# Patient Record
Sex: Male | Born: 1945 | Race: White | Hispanic: No | Marital: Married | Smoking: Former smoker
Health system: Southern US, Community
[De-identification: ages and names within clinical notes are randomized; demographics above are authoritative.]

## PROBLEM LIST (undated history)

## (undated) DIAGNOSIS — I10 Essential (primary) hypertension: Secondary | ICD-10-CM

## (undated) DIAGNOSIS — N529 Male erectile dysfunction, unspecified: Secondary | ICD-10-CM

## (undated) HISTORY — DX: Male erectile dysfunction, unspecified: N52.9

## (undated) HISTORY — PX: EYE SURGERY: SHX253

---

## 2001-07-01 ENCOUNTER — Encounter: Admission: RE | Admit: 2001-07-01 | Discharge: 2001-09-29 | Payer: Self-pay | Admitting: Neurology

## 2001-07-15 ENCOUNTER — Ambulatory Visit (HOSPITAL_COMMUNITY): Admission: RE | Admit: 2001-07-15 | Discharge: 2001-07-15 | Payer: Self-pay | Admitting: Neurology

## 2001-07-15 ENCOUNTER — Encounter: Payer: Self-pay | Admitting: Neurology

## 2001-07-20 ENCOUNTER — Encounter: Payer: Self-pay | Admitting: Radiology

## 2001-07-20 ENCOUNTER — Ambulatory Visit (HOSPITAL_COMMUNITY): Admission: RE | Admit: 2001-07-20 | Discharge: 2001-07-20 | Payer: Self-pay | Admitting: Radiology

## 2003-11-21 HISTORY — PX: HERNIA REPAIR: SHX51

## 2008-12-14 ENCOUNTER — Ambulatory Visit: Payer: Self-pay | Admitting: Family Medicine

## 2008-12-14 DIAGNOSIS — Z87891 Personal history of nicotine dependence: Secondary | ICD-10-CM | POA: Insufficient documentation

## 2008-12-15 ENCOUNTER — Encounter: Payer: Self-pay | Admitting: Family Medicine

## 2008-12-17 LAB — CONVERTED CEMR LAB
ALT: 21 units/L (ref 0–53)
AST: 14 units/L (ref 0–37)
Albumin: 4.6 g/dL (ref 3.5–5.2)
Alkaline Phosphatase: 76 units/L (ref 39–117)
BUN: 19 mg/dL (ref 6–23)
CO2: 23 meq/L (ref 19–32)
Calcium: 9.5 mg/dL (ref 8.4–10.5)
Chloride: 107 meq/L (ref 96–112)
Cholesterol: 184 mg/dL (ref 0–200)
Creatinine, Ser: 0.97 mg/dL (ref 0.40–1.50)
Glucose, Bld: 124 mg/dL — ABNORMAL HIGH (ref 70–99)
HDL: 50 mg/dL (ref >39)
LDL Cholesterol: 110 mg/dL — ABNORMAL HIGH (ref 0–99)
PSA, Free Pct: 23 — ABNORMAL LOW (ref >25)
PSA, Free: 0.3 ng/mL
PSA: 1.3 ng/mL (ref 0.10–4.00)
Potassium: 4.6 meq/L (ref 3.5–5.3)
Sodium: 142 meq/L (ref 135–145)
Total Bilirubin: 0.7 mg/dL (ref 0.3–1.2)
Total CHOL/HDL Ratio: 3.7
Total Protein: 7.1 g/dL (ref 6.0–8.3)
Triglycerides: 122 mg/dL (ref <150)
VLDL: 24 mg/dL (ref 0–40)

## 2008-12-25 ENCOUNTER — Encounter: Admission: RE | Admit: 2008-12-25 | Discharge: 2008-12-25 | Payer: Self-pay | Admitting: Family Medicine

## 2009-01-01 ENCOUNTER — Encounter: Payer: Self-pay | Admitting: Family Medicine

## 2009-01-01 DIAGNOSIS — D126 Benign neoplasm of colon, unspecified: Secondary | ICD-10-CM | POA: Insufficient documentation

## 2009-01-01 DIAGNOSIS — K5730 Diverticulosis of large intestine without perforation or abscess without bleeding: Secondary | ICD-10-CM | POA: Insufficient documentation

## 2009-02-15 ENCOUNTER — Ambulatory Visit: Payer: Self-pay | Admitting: Family Medicine

## 2009-02-15 LAB — CONVERTED CEMR LAB
Blood Glucose, Fasting: 126 mg/dL
Hgb A1c MFr Bld: 6.1 %

## 2009-03-05 ENCOUNTER — Ambulatory Visit: Payer: Self-pay | Admitting: Family Medicine

## 2009-03-05 LAB — CONVERTED CEMR LAB: Blood Glucose, Fasting: 129 mg/dL

## 2009-05-07 ENCOUNTER — Ambulatory Visit: Payer: Self-pay | Admitting: Family Medicine

## 2009-05-07 LAB — CONVERTED CEMR LAB: Blood Glucose, AC Bkfst: 123 mg/dL

## 2009-06-07 ENCOUNTER — Telehealth: Payer: Self-pay | Admitting: Family Medicine

## 2009-09-06 ENCOUNTER — Ambulatory Visit: Payer: Self-pay | Admitting: Family Medicine

## 2009-09-06 LAB — CONVERTED CEMR LAB: Blood Glucose, AC Bkfst: 120 mg/dL

## 2010-01-07 ENCOUNTER — Ambulatory Visit: Payer: Self-pay | Admitting: Family Medicine

## 2010-01-07 DIAGNOSIS — E119 Type 2 diabetes mellitus without complications: Secondary | ICD-10-CM | POA: Insufficient documentation

## 2010-01-07 LAB — CONVERTED CEMR LAB
Albumin/Creatinine Ratio, Urine, POC: <30
Blood Glucose, AC Bkfst: 129 mg/dL
Creatinine,U: 200 mg/dL
Hgb A1c MFr Bld: 6 %
Microalbumin U total vol: 30 mg/L

## 2010-01-10 LAB — CONVERTED CEMR LAB
Albumin: 4.4 g/dL (ref 3.5–5.2)
CO2: 25 meq/L (ref 19–32)
Cholesterol: 175 mg/dL (ref 0–200)
Glucose, Bld: 123 mg/dL — ABNORMAL HIGH (ref 70–99)
Sodium: 142 meq/L (ref 135–145)
Total Bilirubin: 0.5 mg/dL (ref 0.3–1.2)
Total Protein: 6.7 g/dL (ref 6.0–8.3)
Triglycerides: 201 mg/dL — ABNORMAL HIGH (ref <150)
VLDL: 40 mg/dL (ref 0–40)

## 2010-09-20 ENCOUNTER — Ambulatory Visit: Payer: Self-pay | Admitting: Family Medicine

## 2010-09-21 LAB — CONVERTED CEMR LAB
BUN: 15 mg/dL (ref 6–23)
CO2: 29 meq/L (ref 19–32)
Chloride: 102 meq/L (ref 96–112)
Glucose, Bld: 116 mg/dL — ABNORMAL HIGH (ref 70–99)
Potassium: 5.1 meq/L (ref 3.5–5.3)

## 2010-10-05 ENCOUNTER — Telehealth: Payer: Self-pay | Admitting: Family Medicine

## 2010-11-23 ENCOUNTER — Ambulatory Visit: Admit: 2010-11-23 | Payer: Self-pay | Admitting: Family Medicine

## 2010-12-20 NOTE — Assessment & Plan Note (Signed)
Summary: f/u DM   Vital Signs:  Patient profile:   65 year old male Height:      70.25 inches Weight:      171 pounds BMI:     20.16 O2 Sat:      96 % on Room air Pulse rate:   66 / minute BP sitting:   119 / 79  (left arm) Cuff size:   regular  Vitals Entered By: Payton Spark CMA (September 20, 2010 8:30 AM)  O2 Flow:  Room air CC: Overdue for OV and fasting labs.    Primary Care Provider:  Seymour Bars DO  CC:  Overdue for OV and fasting labs. .  History of Present Illness: 65 yo WM presents for f/u T2DM.  He was started on Metformin 2 x a day this year.  He has fair diet control.  His AM fastings are 100-120.  He is not exercising much.  Denies blurry vision or tingling in his hands or feet.  No frequent urination.  Had a normal eye exam 8 mos ago.  Denies CP but has DOE.  He is smoking 1 ppd.  He is not ready to quit.    Has not had any stress testing done or PAD screening.    Current Medications (verified): 1)  Inderal La 60 Mg Xr24h-Cap (Propranolol Hcl) .Marland Kitchen.. 1 Tab By Mouth Daily 2)  Viagra 100 Mg Tabs (Sildenafil Citrate) .Marland Kitchen.. 1 Tab By Mouth X 1 Prn 3)  Adult Aspirin Ec Low Strength 81 Mg Tbec (Aspirin) .... Take 1 Tablet By Mouth Once A Day 4)  Mens Multivitamin Plus  Tabs (Multiple Vitamins-Minerals) .... Take 1 Tablet By Mouth Once A Day 5)  Glucometer of Choice With Test Strips .... Use As Directed 6)  Accu-Chek Multiclix Lancets  Misc (Lancets) .... Use As Directed Once Daily 7)  Accu-Check Aviva Test Strips .... Use Daily As Directed 8)  Metformin Hcl 850 Mg Tabs (Metformin Hcl) .Marland Kitchen.. 1 Tab By Mouth Two Times A Day  Allergies (verified): No Known Drug Allergies  Past History:  Past Medical History: Reviewed history from 01/07/2010 and no changes required. 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 Has a grown daughter in [place]. Smokes 1 ppd x 40 yrs.-- working on quitting.   Walks 45 min 4  x a wk.  Review of Systems      See HPI  Physical Exam  General:  alert, well-developed, well-nourished, and well-hydrated.   Head:  normocephalic and atraumatic.   Eyes:  pupils equal, pupils round, and pupils reactive to light.   Mouth:  pharynx pink and moist.   Neck:  no masses.  no audible carotid bruits Lungs:  Normal respiratory effort, chest expands symmetrically. Lungs are clear to auscultation, no crackles or wheezes. Heart:  Normal rate and regular rhythm. S1 and S2 normal without gallop, murmur, click, rub or other extra sounds. Extremities:  no E/C/C Skin:  color normal.    Diabetes Management Exam:    Foot Exam (with socks and/or shoes not present):       Sensory-Monofilament:          Left foot: normal          Right foot: normal       Inspection:          Left foot: normal          Right foot: normal   Impression &  Recommendations:  Problem # 1:  DM (ICD-250.00) Continue metformin.  Monofilament done today.  PNX and Flu shot given today.  BP at goal with hx of neg urine micro (not on ACEi). Eye exam UTD.  Continue to work on diabetic diet.  Will update BMP for new start Metformin. RTC for a CPE -- due for stress testing and PAD screening. His updated medication list for this problem includes:    Adult Aspirin Ec Low Strength 81 Mg Tbec (Aspirin) .Marland Kitchen... Take 1 tablet by mouth once a day    Metformin Hcl 850 Mg Tabs (Metformin hcl) .Marland Kitchen... 1 tab by mouth two times a day  Orders: T-Basic Metabolic Panel [phone]) T- Hemoglobin A1C [phone])  Labs Reviewed: Creat: 0.94 (01/07/2010)   Microalbumin: 30 (01/07/2010) Reviewed HgBA1c results: 6.0 (01/07/2010)  6.1 (02/15/2009)  Problem # 2:  SMOKER (ICD-305.1) Counseled again on smoking cessation x 5 min.  He is not ready to quit at this point. The following medications were removed from the medication list:    Chantix Starting Month Pak 0.5 Mg X 11 & 1 Mg X 42 Tabs (Varenicline tartrate) .Marland Kitchen... Take as  directed    Chantix Continuing Month Pak 1 Mg Tabs (Varenicline tartrate) .Marland Kitchen... Take as directed  Complete Medication List: 1)  Inderal La 60 Mg Xr24h-cap (Propranolol hcl) .Marland Kitchen.. 1 tab by mouth daily 2)  Adult Aspirin Ec Low Strength 81 Mg Tbec (Aspirin) .... Take 1 tablet by mouth once a day 3)  Mens Multivitamin Plus Tabs (Multiple vitamins-minerals) .... Take 1 tablet by mouth once a day 4)  Accu-chek Multiclix Lancets Misc (Lancets) .... Use as directed once daily 5)  Accu-check Aviva Test Strips  .... Use daily as directed 6)  Metformin Hcl 850 Mg Tabs (Metformin hcl) .Marland Kitchen.. 1 tab by mouth two times a day 7)  Lovastatin 20 Mg Tabs (Lovastatin) .Marland Kitchen.. 1 tab by mouth qhs  Other Orders: Pneumococcal Vaccine (29562) Admin 1st Vaccine (13086) Admin 1st Vaccine (57846) Flu Vaccine 65yrs + (96295)  Patient Instructions: 1)  BMP today. 2)  Will call you w/ results tomorrow. 3)  Add Lovastatin -- 1 tab each night for cholesterol. 4)  Pneumovax and Flu shot today. 5)  Cut back on smoking. 6)  Schedule a PHYSICAL/ EKG in 3 mos. Prescriptions: LOVASTATIN 20 MG TABS (LOVASTATIN) 1 tab by mouth qhs  #30 x 3   Entered and Authorized by:   Seymour Bars DO   Signed by:   Seymour Bars DO on 09/20/2010   Method used:   Electronically to        [facility] [phone]* (retail)       [address] [place], [place]  [address]       Ph: [phone]       Fax: [phone]   RxID:   [id number] ACCU-CHECK AVIVA TEST STRIPS use daily as directed  #30 x 6   Entered and Authorized by:   Seymour Bars DO   Signed by:   Seymour Bars DO on 09/20/2010   Method used:   Printed then faxed to ...       [address] [phone]* (retail)       [address] [place], [place]  [address]       Ph: [phone]       Fax: [phone]   RxID:   [id number] METFORMIN HCL 850 MG TABS (METFORMIN HCL)  1 tab by mouth two times a day  #60 x 6   Entered and Authorized by:   Seymour Bars DO   Signed by:   Seymour Bars DO on 09/20/2010   Method used:   Electronically to        [facility] [phone]* (retail)       [address] [place], [place]  [address]       Ph: [phone]       Fax: [phone]   RxID:   [id number]    Orders Added: 1)  T-Basic Metabolic Panel [phone] 2)  T- Hemoglobin A1C [83036-23375] 3)  Pneumococcal Vaccine [90732] 4)  Admin 1st Vaccine [90471] 5)  Admin 1st Vaccine [90471] 6)  Flu Vaccine 68yrs + [25956] 7)  Est. Patient Level IV [38756]   Immunizations Administered:  Pneumonia Vaccine:    Vaccine Type: Pneumovax    Site: right deltoid    Dose: 0.5 ml    Route: IM    Given by: Payton Spark CMA    Exp. Date: 02/06/2012    Lot #: [id number]    VIS given: 10/25/09 version given September 20, 2010.   Immunizations Administered:  Pneumonia Vaccine:    Vaccine Type: Pneumovax    Site: right deltoid    Dose: 0.5 ml    Route: IM    Given by: Payton Spark CMA    Exp. Date: 02/06/2012    Lot #: [id number]    VIS given: 10/25/09 version given September 20, 2010. Flu Vaccine Consent Questions     Do you have a history of severe allergic reactions to this vaccine? no    Any prior history of allergic reactions to egg and/or gelatin? no    Do you have a sensitivity to the preservative Thimersol? no    Do you have a past history of Guillan-Barre Syndrome? no    Do you currently have an acute febrile illness? no    Have you ever had a severe reaction to latex? no    Vaccine information given and explained to patient? yes    Are you currently pregnant? no    Lot Number:[id number]   Exp Date:05/20/2011   Site Given  Left Deltoid IMa    VIS given: 10/25/09 version given September 20, 2010.     Marland Kitchenlbflu

## 2010-12-20 NOTE — Progress Notes (Signed)
Summary: Wants Chantix  Phone Note Call from Patient Call back at Home Phone [phone]   Caller: Patient Call For: Seymour Bars DO Summary of Call: Pt calls and ask if you will rx him Chantix so he can stop smoking. Please advise Initial call taken by: Kathlene November LPN,  October 05, 2010 12:58 PM  Follow-up for Phone Call        pick up starting month pack for month 1 and continuing month pack for months 2-4.  Call if any problems. Follow-up by: Seymour Bars DO,  October 05, 2010 1:00 PM    New/Updated Medications: CHANTIX STARTING MONTH PAK 0.5 MG X 11 & 1 MG X 42 TABS (VARENICLINE TARTRATE) take as directed CHANTIX CONTINUING MONTH PAK 1 MG TABS (VARENICLINE TARTRATE) take as directed Prescriptions: CHANTIX CONTINUING MONTH PAK 1 MG TABS (VARENICLINE TARTRATE) take as directed  #1 x 2   Entered and Authorized by:   Seymour Bars DO   Signed by:   Seymour Bars DO on 10/05/2010   Method used:   Electronically to        [facility] [phone]* (retail)       [address] [place], [place]  [address]       Ph: [phone]       Fax: [phone]   RxID:   [id number] CHANTIX STARTING MONTH PAK 0.5 MG X 11 & 1 MG X 42 TABS (VARENICLINE TARTRATE) take as directed  #1 x 0   Entered and Authorized by:   Seymour Bars DO   Signed by:   Seymour Bars DO on 10/05/2010   Method used:   Electronically to        [facility] [phone]* (retail)       [address] [place], [place]  [address]       Ph: [phone]       Fax: [phone]   RxID:   [id number]   Appended Document: Wants Chantix 10/05/2010- Pt notifeid of above instructions and that med sent to pharmacy. KJ LPN

## 2010-12-20 NOTE — Assessment & Plan Note (Signed)
Summary: DM/ smoking   Vital Signs:  Patient profile:   65 year old male Height:      70.25 inches Weight:      173 pounds BMI:     24.74 O2 Sat:      95 % on Room air Temp:     98.5 degrees F oral Pulse rate:   72 / minute BP sitting:   115 / 71  (right arm) Cuff size:   regular  Vitals Entered BySelena Batten Johnson/April (January 07, 2010 8:09 AM)  O2 Flow:  Room air CC: f/u on meds   Primary Care Provider:  Seymour Bars DO  CC:  f/u on meds.  History of Present Illness: 65 yo WM presents for f/u pre-diabetes.  He admits to not making any changes in his diet.  Plans to start exercising outside more.  He tried Chantix for 2-3 months.  He did well while on it but has relapsed back to 1/3 ppd.  He wants to try to quit smoking again.  His fasting sugars has been running 110s.  He has been taking Metformin 500 mg two times a day.  He denies CP or DOE.  Had a dilated eye exam in [place] 3 mos ago.  He denies blurry vision, polyuria or paresthesias.    Current Medications (verified): 1)  Inderal La 60 Mg Xr24h-Cap (Propranolol Hcl) .Marland Kitchen.. 1 Tab By Mouth Daily 2)  Viagra 100 Mg Tabs (Sildenafil Citrate) .Marland Kitchen.. 1 Tab By Mouth X 1 Prn 3)  Adult Aspirin Ec Low Strength 81 Mg Tbec (Aspirin) .... Take 1 Tablet By Mouth Once A Day 4)  Mens Multivitamin Plus  Tabs (Multiple Vitamins-Minerals) .... Take 1 Tablet By Mouth Once A Day 5)  Glucometer of Choice With Test Strips .... Use As Directed 6)  Accu-Chek Multiclix Lancets  Misc (Lancets) .... Use As Directed Once Daily 7)  Accu-Check Aviva Test Strips .... Use Daily As Directed 8)  Chantix Starting Month Pak 0.5 Mg X 11 & 1 Mg X 42 Tabs (Varenicline Tartrate) .... Take As Directed 9)  Chantix Continuing Month Pak 1 Mg Tabs (Varenicline Tartrate) .... Take As Directed  Allergies (verified): No Known Drug Allergies  Past History:  Past Medical History: ED 40 pack year smoker T2DM  Past Surgical History: Reviewed history from 12/14/2008  and no changes required. hernia, double 2005  Family History: Reviewed history from 12/14/2008 and no changes required. father died of colon cancer at 72 mother died at 48 from PAD sister healthy  Social History: Reviewed history from 09/06/2009 and no changes required. 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 Smokes 1 ppd x 40 yrs.-- working on quitting.   Walks 45 min 4 x a wk.  Review of Systems      See HPI  Physical Exam  General:  alert, well-developed, well-nourished, and well-hydrated.   Head:  normocephalic and atraumatic.   Eyes:  wears glasses, pupils equal, pupils round, and pupils reactive to light.   Nose:  no nasal discharge.   Mouth:  pharynx pink and moist.   Neck:  no masses.   Lungs:  Normal respiratory effort, chest expands symmetrically. Lungs are clear to auscultation, no crackles or wheezes. Heart:  Normal rate and regular rhythm. S1 and S2 normal without gallop, murmur, click, rub or other extra sounds. Extremities:  no LE edema Skin:  color normal.   Cervical Nodes:  No lymphadenopathy noted Psych:  good eye  contact, not anxious appearing, and not depressed appearing.     Impression & Recommendations:  Problem # 1:  DM (ICD-250.00) Assessment New  New dx of DM.  AM fasting 129 with A1C of 6.0. Increase Metformin to 850 mg two times a day.  RFd test strips.  Urine microalbumin: normal. BP at goal.  LDL goal is <100. REcheck fasting labs. Encouraged nutritionist referral.  Pnuemovax and monofilament at f/u visit.  His updated medication list for this problem includes:    Adult Aspirin Ec Low Strength 81 Mg Tbec (Aspirin) .Marland Kitchen... Take 1 tablet by mouth once a day    Metformin Hcl 850 Mg Tabs (Metformin hcl) .Marland Kitchen... 1 tab by mouth two times a day  Orders: Fingerstick (36416) Hemoglobin A1C (83036) Glucose, (CBG) (81191) Urine Microalbumin (47829) T-Comprehensive Metabolic Panel (56213-08657) T-Lipid Profile  (84696-29528)  Problem # 2:  SMOKER (ICD-305.1) Counseled once again on smoking cessation.  RF Chantix. His updated medication list for this problem includes:    Chantix Starting Month Pak 0.5 Mg X 11 & 1 Mg X 42 Tabs (Varenicline tartrate) .Marland Kitchen... Take as directed    Chantix Continuing Month Pak 1 Mg Tabs (Varenicline tartrate) .Marland Kitchen... Take as directed  Complete Medication List: 1)  Inderal La 60 Mg Xr24h-cap (Propranolol hcl) .Marland Kitchen.. 1 tab by mouth daily 2)  Viagra 100 Mg Tabs (Sildenafil citrate) .Marland Kitchen.. 1 tab by mouth x 1 prn 3)  Adult Aspirin Ec Low Strength 81 Mg Tbec (Aspirin) .... Take 1 tablet by mouth once a day 4)  Mens Multivitamin Plus Tabs (Multiple vitamins-minerals) .... Take 1 tablet by mouth once a day 5)  Glucometer of Choice With Test Strips  .... Use as directed 6)  Accu-chek Multiclix Lancets Misc (Lancets) .... Use as directed once daily 7)  Accu-check Aviva Test Strips  .... Use daily as directed 8)  Chantix Starting Month Pak 0.5 Mg X 11 & 1 Mg X 42 Tabs (Varenicline tartrate) .... Take as directed 9)  Chantix Continuing Month Pak 1 Mg Tabs (Varenicline tartrate) .... Take as directed 10)  Metformin Hcl 850 Mg Tabs (Metformin hcl) .Marland Kitchen.. 1 tab by mouth two times a day  Other Orders: T-PSA ([id number])  Patient Instructions: 1)  Increase metformin to 850 mg two times a day. 2)  Work on diabetic diet and regular exercise. 3)  Check sugars AM fasting 80-110 is goal. 4)  A1c is 6.0. 5)  Will do pneumonia vaccine next visit and set up for nutrition visit. 6)  Return for f/u with glucose log in 4 mos. 7)  BP at goal. 8)  Urine is normal. 9)  Update fasting labs today. 10)  Will call you w/ results on Monday. Prescriptions: CHANTIX STARTING MONTH PAK 0.5 MG X 11 & 1 MG X 42 TABS (VARENICLINE TARTRATE) take as directed  #1 pack x 0   Entered and Authorized by:   Seymour Bars DO   Signed by:   Seymour Bars DO on 01/07/2010   Method used:   Electronically to        [facility] [phone]* (retail)       [address] [place], [place]  [address]       Ph: [phone]       Fax: [phone]   RxID:   [id number] ACCU-CHECK AVIVA TEST STRIPS use daily as directed  #60 x 6   Entered and Authorized by:   Seymour Bars DO  Signed by:   Seymour Bars DO on 01/07/2010   Method used:   Printed then faxed to ...       [address] [phone]* (retail)       [address] [place], [place]  [address]       Ph: [phone]       Fax: [phone]   RxID:   [id number] METFORMIN HCL 850 MG TABS (METFORMIN HCL) 1 tab by mouth two times a day  #60 x 3   Entered and Authorized by:   Seymour Bars DO   Signed by:   Seymour Bars DO on 01/07/2010   Method used:   Electronically to        [facility] [phone]* (retail)       [address] [place], [place]  [address]       Ph: [phone]       Fax: [phone]   RxID:   [id number] INDERAL LA 60 MG XR24H-CAP (PROPRANOLOL HCL) 1 tab by mouth daily  #90 x 2   Entered and Authorized by:   Seymour Bars DO   Signed by:   Seymour Bars DO on 01/07/2010   Method used:   Electronically to        [facility] [phone]* (retail)       [address] [place], [place]  [address]       Ph: [phone]       Fax: [phone]   RxID:   [id number]   Laboratory Results   Urine Tests    Microalbumin (urine): 30 mg/L Creatinine: 200mg /dL  A:C Ratio <36  Blood Tests     HGBA1C: 6.0%   (Normal Range: Non-Diabetic - 3-6%   Control Diabetic - 6-8%) CBG Fasting:: 129mg /dL

## 2011-01-02 ENCOUNTER — Encounter: Payer: Self-pay | Admitting: Family Medicine

## 2011-01-02 ENCOUNTER — Ambulatory Visit (INDEPENDENT_AMBULATORY_CARE_PROVIDER_SITE_OTHER): Payer: BC Managed Care – PPO | Admitting: Family Medicine

## 2011-01-02 DIAGNOSIS — E119 Type 2 diabetes mellitus without complications: Secondary | ICD-10-CM

## 2011-01-02 DIAGNOSIS — Z125 Encounter for screening for malignant neoplasm of prostate: Secondary | ICD-10-CM

## 2011-01-02 DIAGNOSIS — Z0000 Encounter for general adult medical examination without abnormal findings: Secondary | ICD-10-CM

## 2011-01-09 ENCOUNTER — Encounter: Payer: Self-pay | Admitting: Family Medicine

## 2011-01-10 LAB — CONVERTED CEMR LAB
ALT: 21 units/L (ref 0–53)
CO2: 27 meq/L (ref 19–32)
Calcium: 9.7 mg/dL (ref 8.4–10.5)
Chloride: 104 meq/L (ref 96–112)
Cholesterol: 144 mg/dL (ref 0–200)
Microalb Creat Ratio: 3.4 mg/g (ref 0.0–30.0)
Sodium: 141 meq/L (ref 135–145)
Total Protein: 6.6 g/dL (ref 6.0–8.3)
VLDL: 29 mg/dL (ref 0–40)

## 2011-01-11 NOTE — Assessment & Plan Note (Signed)
Summary: CPE   Vital Signs:  Patient profile:   65 year old male Height:      70.25 inches Weight:      176 pounds BMI:     25.16 O2 Sat:      98 % on Room air Pulse rate:   70 / minute BP sitting:   98 / 70  (left arm) Cuff size:   large  Vitals Entered By: Payton Spark CMA (January 02, 2011 9:11 AM)  O2 Flow:  Room air CC: CPE    History of Present Illness: 65 yo WM presents for CPE.    He has been taking Inderal for social anxiety but it is lowering his BP and he doesn't think it's helping. His Tdap was updated 2010. He updated his colonoscopy in 2010. Pneumovax done 09-2010. He is due for annual prostate cancer screening. His fasting labs are due after 01-07-2011. He sees an eye doctor annually.   He is checking his sugars at home with DM, AMs are 120s.   EKG today. Not exercising much.    Current Medications (verified): 1)  Inderal La 60 Mg Xr24h-Cap (Propranolol Hcl) .Marland Kitchen.. 1 Tab By Mouth Daily 2)  Adult Aspirin Ec Low Strength 81 Mg Tbec (Aspirin) .... Take 1 Tablet By Mouth Once A Day 3)  Mens Multivitamin Plus  Tabs (Multiple Vitamins-Minerals) .... Take 1 Tablet By Mouth Once A Day 4)  Accu-Chek Multiclix Lancets  Misc (Lancets) .... Use As Directed Once Daily 5)  Accu-Check Aviva Test Strips .... Use Daily As Directed 6)  Metformin Hcl 850 Mg Tabs (Metformin Hcl) .Marland Kitchen.. 1 Tab By Mouth Two Times A Day 7)  Lovastatin 20 Mg Tabs (Lovastatin) .Marland Kitchen.. 1 Tab By Mouth Qhs 8)  Chantix Starting Month Pak 0.5 Mg X 11 & 1 Mg X 42 Tabs (Varenicline Tartrate) .... Take As Directed 9)  Chantix Continuing Month Pak 1 Mg Tabs (Varenicline Tartrate) .... Take As Directed  Allergies (verified): No Known Drug Allergies  Past History:  Past Medical History: Reviewed history from 01/07/2010 and no changes required. ED 40 pack year smoker T2DM  Past Surgical History: Reviewed history from 12/14/2008 and no changes required. hernia, double 2005  Family  History: Reviewed history from 12/14/2008 and no changes required. 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 [place] grown [place]. Smokes 1 ppd x 40 yrs.-- working on quitting.   Walks 45 min 4 x a wk.  Review of Systems  The patient denies anorexia, fever, weight loss, weight gain, vision loss, decreased hearing, hoarseness, chest pain, syncope, dyspnea on exertion, peripheral edema, prolonged cough, headaches, hemoptysis, abdominal pain, melena, hematochezia, severe indigestion/heartburn, hematuria, incontinence, genital sores, muscle weakness, suspicious skin lesions, transient blindness, difficulty walking, depression, unusual weight change, abnormal bleeding, enlarged lymph nodes, angioedema, breast masses, and testicular masses.    Physical Exam  General:  alert, well-developed, well-nourished, and well-hydrated.   Head:  normocephalic and atraumatic.   Eyes:  pupils equal, pupils round, and pupils reactive to light.   Ears:  no external deformities.   Nose:  no nasal discharge.   Mouth:  good dentition and pharynx pink and moist.   Neck:  no masses.  no carotid bruits Lungs:  Normal respiratory effort, chest expands symmetrically. Lungs are clear to auscultation, no crackles or wheezes. Heart:  Normal rate and regular rhythm. S1 and S2 normal without gallop, murmur, click, rub or other extra sounds. Abdomen:  soft, non-tender, normal bowel sounds, no distention, no masses, no guarding, no hepatomegaly, and no splenomegaly.  no AA bruits Rectal:  No external abnormalities noted. Normal sphincter tone. No rectal masses or tenderness. hemoccult neg. Prostate:  no nodules, no asymmetry, no induration, and 1+ enlarged.   Pulses:  2+ radial and pedal pulses Extremities:  no LE edema Skin:  color normal and  no rashes.   Cervical Nodes:  No lymphadenopathy noted Psych:  good eye contact, not anxious appearing, and not depressed appearing.     Impression & Recommendations:  Problem # 1:  HEALTH MAINTENANCE EXAM (ICD-V70.0) Keeping healthy checklist for men reviewed.  BP low-- will stop Inderall (started for social anxiety). BMI 25= normal.   On last month of Chantix for smoking cessation -- doing well! Update fasting labs after 2-18. A1C 5.9= perfect. DRE today; PSA with labs. Immunizations UTD. Colonoscopy done 2010, 2015 needs repeat. EKG today NSR at 64 bpm, no arrythmia or ischemic changes.  F/u for anxiety/ DM in 4 mos.  Complete Medication List: 1)  Adult Aspirin Ec Low Strength 81 Mg Tbec (Aspirin) .... Take 1 tablet by mouth once a day 2)  Mens Multivitamin Plus Tabs (Multiple vitamins-minerals) .... Take 1 tablet by mouth once a day 3)  Accu-chek Multiclix Lancets Misc (Lancets) .... Use as directed once daily 4)  Accu-check Aviva Test Strips  .... Use daily as directed 5)  Metformin Hcl 850 Mg Tabs (Metformin hcl) .Marland Kitchen.. 1 tab by mouth two times a day 6)  Lovastatin 20 Mg Tabs (Lovastatin) .Marland Kitchen.. 1 tab by mouth qhs 7)  Chantix Starting Month Pak 0.5 Mg X 11 & 1 Mg X 42 Tabs (Varenicline tartrate) .... Take as directed 8)  Chantix Continuing Month Pak 1 Mg Tabs (Varenicline tartrate) .... Take as directed 9)  Alprazolam 0.5 Mg Tabs (Alprazolam) .Marland Kitchen.. 1 tab by mouth two times a day as needed anxiety  Other Orders: Fingerstick (36416) Hemoglobin A1C (83036) T-Urine Microalbumin w/creat. ratio [phone]) T-Comprehensive Metabolic Panel [phone]) T-Lipid Profile (84132-44010) EKG w/ Interpretation (93000) DRE (U7253)  Patient Instructions: 1)  Keep up the good work. 2)  Call insurance co. to see if they cover an abdominal aortic aneurysm ultrasound screening test. 3)  Update fasting labs 2-18 or after. 4)  Will call you w/ results. 5)  EKG perfect. 6)  Keep up  the good work with smoking cessation. 7)  Stop Inderal and use Alprazolam 1/2 to a full tab up to 2 x a day for anxiety. 8)  REturn for f/u in 4 mos. Prescriptions: ALPRAZOLAM 0.5 MG TABS (ALPRAZOLAM) 1 tab by mouth two times a day as needed anxiety  #60 x 0   Entered and Authorized by:   Seymour Bars DO   Signed by:   Seymour Bars DO on 01/02/2011   Method used:   Printed then faxed to ...       [address] [phone]* (retail)       [address] [place], [place]  [address]       Ph: [phone]       Fax: [phone]   RxID:   [id number]    Orders Added: 1)  Fingerstick [36416] 2)  Hemoglobin A1C [83036] 3)  T-Urine Microalbumin w/creat. ratio [[id number]] 4)  T-Comprehensive Metabolic Panel [80053-22900] 5)  T-Lipid Profile [  91478-29562] 6)  EKG w/ Interpretation [93000] 7)  DRE [G0102] 8)  Est. Patient age 54-64 [41]    Laboratory Results   Blood Tests     HGBA1C: 5.9%   (Normal Range: Non-Diabetic - 3-6%   Control Diabetic - 6-8%)

## 2011-01-20 ENCOUNTER — Telehealth: Payer: Self-pay | Admitting: Family Medicine

## 2011-01-26 NOTE — Progress Notes (Signed)
Summary: [facility] appt  Phone Note Call from Patient Call back at Home Phone [phone]   Caller: Patient Reason for Call: Referral Summary of Call: Pt wanted to let you know that he made an appt to see Dr. Loleta Chance w/[facility] on 02/16/11 to eval his tremors.  He states they are getting worse.  Pt states they may be faxing something to our office to notify you of appt as well.  When asked if he thought the fax was to request records, pt was unsure. Initial call taken by: Francee Piccolo CMA Duncan Dull),  January 20, 2011 2:29 PM  Follow-up for Phone Call        sounds great.  Victorino Dike, please call to see if they need a formal referral. Follow-up by: Seymour Bars DO,  January 20, 2011 2:44 PM

## 2011-03-13 ENCOUNTER — Encounter: Payer: Self-pay | Admitting: Family Medicine

## 2011-04-03 ENCOUNTER — Other Ambulatory Visit: Payer: Self-pay | Admitting: Family Medicine

## 2011-04-03 NOTE — Telephone Encounter (Signed)
#  1 - this is my patient, not Dr Shelah Lewandowsky. #2- pt is due for f/u diabetes visit with me.  OK to fill for 30 day supply local until he can get in with me.

## 2011-04-24 ENCOUNTER — Other Ambulatory Visit: Payer: Self-pay | Admitting: *Deleted

## 2011-04-24 MED ORDER — METFORMIN HCL 850 MG PO TABS: 850.0000 mg | ORAL_TABLET | Freq: Two times a day (BID) | ORAL | Status: DC

## 2011-05-03 ENCOUNTER — Ambulatory Visit (INDEPENDENT_AMBULATORY_CARE_PROVIDER_SITE_OTHER): Payer: BC Managed Care – PPO | Admitting: Family Medicine

## 2011-05-03 ENCOUNTER — Encounter: Payer: Self-pay | Admitting: Family Medicine

## 2011-05-03 VITALS — BP 130/81 | HR 69 | Ht 71.0 in | Wt 178.0 lb

## 2011-05-03 DIAGNOSIS — F17200 Nicotine dependence, unspecified, uncomplicated: Secondary | ICD-10-CM

## 2011-05-03 DIAGNOSIS — E119 Type 2 diabetes mellitus without complications: Secondary | ICD-10-CM

## 2011-05-03 LAB — POCT GLYCOSYLATED HEMOGLOBIN (HGB A1C): Hemoglobin A1C: 5.9

## 2011-05-03 MED ORDER — ACETIC ACID 2 % OT SOLN: 4.0000 [drp] | Freq: Three times a day (TID) | OTIC | Status: AC

## 2011-05-03 NOTE — Assessment & Plan Note (Signed)
Doing great on metformin.  Labs are UTD.  Continue Metformin 850 mg bid.  Consider reducing to 500 mg bid or 500 mg XR but his AM fastings are close to 120 now.  He is working on diet/ exercise.  Eye exam, urine micro and monofilament UTD.  RTC in 4 mos for f/u with flu shot.

## 2011-05-03 NOTE — Patient Instructions (Signed)
A1C Great at 5.9.  Keep up the good work.  Use drops in both ears for dryness and irritation.  Return for f/u in 4 months with flu shot

## 2011-05-03 NOTE — Assessment & Plan Note (Signed)
Congratulated him on quitting smokin 09-2010.

## 2011-05-03 NOTE — Progress Notes (Signed)
  Subjective:    Patient ID: Patrick Perkins, male    DOB: 1946/07/03, 65 y.o.   MRN: [id number]  HPI  65 yo WM presents for f/u T2DM.  He is doing well on Metformin 850 mg bid.  His AM fastings are 115.  He has fair dietary compliance.  He has seen the eye doctor.  No paresthesias.  No chest pain or SOB.  He has been off the cigarettes since Thanksgiving.  He is up to date on his labs.  Due for test strips.  He just retired from [employer].    He is seeing neuro for his essential tremor and the mysoline is working well.  BP 130/81  Pulse 69  Ht 5\' 11"  (1.803 m)  Wt 178 lb (80.74 kg)  BMI 24.83 kg/m2  SpO2 97%   Review of Systems  Constitutional: Negative for appetite change and fatigue.  Eyes: Negative for visual disturbance.  Respiratory: Negative for chest tightness and shortness of breath.   Cardiovascular: Negative for chest pain and palpitations.  Genitourinary: Negative for frequency.  Neurological: Negative for light-headedness.  Psychiatric/Behavioral: Negative for dysphoric mood. The patient is not nervous/anxious.        Objective:   Physical Exam  Constitutional: He appears well-developed and well-nourished. No distress.  HENT:  Mouth/Throat: Oropharynx is clear and moist.  Eyes: Pupils are equal, round, and reactive to light.  Neck: Neck supple. No thyromegaly present.  Cardiovascular: Normal rate, regular rhythm and normal heart sounds.   No murmur heard. Pulmonary/Chest: Effort normal and breath sounds normal. No respiratory distress. He has no wheezes.  Musculoskeletal: He exhibits no edema.  Lymphadenopathy:    He has no cervical adenopathy.  Psychiatric: He has a normal mood and affect.          Assessment & Plan:

## 2011-08-31 ENCOUNTER — Telehealth: Payer: Self-pay | Admitting: Family Medicine

## 2011-08-31 ENCOUNTER — Ambulatory Visit (INDEPENDENT_AMBULATORY_CARE_PROVIDER_SITE_OTHER): Payer: BC Managed Care – PPO | Admitting: Family Medicine

## 2011-08-31 ENCOUNTER — Encounter: Payer: Self-pay | Admitting: Family Medicine

## 2011-08-31 VITALS — BP 114/74 | HR 70 | Ht 71.0 in | Wt 177.0 lb

## 2011-08-31 DIAGNOSIS — E119 Type 2 diabetes mellitus without complications: Secondary | ICD-10-CM

## 2011-08-31 DIAGNOSIS — E1169 Type 2 diabetes mellitus with other specified complication: Secondary | ICD-10-CM | POA: Insufficient documentation

## 2011-08-31 DIAGNOSIS — G250 Essential tremor: Secondary | ICD-10-CM

## 2011-08-31 DIAGNOSIS — E785 Hyperlipidemia, unspecified: Secondary | ICD-10-CM | POA: Insufficient documentation

## 2011-08-31 DIAGNOSIS — Z23 Encounter for immunization: Secondary | ICD-10-CM

## 2011-08-31 LAB — POCT GLYCOSYLATED HEMOGLOBIN (HGB A1C): Hemoglobin A1C: 5.8

## 2011-08-31 MED ORDER — LOVASTATIN 20 MG PO TABS: 20.0000 mg | ORAL_TABLET | Freq: Every day | ORAL | Status: DC

## 2011-08-31 NOTE — Assessment & Plan Note (Signed)
Will maintain current mevacor.

## 2011-08-31 NOTE — Progress Notes (Signed)
  Subjective:    Patient ID: Patrick Perkins, male    DOB: 1946-04-01, 65 y.o.   MRN: [id number]  HPI Patrick Perkins is here for several issues. #1 hyperlipidemia he is on Mevacor now. He states that the Mevacor it isn't causing any problems he wasn't sure if he really needs to be on that medication for the last lab work in February we'll wean me to renew his lab work. #2 diabetes once again he's not sure why he's on the metformin. He is on 850 mg twice a day. His hemoglobin A1c is pending and will discuss with him the results of that once we get that back. Reports no trouble with the medication. #3 flu vaccination he doesn't have his flu vaccination. #4 he is on Mysoline for essential tremor. He states neurologist put him on some ice and that seems to be doing well he was of course continued a maximum of problem with that.   Review of Systems  All other systems reviewed and are negative.       BP 114/74  Pulse 70  Ht 5\' 11"  (1.803 m)  Wt 177 lb (80.287 kg)  BMI 24.69 kg/m2  SpO2 97% No Known Allergies History   Social History  . Marital Status: Married    Spouse Name: N/A    Number of Children: N/A  . Years of Education: N/A   Occupational History  . Not on file.   Social History Main Topics  . Smoking status: Never Smoker   . Smokeless tobacco: Not on file  . Alcohol Use: Not on file  . Drug Use: Not on file  . Sexually Active: Not on file   Other Topics Concern  . Not on file   Social History Narrative  . No narrative on file   to correct of note should be noted the patient was a smoker but stopped about a year and half ago Objective:   Physical Exam  Well-nourished well-developed white male in no acute distress. Nose normocephalic neck supple no adenopathy palpated lungs were clear to auscultation percussion cardiovascular S1-S2 regular with      Assessment & Plan:  #1 hyperlipidemia we'll continue him on his current dose of Mevacor. #2 diabetes  hemoglobin A1c 5.8 which is excellent we'll continue him on his Mevacor 850 twice a day but may consider moving reduced it to 500 twice a day  #3 flu vaccine will be given today  #4 essential tremor continue him on E-Mycin at this time. Results for orders placed in visit on 08/31/11  POCT GLYCOSYLATED HEMOGLOBIN (HGB A1C)      Component Value Range   Hemoglobin A1C 5.8

## 2011-08-31 NOTE — Telephone Encounter (Signed)
Pt called and said he was seen earlier this morning by Dr. Thurmond Butts.  He was suppose to get scripts for his metformin and lovastatin. Plan:  Reviewed pt chart.  Pt good with refills on his metformin and the lovastatin was sent to [facility] for #90/1 refill.  Pt informed. Jarvis Newcomer, LPN Domingo Dimes

## 2011-08-31 NOTE — Assessment & Plan Note (Signed)
Will continue current dose of .Metformin but if Hg A1c remains this good will consider as Dr Cathey Endow did to reduce the dosage.

## 2011-08-31 NOTE — Patient Instructions (Signed)
Patient will continue current medication.  Diabetes is under excellent control. Return fasting in 6 months for lab work.

## 2011-08-31 NOTE — Assessment & Plan Note (Signed)
Mysoline seems to be working well.

## 2011-09-04 DIAGNOSIS — Z23 Encounter for immunization: Secondary | ICD-10-CM

## 2011-10-24 ENCOUNTER — Telehealth: Payer: Self-pay | Admitting: Family Medicine

## 2011-10-24 NOTE — Telephone Encounter (Signed)
Patient left a message stating he needs a refill on his Metformin called into [facility]

## 2011-10-25 MED ORDER — METFORMIN HCL 850 MG PO TABS: 850.0000 mg | ORAL_TABLET | Freq: Two times a day (BID) | ORAL | Status: DC

## 2011-10-31 ENCOUNTER — Telehealth: Payer: Self-pay | Admitting: Family Medicine

## 2011-10-31 MED ORDER — METFORMIN HCL 850 MG PO TABS: 850.0000 mg | ORAL_TABLET | Freq: Two times a day (BID) | ORAL | Status: DC

## 2011-10-31 NOTE — Telephone Encounter (Signed)
This med was already sent on the 5th Dec but will resend again

## 2011-10-31 NOTE — Telephone Encounter (Signed)
Patient walk-in advised he has been calling our office to get a refill on meds and he has not heard anything from our office... Needs a refill for Metformin has no refills sent to [facility]

## 2012-01-31 ENCOUNTER — Telehealth: Payer: Self-pay | Admitting: Family Medicine

## 2012-01-31 DIAGNOSIS — E785 Hyperlipidemia, unspecified: Secondary | ICD-10-CM

## 2012-01-31 DIAGNOSIS — E119 Type 2 diabetes mellitus without complications: Secondary | ICD-10-CM

## 2012-01-31 NOTE — Telephone Encounter (Signed)
LMOM informing Pt  

## 2012-01-31 NOTE — Telephone Encounter (Signed)
Please call patient reminded him he needs to for his lab work. I will release the orders. He also is overdue for a followup for his diabetes.

## 2012-02-14 LAB — COMPLETE METABOLIC PANEL WITH GFR
ALT: 16 U/L (ref 0–53)
AST: 19 U/L (ref 0–37)
Albumin: 4.5 g/dL (ref 3.5–5.2)
Alkaline Phosphatase: 67 U/L (ref 39–117)
BUN: 15 mg/dL (ref 6–23)
CO2: 27 mEq/L (ref 19–32)
Calcium: 9.6 mg/dL (ref 8.4–10.5)
Chloride: 103 mEq/L (ref 96–112)
Creat: 0.98 mg/dL (ref 0.50–1.35)
GFR, Est African American: >89 mL/min
GFR, Est Non African American: 81 mL/min
Glucose, Bld: 112 mg/dL — ABNORMAL HIGH (ref 70–99)
Potassium: 4.9 mEq/L (ref 3.5–5.3)
Sodium: 140 mEq/L (ref 135–145)
Total Bilirubin: 0.5 mg/dL (ref 0.3–1.2)
Total Protein: 6.6 g/dL (ref 6.0–8.3)

## 2012-02-14 LAB — LIPID PANEL
Cholesterol: 144 mg/dL (ref 0–200)
HDL: 62 mg/dL (ref >39)
LDL Cholesterol: 60 mg/dL (ref 0–99)
Total CHOL/HDL Ratio: 2.3 Ratio
Triglycerides: 109 mg/dL (ref <150)
VLDL: 22 mg/dL (ref 0–40)

## 2012-02-15 LAB — CBC WITH DIFFERENTIAL/PLATELET
Eosinophils Absolute: 0.2 10*3/uL (ref 0.0–0.7)
Eosinophils Relative: 2 % (ref 0–5)
Hemoglobin: 14.6 g/dL (ref 13.0–17.0)
Lymphs Abs: 3 10*3/uL (ref 0.7–4.0)
MCH: 31.4 pg (ref 26.0–34.0)
MCHC: 32.7 g/dL (ref 30.0–36.0)
MCV: 96.1 fL (ref 78.0–100.0)
Monocytes Absolute: 0.5 10*3/uL (ref 0.1–1.0)
Monocytes Relative: 7 % (ref 3–12)
RBC: 4.65 MIL/uL (ref 4.22–5.81)

## 2012-02-17 ENCOUNTER — Other Ambulatory Visit: Payer: Self-pay | Admitting: Family Medicine

## 2012-02-23 ENCOUNTER — Encounter: Payer: Self-pay | Admitting: *Deleted

## 2012-02-29 ENCOUNTER — Encounter: Payer: Self-pay | Admitting: Family Medicine

## 2012-02-29 ENCOUNTER — Ambulatory Visit (INDEPENDENT_AMBULATORY_CARE_PROVIDER_SITE_OTHER): Payer: BC Managed Care – PPO | Admitting: Family Medicine

## 2012-02-29 VITALS — BP 121/74 | HR 78 | Ht 71.0 in | Wt 179.0 lb

## 2012-02-29 DIAGNOSIS — Z23 Encounter for immunization: Secondary | ICD-10-CM

## 2012-02-29 DIAGNOSIS — Z2911 Encounter for prophylactic immunotherapy for respiratory syncytial virus (RSV): Secondary | ICD-10-CM

## 2012-02-29 DIAGNOSIS — E785 Hyperlipidemia, unspecified: Secondary | ICD-10-CM

## 2012-02-29 DIAGNOSIS — E119 Type 2 diabetes mellitus without complications: Secondary | ICD-10-CM

## 2012-02-29 LAB — POCT GLYCOSYLATED HEMOGLOBIN (HGB A1C): Hemoglobin A1C: 5.9

## 2012-02-29 MED ORDER — PRIMIDONE 50 MG PO TABS: 50.0000 mg | ORAL_TABLET | Freq: Every day | ORAL | Status: DC

## 2012-02-29 MED ORDER — LOVASTATIN 20 MG PO TABS: 20.0000 mg | ORAL_TABLET | Freq: Every day | ORAL | Status: DC

## 2012-02-29 MED ORDER — METFORMIN HCL 850 MG PO TABS: 850.0000 mg | ORAL_TABLET | Freq: Two times a day (BID) | ORAL | Status: DC

## 2012-02-29 NOTE — Patient Instructions (Signed)
Varicella Virus Vaccine Live injection What is this medicine? VARICELLA VIRUS VACCINE (var uh SEL uh VAHY ruhs vak SEEN) is used to prevent infections of chickpox. This medicine may be used for other purposes; ask your health care provider or pharmacist if you have questions. What should I tell my health care provider before I take this medicine? They need to know if you have any of the following conditions: -blood disorders or disease -cancer like leukemia or lymphoma -immune system problems or therapy -infection with fever -recent immune globulin therapy -tuberculosis -an unusual or allergic reaction to vaccines, neomycin, gelatin, other medicines, foods, dyes, or preservatives -pregnant or trying to get pregnant -breast-feeding How should I use this medicine? This vaccine is for injection under the skin. It is given by a health care professional. A copy of Vaccine Information Statements will be given before each vaccination. Read this sheet carefully each time. The sheet may change frequently. Talk to your pediatrician regarding the use of this medicine in children. While this drug may be prescribed for children as young as 12 months of age for selected conditions, precautions do apply. Overdosage: If you think you have taken too much of this medicine contact a poison control center or emergency room at once. NOTE: This medicine is only for you. Do not share this medicine with others. What if I miss a dose? Keep appointments for follow-up (booster) doses as directed. It is important not to miss your dose. Call your doctor or health care professional if you are unable to keep an appointment. What may interact with this medicine? Do not take this medicine with any of the following medications: -adalimumab -anakinra -etanercept -infliximab -medicines that suppress your immune system This medicine may also interact with the following medications: -aspirin and aspirin-like  medicines -blood transfusions -immunoglobulins -medicines to treat cancer -steroid medicines like prednisone or cortisone This list may not describe all possible interactions. Give your health care provider a list of all the medicines, herbs, non-prescription drugs, or dietary supplements you use. Also tell them if you smoke, drink alcohol, or use illegal drugs. Some items may interact with your medicine. What should I watch for while using this medicine? Visit your doctor for regular check ups. This vaccine, like all vaccines, may not fully protect everyone. After receiving this vaccine it may be possible to pass chickenpox infection to others. For up to 6 weeks, avoid people with immune system problems, pregnant women who have not had chickenpox, and newborns of women who have not had chickenpox. Talk to your doctor for more information. Do not become pregnant for 3 months after taking this vaccine. Women should inform their doctor if they wish to become pregnant or think they might be pregnant. There is a potential for serious side effects to an unborn child. Talk to your health care professional or pharmacist for more information. What side effects may I notice from receiving this medicine? Side effects that you should report to your doctor or health care professional as soon as possible: -allergic reactions like skin rash, itching or hives, swelling of the face, lips, or tongue -breathing problems -extreme changes in behavior -feeling faint or lightheaded, falls -fever over 102 degrees F -pain, tingling, numbness in the hands or feet -redness, blistering, peeling or loosening of the skin, including inside the mouth -seizures -unusually weak or tired Side effects that usually do not require medical attention (report to your doctor or health care professional if they continue or are bothersome): -aches or pains -  chickenpox-like rash -diarrhea -low-grade fever under 102 degrees F -loss  of appetite -nausea, vomiting -redness, pain, swelling at site where injected -sleepy -trouble sleeping This list may not describe all possible side effects. Call your doctor for medical advice about side effects. You may report side effects to FDA at 1-800-FDA-1088. Where should I keep my medicine? This drug is given in a hospital or clinic and will not be stored at home. NOTE: This sheet is a summary. It may not cover all possible information. If you have questions about this medicine, talk to your doctor, pharmacist, or health care provider.  2012, Elsevier/Gold Standard. (08/03/2008 5:19:05 PM) 

## 2012-02-29 NOTE — Progress Notes (Signed)
Subjective:    Patient ID: Patrick Perkins, male    DOB: 23-Feb-1946, 66 y.o.   MRN: [id number]  HPI #1 diabetes followup he's on metformin daily #2 hyperlipidemia he is on Mevacor daily as well.  #3 health maintenance diabetic foot exam needs to be done today #4 immunization update. Reviewed his immunization records. He is going to need varicella vaccination.    Review of Systems  Constitutional: Negative.   All other systems reviewed and are negative.      BP 121/74  Pulse 78  Wt 179 lb (81.194 kg) Objective:   Physical Exam  Constitutional: He is oriented to person, place, and time. He appears well-developed and well-nourished.  HENT:  Head: Normocephalic and atraumatic.  Eyes: Pupils are equal, round, and reactive to light.  Neck: Normal range of motion. Neck supple.  Cardiovascular: Normal rate and regular rhythm.   Pulmonary/Chest: Effort normal and breath sounds normal.  Musculoskeletal: He exhibits no edema and no tenderness.       Diabetic foot exam was carried out today. Pulses intact fungal infections present in both feet involving multiple nails but no marked deformity of the nailbed. No signs of any wounds or ulcers and good range of motion present proprioceptive checked and found to be normal and intact.  Neurological: He is alert and oriented to person, place, and time. He has normal reflexes.  Skin: Skin is warm and dry.  Psychiatric: He has a normal mood and affect.          Results for orders placed in visit on 01/31/12  LIPID PANEL      Component Value Range   Cholesterol 144  0 - 200 (mg/dL)   Triglycerides 244  <010 (mg/dL)   HDL 62  >27 (mg/dL)   Total CHOL/HDL Ratio 2.3     VLDL 22  0 - 40 (mg/dL)   LDL Cholesterol 60  0 - 99 (mg/dL)  HEMOGLOBIN O5D      Component Value Range   Hemoglobin A1C 6.0 (*) <5.7 (%)   Mean Plasma Glucose 126 (*) <117 (mg/dL)  CBC WITH DIFFERENTIAL      Component Value Range   WBC 7.7  4.0 - 10.5 (K/uL)   RBC  4.65  4.22 - 5.81 (MIL/uL)   Hemoglobin 14.6  13.0 - 17.0 (g/dL)   HCT 66.4  40.3 - 47.4 (%)   MCV 96.1  78.0 - 100.0 (fL)   MCH 31.4  26.0 - 34.0 (pg)   MCHC 32.7  30.0 - 36.0 (g/dL)   RDW 25.9  56.3 - 87.5 (%)   Platelets 330  150 - 400 (K/uL)   Neutrophils Relative 52  43 - 77 (%)   Neutro Abs 4.0  1.7 - 7.7 (K/uL)   Lymphocytes Relative 39  12 - 46 (%)   Lymphs Abs 3.0  0.7 - 4.0 (K/uL)   Monocytes Relative 7  3 - 12 (%)   Monocytes Absolute 0.5  0.1 - 1.0 (K/uL)   Eosinophils Relative 2  0 - 5 (%)   Eosinophils Absolute 0.2  0.0 - 0.7 (K/uL)   Basophils Relative 0  0 - 1 (%)   Basophils Absolute 0.0  0.0 - 0.1 (K/uL)   Smear Review Criteria for review not met    COMPLETE METABOLIC PANEL WITH GFR      Component Value Range   Sodium 140  135 - 145 (mEq/L)   Potassium 4.9  3.5 - 5.3 (mEq/L)   Chloride  103  96 - 112 (mEq/L)   CO2 27  19 - 32 (mEq/L)   Glucose, Bld 112 (*) 70 - 99 (mg/dL)   BUN 15  6 - 23 (mg/dL)   Creat 1.61  0.96 - 0.45 (mg/dL)   Total Bilirubin 0.5  0.3 - 1.2 (mg/dL)   Alkaline Phosphatase 67  39 - 117 (U/L)   AST 19  0 - 37 (U/L)   ALT 16  0 - 53 (U/L)   Total Protein 6.6  6.0 - 8.3 (g/dL)   Albumin 4.5  3.5 - 5.2 (g/dL)   Calcium 9.6  8.4 - 40.9 (mg/dL)   GFR, Est African American >89     GFR, Est Non African American 81     Assessment & Plan:  #1 diabetes under excellent control. Continue the metformin no new change.  #2 hyperlipidemia. Mevacor as his cholesterol under great control continue with this treatment. #3 health maintenance. Diabetic foot exam carried out fungal infection of the nailbed present but no other abnormalities seen. Patient declined Oral antifungal treatment at this time.  #4 immunization update. Shingles vaccination to be given today. Return in 6 months for followup.  Addendum needs a urine at his return visit and a PSA done.Marland Kitchen

## 2012-03-12 ENCOUNTER — Ambulatory Visit (INDEPENDENT_AMBULATORY_CARE_PROVIDER_SITE_OTHER): Payer: BC Managed Care – PPO | Admitting: Family Medicine

## 2012-03-12 ENCOUNTER — Encounter: Payer: Self-pay | Admitting: Family Medicine

## 2012-03-12 VITALS — BP 131/79 | HR 69 | Ht 71.0 in | Wt 180.0 lb

## 2012-03-12 DIAGNOSIS — K4091 Unilateral inguinal hernia, without obstruction or gangrene, recurrent: Secondary | ICD-10-CM

## 2012-03-12 NOTE — Patient Instructions (Signed)
Inguinal Hernia, Adult  Muscles help keep everything in the body in its proper place. But if a weak spot in the muscles develops, something can poke through. That is called a hernia. When this happens in the lower part of the belly (abdomen), it is called an inguinal hernia. (It takes its name from a part of the body in this region called the inguinal canal.) A weak spot in the wall of muscles lets some fat or part of the small intestine bulge through. An inguinal hernia can develop at any age. Men get them more often than women.  CAUSES   In adults, an inguinal hernia develops over time.  · It can be triggered by:  · Suddenly straining the muscles of the lower abdomen.  · Lifting heavy objects.  · Straining to have a bowel movement. Difficult bowel movements (constipation) can lead to this.  · Constant coughing. This may be caused by smoking or lung disease.  · Being overweight.  · Being pregnant.  · Working at a job that requires long periods of standing or heavy lifting.  · Having had an inguinal hernia before.  One type can be an emergency situation. It is called a strangulated inguinal hernia. It develops if part of the small intestine slips through the weak spot and cannot get back into the abdomen. The blood supply can be cut off. If that happens, part of the intestine may die. This situation requires emergency surgery.  SYMPTOMS   Often, a small inguinal hernia has no symptoms. It is found when a healthcare provider does a physical exam. Larger hernias usually have symptoms.   · In adults, symptoms may include:  · A lump in the groin. This is easier to see when the person is standing. It might disappear when lying down.  · In men, a lump in the scrotum.  · Pain or burning in the groin. This occurs especially when lifting, straining or coughing.  · A dull ache or feeling of pressure in the groin.  · Signs of a strangulated hernia can include:  · A bulge in the groin that becomes very painful and tender to the  touch.  · A bulge that turns red or purple.  · Fever, nausea and vomiting.  · Inability to have a bowel movement or to pass gas.  DIAGNOSIS   To decide if you have an inguinal hernia, a healthcare provider will probably do a physical examination.  · This will include asking questions about any symptoms you have noticed.  · The healthcare provider might feel the groin area and ask you to cough. If an inguinal hernia is felt, the healthcare provider may try to slide it back into the abdomen.  · Usually no other tests are needed.  TREATMENT   Treatments can vary. The size of the hernia makes a difference. Options include:  · Watchful waiting. This is often suggested if the hernia is small and you have had no symptoms.  · No medical procedure will be done unless symptoms develop.  · You will need to watch closely for symptoms. If any occur, contact your healthcare provider right away.  · Surgery. This is used if the hernia is larger or you have symptoms.  · Open surgery. This is usually an outpatient procedure (you will not stay overnight in a hospital). An cut (incision) is made through the skin in the groin. The hernia is put back inside the abdomen. The weak area in the muscles is   then repaired by herniorrhaphy or hernioplasty. Herniorrhaphy: in this type of surgery, the weak muscles are sewn back together. Hernioplasty: a patch or mesh is used to close the weak area in the abdominal wall.  · Laparoscopy. In this procedure, a surgeon makes small incisions. A thin tube with a tiny video camera (called a laparoscope) is put into the abdomen. The surgeon repairs the hernia with mesh by looking with the video camera and using two long instruments.  HOME CARE INSTRUCTIONS   · After surgery to repair an inguinal hernia:  · You will need to take pain medicine prescribed by your healthcare provider. Follow all directions carefully.  · You will need to take care of the wound from the incision.  · Your activity will be  restricted for awhile. This will probably include no heavy lifting for several weeks. You also should not do anything too active for a few weeks. When you can return to work will depend on the type of job that you have.  · During "watchful waiting" periods, you should:  · Maintain a healthy weight.  · Eat a diet high in fiber (fruits, vegetables and whole grains).  · Drink plenty of fluids to avoid constipation. This means drinking enough water and other liquids to keep your urine clear or pale yellow.  · Do not lift heavy objects.  · Do not stand for long periods of time.  · Quit smoking. This should keep you from developing a frequent cough.  SEEK MEDICAL CARE IF:   · A bulge develops in your groin area.  · You feel pain, a burning sensation or pressure in the groin. This might be worse if you are lifting or straining.  · You develop a fever of more than 100.5° F (38.1° C).  SEEK IMMEDIATE MEDICAL CARE IF:   · Pain in the groin increases suddenly.  · A bulge in the groin gets bigger suddenly and does not go down.  · For men, there is sudden pain in the scrotum. Or, the size of the scrotum increases.  · A bulge in the groin area becomes red or purple and is painful to touch.  · You have nausea or vomiting that does not go away.  · You feel your heart beating much faster than normal.  · You cannot have a bowel movement or pass gas.  · You develop a fever of more than 102.0° F (38.9° C).  Document Released: 03/25/2009 Document Revised: 10/26/2011 Document Reviewed: 03/25/2009  [employer]® Patient Information ©2012 [employer].

## 2012-03-12 NOTE — Progress Notes (Signed)
  Subjective:    Patient ID: Patrick Perkins, male    DOB: 04/20/1946, 66 y.o.   MRN: [id number]  HPI  Patient is here because of pain in the right inguinal area. Patient has had a history of bilateral inguinal hernia repair before back in 2004 which they were able to do laparoscopically. About one to 2 weeks ago he started noticing irritation in the right inguinal area. He's not had any straining excess lifting and other than mowing his yard and playing some golf life has been good.  Review of Systems  Gastrointestinal: Positive for abdominal pain.  All other systems reviewed and are negative.     BP 131/79  Pulse 69  Ht 5\' 11"  (1.803 m)  Wt 180 lb (81.647 kg)  BMI 25.10 kg/m2  SpO2 99% Objective:   Physical Exam  Vitals reviewed. Constitutional: He appears well-developed and well-nourished.  HENT:  Head: Normocephalic.  Abdominal: A hernia is present. Hernia confirmed positive in the right inguinal area. Hernia confirmed negative in the left inguinal area.  Genitourinary:       Small R sided inguinal hernia   Lymphadenopathy:       Right: No inguinal adenopathy present.       Left: No inguinal adenopathy present.  Neurological: He is alert.      Assessment & Plan:   Suspected  Suspected R inguinal hernia. Will obtain a surgical consultation.

## 2012-05-04 ENCOUNTER — Other Ambulatory Visit: Payer: Self-pay | Admitting: Family Medicine

## 2012-08-23 ENCOUNTER — Other Ambulatory Visit: Payer: Self-pay | Admitting: *Deleted

## 2012-08-23 MED ORDER — LOVASTATIN 20 MG PO TABS: 20.0000 mg | ORAL_TABLET | Freq: Every day | ORAL | Status: DC

## 2012-08-23 MED ORDER — METFORMIN HCL 850 MG PO TABS: 850.0000 mg | ORAL_TABLET | Freq: Two times a day (BID) | ORAL | Status: DC

## 2012-08-29 ENCOUNTER — Telehealth: Payer: Self-pay | Admitting: *Deleted

## 2012-08-29 DIAGNOSIS — E119 Type 2 diabetes mellitus without complications: Secondary | ICD-10-CM

## 2012-08-29 DIAGNOSIS — Z0000 Encounter for general adult medical examination without abnormal findings: Secondary | ICD-10-CM

## 2012-08-29 DIAGNOSIS — E785 Hyperlipidemia, unspecified: Secondary | ICD-10-CM

## 2012-08-29 NOTE — Telephone Encounter (Signed)
Pt notified and labs faxed. KG LPN 

## 2012-08-29 NOTE — Telephone Encounter (Signed)
Pt has appt with you on Tuesday and would like to get labs before then. What labs do you want and diagnosis and I will enter them. Wants to go today or tomorrow to have done.

## 2012-08-29 NOTE — Telephone Encounter (Signed)
Laboratory orders have been done for the patient.

## 2012-08-29 NOTE — Addendum Note (Signed)
Addended by: Hassan Rowan on: 08/29/2012 10:39 AM   Modules accepted: Orders

## 2012-08-30 LAB — COMPREHENSIVE METABOLIC PANEL
ALT: 17 U/L (ref 0–53)
AST: 21 U/L (ref 0–37)
Albumin: 4.6 g/dL (ref 3.5–5.2)
BUN: 18 mg/dL (ref 6–23)
CO2: 24 mEq/L (ref 19–32)
Calcium: 9.7 mg/dL (ref 8.4–10.5)
Chloride: 106 mEq/L (ref 96–112)
Potassium: 5 mEq/L (ref 3.5–5.3)

## 2012-08-30 LAB — CBC WITH DIFFERENTIAL/PLATELET
Basophils Absolute: 0 10*3/uL (ref 0.0–0.1)
Basophils Relative: 1 % (ref 0–1)
Lymphocytes Relative: 38 % (ref 12–46)
MCHC: 35 g/dL (ref 30.0–36.0)
Neutro Abs: 3 10*3/uL (ref 1.7–7.7)
Neutrophils Relative %: 50 % (ref 43–77)
RDW: 13.9 % (ref 11.5–15.5)
WBC: 5.9 10*3/uL (ref 4.0–10.5)

## 2012-08-30 LAB — LIPID PANEL
HDL: 58 mg/dL (ref >39)
LDL Cholesterol: 51 mg/dL (ref 0–99)
Total CHOL/HDL Ratio: 2.3 Ratio
VLDL: 22 mg/dL (ref 0–40)

## 2012-08-30 LAB — TSH: TSH: 1.806 u[IU]/mL (ref 0.350–4.500)

## 2012-08-30 LAB — PSA, TOTAL AND FREE: PSA: 1.32 ng/mL (ref <=4.00)

## 2012-09-03 ENCOUNTER — Encounter: Payer: Self-pay | Admitting: Family Medicine

## 2012-09-03 ENCOUNTER — Ambulatory Visit (INDEPENDENT_AMBULATORY_CARE_PROVIDER_SITE_OTHER): Payer: Medicare Other | Admitting: Family Medicine

## 2012-09-03 VITALS — BP 123/77 | HR 81 | Ht 71.0 in | Wt 178.0 lb

## 2012-09-03 DIAGNOSIS — Z9181 History of falling: Secondary | ICD-10-CM

## 2012-09-03 DIAGNOSIS — Z23 Encounter for immunization: Secondary | ICD-10-CM

## 2012-09-03 DIAGNOSIS — Z1331 Encounter for screening for depression: Secondary | ICD-10-CM

## 2012-09-03 DIAGNOSIS — Z0000 Encounter for general adult medical examination without abnormal findings: Secondary | ICD-10-CM

## 2012-09-03 DIAGNOSIS — E119 Type 2 diabetes mellitus without complications: Secondary | ICD-10-CM

## 2012-09-03 DIAGNOSIS — E785 Hyperlipidemia, unspecified: Secondary | ICD-10-CM

## 2012-09-03 LAB — POCT URINALYSIS DIPSTICK
Bilirubin, UA: NEGATIVE
Blood, UA: NEGATIVE
Glucose, UA: NEGATIVE
Nitrite, UA: NEGATIVE
Spec Grav, UA: 1.025

## 2012-09-03 MED ORDER — LOVASTATIN 20 MG PO TABS: 20.0000 mg | ORAL_TABLET | Freq: Every day | ORAL | Status: DC

## 2012-09-03 MED ORDER — METFORMIN HCL 850 MG PO TABS: 850.0000 mg | ORAL_TABLET | Freq: Two times a day (BID) | ORAL | Status: DC

## 2012-09-03 MED ORDER — PRIMIDONE 50 MG PO TABS: 50.0000 mg | ORAL_TABLET | Freq: Every day | ORAL | Status: DC

## 2012-09-03 NOTE — Progress Notes (Signed)
Subjective:    Patient ID: Patrick Perkins, male    DOB: [birth year], 66 y.o.   MRN: [id number]  HPI #1 hyperlipidemia. Patient's followup for his cholesterol. #2 metabolic syndrome/diabetes. Patient also to follow up for blood sugar evaluation.  #3 health maintenance. Discussed need for ophthalmologic examination, pneumonia vaccination would still wait 5 years in between vaccinations. Eye examinations needed and UA. #4 fall assessment needed #5 depression screen needed #6 immunization flu as needed #7 patient had hernia repair done. He states that he was an active about 2-3 months in over it to once he did he's feeling much better.  Review of Systems  Constitutional: Positive for activity change.  All other systems reviewed and are negative.     BP 123/77  Pulse 81  Ht 5\' 11"  (1.803 m)  Wt 178 lb (80.74 kg)  BMI 24.83 kg/m2  SpO2 95% Objective:   Physical Exam  Constitutional: He is oriented to person, place, and time. He appears well-developed and well-nourished.  HENT:  Head: Normocephalic.  Neck: Normal range of motion. Neck supple.  Cardiovascular: Normal rate, regular rhythm and normal heart sounds.   Pulmonary/Chest: Effort normal and breath sounds normal.  Musculoskeletal: Normal range of motion.  Neurological: He is alert and oriented to person, place, and time.  Skin: Skin is warm and dry.  Psychiatric: He has a normal mood and affect.      Results for orders placed in visit on 08/29/12  COMPREHENSIVE METABOLIC PANEL      Component Value Range   Sodium 141  135 - 145 mEq/L   Potassium 5.0  3.5 - 5.3 mEq/L   Chloride 106  96 - 112 mEq/L   CO2 24  19 - 32 mEq/L   Glucose, Bld 83  70 - 99 mg/dL   BUN 18  6 - 23 mg/dL   Creat 4.09  8.11 - 9.14 mg/dL   Total Bilirubin 0.6  0.3 - 1.2 mg/dL   Alkaline Phosphatase 74  39 - 117 U/L   AST 21  0 - 37 U/L   ALT 17  0 - 53 U/L   Total Protein 6.7  6.0 - 8.3 g/dL   Albumin 4.6  3.5 - 5.2 g/dL   Calcium 9.7  8.4 -  78.2 mg/dL  CBC WITH DIFFERENTIAL      Component Value Range   WBC 5.9  4.0 - 10.5 K/uL   RBC 4.46  4.22 - 5.81 MIL/uL   Hemoglobin 14.3  13.0 - 17.0 g/dL   HCT 95.6  21.3 - 08.6 %   MCV 91.7  78.0 - 100.0 fL   MCH 32.1  26.0 - 34.0 pg   MCHC 35.0  30.0 - 36.0 g/dL   RDW 57.8  46.9 - 62.9 %   Platelets 347  150 - 400 K/uL   Neutrophils Relative 50  43 - 77 %   Neutro Abs 3.0  1.7 - 7.7 K/uL   Lymphocytes Relative 38  12 - 46 %   Lymphs Abs 2.3  0.7 - 4.0 K/uL   Monocytes Relative 8  3 - 12 %   Monocytes Absolute 0.5  0.1 - 1.0 K/uL   Eosinophils Relative 3  0 - 5 %   Eosinophils Absolute 0.2  0.0 - 0.7 K/uL   Basophils Relative 1  0 - 1 %   Basophils Absolute 0.0  0.0 - 0.1 K/uL   Smear Review Criteria for review not met    TSH  Component Value Range   TSH 1.806  0.350 - 4.500 uIU/mL  PSA, TOTAL AND FREE      Component Value Range   PSA 1.32  <=4.00 ng/mL   PSA, Free 0.34     PSA, Free Pct 26  >25 %  HEMOGLOBIN A1C      Component Value Range   Hemoglobin A1C 5.8 (*) <5.7 %   Mean Plasma Glucose 120 (*) <117 mg/dL  LIPID PANEL      Component Value Range   Cholesterol 131  0 - 200 mg/dL   Triglycerides 409  <811 mg/dL   HDL 58  >91 mg/dL   Total CHOL/HDL Ratio 2.3     VLDL 22  0 - 40 mg/dL   LDL Cholesterol 51  0 - 99 mg/dL   Results for orders placed in visit on 09/03/12  POCT URINALYSIS DIPSTICK      Component Value Range   Color, UA clear     Clarity, UA yellow     Glucose, UA neg     Bilirubin, UA neg     Ketones, UA 15     Spec Grav, UA 1.025     Blood, UA neg     pH, UA 5.5     Protein, UA neg     Urobilinogen, UA 0.2     Nitrite, UA neg     Leukocytes, UA Negative      Fall assesment 2.1 for age and 1 for medication. Depression screen 0.  Assessment & Plan:  #1 hyperlipidemia under excellent control continue the Mevacor.  #2 metabolic syndrome/elevated blood sugars. Under great control with A1c down to 5.8. #3 health maintenance. PSA  normal. We'll obtain urine for protein, and strongly recommend eye appointment.  #4 hernia repair. He is recovered and doing well at this time. #5 fall assessment done. #6 depression screen done. #7 immunization needs. Flu shot given.  Return in 6 months followup. Progress to. Her to [place] in [place] a

## 2012-09-03 NOTE — Patient Instructions (Signed)
Metabolic Syndrome, Adult Metabolic syndrome descibes a group of risk factors for heart disease and diabetes. This syndrome has other names including Insulin Resistance Syndrome. The more risk factors you have, the higher your risk of having a heart attack, stroke, or developing diabetes. These risk factors include:  High blood sugar.  High blood triglyceride (a fat found in the blood) level.  High blood pressure.  Abdominal obesity (your extra weight is around your waist instead of your hips).  Low levels of high-density lipoprotein, HDL (good blood cholesterol). If you have any three of these risk factors, you have metabolic syndrome. If you have even one of these factors, you should make lifestyle changes to improve your health in order to prevent serious health diseases.  In people with metabolic syndrome, the cells do not respond properly to insulin. This can lead to high levels of glucose in the blood, which can interfere with normal body processes. Eventually, this can cause high blood pressure and higher fat levels in the blood, and inflammation of your blood vessels. The result can be heart disease and stroke.  CAUSES   Eating a diet rich in calories and saturated fat.  Too little physical activity.  Being overweight. Other underlying causes are:  Family history (genetics).  Ethnicity (South Asians are at a higher risk).  Older age (your chances of developing metabolic syndrome are higher as you grow older).  Insulin resistance. SYMPTOMS  By itself, metabolic syndrome has no symptoms. However, you might have symptoms of diabetes (high blood sugar) or high blood pressure, such as:  Increased thirst, urination, and tiredness.  Dizzy spells.  Dull headaches that are unusual for you.  Blurred vision.  Nosebleeds. DIAGNOSIS  Your caregiver may make a diagnosis of metabolic syndrome if you have at least three of these factors:  If you are overweight mostly around the  waist. This means a waistline greater than 40" in men and more than 35" in women. The waistline limits are 31 to 35 inches for women and 37 to 39 inches for men. In those who have certain genetic risk factors, such as having a family history of diabetes or being of Asian descent.  If you have a blood pressure of 130/85 mm Hg or more, or if you are being treated for high blood pressure.  If your blood triglyceride level is 150 mg/dL or more, or you are being treated for high levels of triglyceride.  If the level of HDL in your blood is below 40 mg/dL in men, less than 50 mg/dL in women, or you are receiving treatment for low levels of HDL.  If the level of sugar in your blood is high with fasting blood sugar level of 110 mg/dL or more, or you are under treatment for diabetes. TREATMENT  Your caregiver may have you make lifestyle changes, which may include:  Exercise.  Losing weight.  Maintaining a healthy diet.  Quitting smoking. The lifestyle changes listed above are key in reducing your risk for heart disease and stroke. Medicines may also be prescribed to help your body respond to insulin better and to reduce your blood pressure and blood fat levels. Aspirin may be recommended to reduce risks of heart disease or stroke.  HOME CARE INSTRUCTIONS   Exercise.  Measure your waist at regular intervals just above the hipbones after you have breathed out.  Maintain a healthy diet.  Eat fruits, such as apples, oranges, and pears.  Eat vegetables.  Eat legumes, such as kidney   beans, peas, and lentils.  Eat food rich in soluble fiber, such as whole grain cereal, oatmeal, and oat bran.  Use olive or safflower oils and avoid saturated fats.  Eat nuts.  Limit the amount of salt you eat or add to food.  Limit the amount of alcohol you drink.  Include fish in your diet, if possible.  Stop smoking if you are a smoker.  Maintain regular follow-up appointments.  Follow your  caregiver's advice. SEEK MEDICAL CARE IF:   You feel very tired or fatigued.  You develop excessive thirst.  You pass large quantities of urine.  You are putting on weight around your waist rather than losing weight.  You develop headaches over and over again.  You have off-and-on dizzy spells. SEEK IMMEDIATE MEDICAL CARE IF:   You develop nosebleeds.  You develop sudden blurred vision.  You develop sudden dizzy spells.  You develop chest pains, trouble breathing, or feel an abnormal or irregular heart beat.  You have a fainting episode.  You develop any sudden trouble speaking and/or swallowing.  You develop sudden weakness in one arm and/or one leg. MAKE SURE YOU:   Understand these instructions.  Will watch your condition.  Will get help right away if you are not doing well or get worse. Document Released: 02/13/2008 Document Revised: 01/29/2012 Document Reviewed: 02/13/2008 [employer] Patient Information 2013 [employer].  

## 2012-09-17 ENCOUNTER — Telehealth: Payer: Self-pay | Admitting: *Deleted

## 2012-09-17 DIAGNOSIS — E119 Type 2 diabetes mellitus without complications: Secondary | ICD-10-CM

## 2012-09-17 MED ORDER — GLUCOSE BLOOD VI STRP: ORAL_STRIP | Status: DC

## 2012-09-17 NOTE — Telephone Encounter (Signed)
Test strips sent to pharm

## 2013-01-20 ENCOUNTER — Encounter: Payer: Self-pay | Admitting: Family Medicine

## 2013-01-20 ENCOUNTER — Ambulatory Visit (INDEPENDENT_AMBULATORY_CARE_PROVIDER_SITE_OTHER): Payer: Medicare Other | Admitting: Family Medicine

## 2013-01-20 VITALS — BP 132/74 | HR 85 | Temp 98.0°F | Wt 180.0 lb

## 2013-01-20 DIAGNOSIS — B9689 Other specified bacterial agents as the cause of diseases classified elsewhere: Secondary | ICD-10-CM

## 2013-01-20 DIAGNOSIS — J329 Chronic sinusitis, unspecified: Secondary | ICD-10-CM

## 2013-01-20 DIAGNOSIS — A499 Bacterial infection, unspecified: Secondary | ICD-10-CM

## 2013-01-20 MED ORDER — MOMETASONE FUROATE 50 MCG/ACT NA SUSP: NASAL | Status: DC

## 2013-01-20 MED ORDER — AMOXICILLIN-POT CLAVULANATE 500-125 MG PO TABS: ORAL_TABLET | ORAL | Status: AC

## 2013-01-20 NOTE — Progress Notes (Signed)
CC: Patrick Perkins is a 67 y.o. male is here for Sore Throat   Subjective: HPI:  Patient complains of nasal congestion and sore throat for one week. Nasal congestion is described as moderate in severity and worsening on a daily basis. Sore throat is mild in severity and improving since onset.  Symptoms have not been relieved using DayQuil and NyQuil, no other interventions. Symptoms seem to be worse first thing in the morning and late at night. Has had a mild nonproductive cough. Denies fevers, chills, shortness of breath, orthopnea, dizziness, headaches, nor rashes.  Patient reports right groin pain last week that has significantly improved since onset. Occurred after taking boots off. He would like to know I can check him for hernia. He denies pain with Valsalva nor any change in his stooling habits. Denies testicular pain  Review Of Systems Outlined In HPI  Past Medical History  Diagnosis Date  . ED (erectile dysfunction)   . Diabetes mellitus      Family History  Problem Relation Age of Onset  . Cancer Father 63    colon     History  Substance Use Topics  . Smoking status: Former Smoker    Quit date: 09/30/2009  . Smokeless tobacco: Not on file  . Alcohol Use: Not on file     Objective: Filed Vitals:   01/20/13 0958  BP: 132/74  Pulse: 85  Temp: 98 F (36.7 C)    General: Alert and Oriented, No Acute Distress HEENT: Pupils equal, round, reactive to light. Conjunctivae clear.  External ears unremarkable, canals clear with intact TMs with appropriate landmarks.  Middle ear appears open without effusion. Boggy and erythematous inferior turbinates with moderate mucoid discharge.  Moist mucous membranes, pharynx without inflammation nor lesions.  Neck supple without palpable lymphadenopathy nor abnormal masses. Maxillary sinus tenderness to percussion Lungs: Clear to auscultation bilaterally, no wheezing/ronchi/rales.  Comfortable work of breathing. Good air  movement. Cardiac: Regular rate and rhythm. Normal S1/S2.  No murmurs, rubs, nor gallops.   There is no bulging in the right groin nor any hernia appreciated in the inguinal canal. Skin: Warm and dry.  Assessment & Plan: Chanc was seen today for sore throat.  Diagnoses and associated orders for this visit:  Bacterial sinusitis - amoxicillin-clavulanate (AUGMENTIN) 500-125 MG per tablet; Take one by mouth every 8 hours for ten total days. - mometasone (NASONEX) 50 MCG/ACT nasal spray; Two sprays each nostril daily.    Bacterial sinusitis Start Augmentin and Nasonex. Nasal saline washes as needed Right groin pain: Discussed with patient that there is no evidence for hernia at this time.Signs and symptoms requring emergent/urgent reevaluation were discussed with the patient.  Return if symptoms worsen or fail to improve.

## 2013-02-24 ENCOUNTER — Telehealth: Payer: Self-pay | Admitting: *Deleted

## 2013-02-24 DIAGNOSIS — Z5181 Encounter for therapeutic drug level monitoring: Secondary | ICD-10-CM | POA: Insufficient documentation

## 2013-02-24 DIAGNOSIS — E119 Type 2 diabetes mellitus without complications: Secondary | ICD-10-CM

## 2013-02-24 DIAGNOSIS — E785 Hyperlipidemia, unspecified: Secondary | ICD-10-CM

## 2013-02-24 DIAGNOSIS — Z79899 Other long term (current) drug therapy: Secondary | ICD-10-CM

## 2013-02-24 NOTE — Telephone Encounter (Signed)
Pt notified and labs faxed 

## 2013-02-24 NOTE — Telephone Encounter (Signed)
Pt has a six month f/u scheduled with Dr. Ivan Anchors next week. Pt would like to have order for blood work sent to the lab

## 2013-02-24 NOTE — Telephone Encounter (Signed)
Patrick Perkins, Labs have been printed and placed in your inbox, ready for pickup prior to him going to lab.  Please ask him to do these in a fasting state.

## 2013-02-26 LAB — LIPID PANEL
Cholesterol: 148 mg/dL (ref 0–200)
HDL: 63 mg/dL (ref >39)
LDL Cholesterol: 58 mg/dL (ref 0–99)
Triglycerides: 136 mg/dL (ref <150)
VLDL: 27 mg/dL (ref 0–40)

## 2013-02-26 LAB — COMPLETE METABOLIC PANEL WITH GFR
Albumin: 4.4 g/dL (ref 3.5–5.2)
Alkaline Phosphatase: 70 U/L (ref 39–117)
BUN: 12 mg/dL (ref 6–23)
CO2: 27 mEq/L (ref 19–32)
Calcium: 9.3 mg/dL (ref 8.4–10.5)
GFR, Est African American: >89 mL/min
GFR, Est Non African American: 84 mL/min
Glucose, Bld: 115 mg/dL — ABNORMAL HIGH (ref 70–99)
Potassium: 4.5 mEq/L (ref 3.5–5.3)
Total Protein: 6.7 g/dL (ref 6.0–8.3)

## 2013-03-03 ENCOUNTER — Encounter: Payer: Self-pay | Admitting: Family Medicine

## 2013-03-03 ENCOUNTER — Ambulatory Visit: Payer: TRICARE For Life (TFL) | Admitting: Physician Assistant

## 2013-03-03 ENCOUNTER — Ambulatory Visit (INDEPENDENT_AMBULATORY_CARE_PROVIDER_SITE_OTHER): Payer: Medicare PPO | Admitting: Family Medicine

## 2013-03-03 VITALS — BP 126/77 | HR 72 | Ht 71.0 in | Wt 183.0 lb

## 2013-03-03 DIAGNOSIS — E785 Hyperlipidemia, unspecified: Secondary | ICD-10-CM

## 2013-03-03 DIAGNOSIS — E119 Type 2 diabetes mellitus without complications: Secondary | ICD-10-CM

## 2013-03-03 MED ORDER — METFORMIN HCL 850 MG PO TABS: 850.0000 mg | ORAL_TABLET | Freq: Two times a day (BID) | ORAL | Status: DC

## 2013-03-03 MED ORDER — LOVASTATIN 20 MG PO TABS: 20.0000 mg | ORAL_TABLET | Freq: Every day | ORAL | Status: DC

## 2013-03-03 NOTE — Progress Notes (Signed)
CC: Patrick Perkins is a 67 y.o. male is here for Hyperlipidemia and f/u labs   Subjective: HPI:  Followup hyperlipidemia: Has been taking Mevacor 20 mg on a daily basis. Denies right upper quadrant pain, skin or scleral discoloration, myalgias. There is mild to moderate activity on a daily basis, he tries to watch what he eats. Denies chest pain, shortness of breath, limb claudication.  Followup type 2 diabetes: Has been taking metformin twice a day without GI disturbance. Denies tingling or numbness of extremities, burning of the extremities, poorly healing wounds, vision loss, polyphagia polydipsia nor polyuria   Review Of Systems Outlined In HPI  Past Medical History  Diagnosis Date  . ED (erectile dysfunction)   . Diabetes mellitus      Family History  Problem Relation Age of Onset  . Cancer Father 49    colon     History  Substance Use Topics  . Smoking status: Former Smoker    Quit date: 09/30/2009  . Smokeless tobacco: Not on file  . Alcohol Use: Not on file     Objective: Filed Vitals:   03/03/13 0824  BP: 126/77  Pulse: 72    Vital signs reviewed. General: Alert and Oriented, No Acute Distress HEENT: Pupils equal, round, reactive to light. Conjunctivae clear.  External ears unremarkable.  Moist mucous membranes. Lungs: Clear and comfortable work of breathing, speaking in full sentences without accessory muscle use. Cardiac: Regular rate and rhythm.  Neuro: CN II-XII grossly intact, gait normal. Extremities: No peripheral edema.  Strong peripheral pulses.  Diabetic Foot Exam: Dorsalis pedis pulses 1+ bilaterally.  Monofilament sensation intact on plantar and dorsal surface bilaterally.  No signs of infection, skin breakdown, nor ulceration. Mental Status: No depression, anxiety, nor agitation. Logical though process. Skin: Warm and dry.  Assessment & Plan: Patrick Perkins was seen today for hyperlipidemia and f/u labs.  Diagnoses and associated orders for this  visit:  Hyperlipemia - lovastatin (MEVACOR) 20 MG tablet; Take 1 tablet (20 mg total) by mouth at bedtime.  DM - metFORMIN (GLUCOPHAGE) 850 MG tablet; Take 1 tablet (850 mg total) by mouth 2 (two) times daily with a meal.    Hyperlipidemia: Controlled, LDL well below 100 and HDL over 60, continue statin. Liver enzymes within normal limits. Type 2 diabetes: Controlled, A1c of 5.9, great control, rediscussed dietary and exercise interventions to continue progress.  Continue metformin and aspirin.  Return in about 3 months (around 06/02/2013).

## 2013-03-04 ENCOUNTER — Ambulatory Visit: Payer: TRICARE For Life (TFL) | Admitting: Family Medicine

## 2013-05-22 ENCOUNTER — Telehealth: Payer: Self-pay | Admitting: *Deleted

## 2013-05-22 NOTE — Telephone Encounter (Signed)
Pt has appointment with you on the 14th and would like to get his labs done Monday so they will be ready to go over at hiws appointment. What would you like to draw and diagnosis and I can put them in. Barry Dienes, LPN

## 2013-05-26 ENCOUNTER — Telehealth: Payer: Self-pay | Admitting: *Deleted

## 2013-05-26 DIAGNOSIS — E785 Hyperlipidemia, unspecified: Secondary | ICD-10-CM

## 2013-05-26 LAB — LIPID PANEL
Cholesterol: 149 mg/dL (ref 0–200)
HDL: 62 mg/dL (ref >39)
Total CHOL/HDL Ratio: 2.4 Ratio
Triglycerides: 90 mg/dL (ref <150)
VLDL: 18 mg/dL (ref 0–40)

## 2013-05-26 NOTE — Telephone Encounter (Signed)
[name], I saw you put in an order for a lipid panel for Patrick Perkins today, thank you, I didn't see this request until now.

## 2013-05-26 NOTE — Telephone Encounter (Signed)
Labs. Pt walked in for labs. Put order in for cholesterol

## 2013-05-29 ENCOUNTER — Other Ambulatory Visit: Payer: Self-pay

## 2013-06-02 ENCOUNTER — Encounter: Payer: Self-pay | Admitting: Family Medicine

## 2013-06-02 ENCOUNTER — Ambulatory Visit (INDEPENDENT_AMBULATORY_CARE_PROVIDER_SITE_OTHER): Payer: Medicare PPO | Admitting: Family Medicine

## 2013-06-02 VITALS — BP 127/80 | HR 67 | Wt 177.0 lb

## 2013-06-02 DIAGNOSIS — E119 Type 2 diabetes mellitus without complications: Secondary | ICD-10-CM

## 2013-06-02 DIAGNOSIS — E785 Hyperlipidemia, unspecified: Secondary | ICD-10-CM

## 2013-06-02 DIAGNOSIS — N529 Male erectile dysfunction, unspecified: Secondary | ICD-10-CM

## 2013-06-02 MED ORDER — TADALAFIL 20 MG PO TABS: 20.0000 mg | ORAL_TABLET | Freq: Every day | ORAL | Status: DC | PRN

## 2013-06-02 NOTE — Progress Notes (Signed)
CC: Patrick Perkins is a 67 y.o. male is here for Diabetes   Subjective: HPI:  Followup hyperlipidemia: Reports he has been taking Mevacor on a daily basis without missed doses without right upper quadrant pain skin or scleral discoloration nor myalgias. He successfully tries to eat a healthy diet and stays physically active most days of the week. He denies chest pain, shortness of breath, motor or sensory disturbances, limb claudication.  Followup type 2 diabetes: He has been taking metformin on a daily basis twice a day without missed doses. No blood sugars to report from home. Denies polyuria polyphasia or polydipsia. Denies motor or sensory disturbances in the extremities nor poorly healing wounds or skin changes  Patient complains of erectile dysfunction described as difficulty initiating erection that is of moderate severity that has been present for 2-3 months now no better or worse since gradual onset. Nothing particularly makes better or worse. He still has nocturnal erections. He denies dysuria, penile discharge, testicular pain or swelling. He denies exertional chest pain, he is able to climb a flight stairs without chest pain, he can move it heavy objects in his yard without chest pain, diaphoresis.   Review Of Systems Outlined In HPI  Past Medical History  Diagnosis Date  . ED (erectile dysfunction)   . Diabetes mellitus      Family History  Problem Relation Age of Onset  . Cancer Father 101    colon     History  Substance Use Topics  . Smoking status: Former Smoker    Quit date: 09/30/2009  . Smokeless tobacco: Not on file  . Alcohol Use: Not on file     Objective: Filed Vitals:   06/02/13 0822  BP: 127/80  Pulse: 67    General: Alert and Oriented, No Acute Distress HEENT: Pupils equal, round, reactive to light. Conjunctivae clear.  Moist mucous membranes pharynx unremarkable Lungs: Clear to auscultation bilaterally, no wheezing/ronchi/rales.  Comfortable  work of breathing. Good air movement. Cardiac: Regular rate and rhythm. Normal S1/S2.  No murmurs, rubs, nor gallops.  No carotid bruits Abdomen: Soft nontender palpation Extremities: No peripheral edema.  Strong peripheral pulses.  Mental Status: No depression, anxiety, nor agitation. Skin: Warm and dry.  Assessment & Plan: Patrick Perkins was seen today for diabetes.  Diagnoses and associated orders for this visit:  DM - POCT HgB A1C - metFORMIN (GLUCOPHAGE) 850 MG tablet; Take 1 tablet (850 mg total) by mouth every evening.  Hyperlipemia  Erectile dysfunction - tadalafil (CIALIS) 20 MG tablet; Take 1 tablet (20 mg total) by mouth daily as needed for erectile dysfunction.    Type 2 diabetes: A1c of 5.8 well controlled I've encouraged him to cut down to only a single metformin on a daily basis to help minimize daily medication regimen.  Hyperlipidemia: Reviewed LDL and cholesterol profile, goal LDL less than 100 which he has met continue daily Mevacor Erectile dysfunction: I believe his heart is healthy enough to have sex we will start with samples of Cialis I've asked him to call me if he notices any improvement for a formal prescription, return within 1 month no improvement.  25 minutes spent face-to-face during visit today of which at least 50% was counseling or coordinating care regarding erectile dysfunction, hyperlipidemia, type 2 diabetes.   Return in about 3 months (around 09/02/2013) for DM.

## 2013-06-03 ENCOUNTER — Encounter: Payer: Self-pay | Admitting: Family Medicine

## 2013-09-15 ENCOUNTER — Ambulatory Visit (INDEPENDENT_AMBULATORY_CARE_PROVIDER_SITE_OTHER): Payer: Medicare PPO | Admitting: Family Medicine

## 2013-09-15 ENCOUNTER — Encounter: Payer: Self-pay | Admitting: Family Medicine

## 2013-09-15 VITALS — BP 120/75 | HR 63 | Wt 177.0 lb

## 2013-09-15 DIAGNOSIS — E119 Type 2 diabetes mellitus without complications: Secondary | ICD-10-CM

## 2013-09-15 DIAGNOSIS — N529 Male erectile dysfunction, unspecified: Secondary | ICD-10-CM

## 2013-09-15 LAB — POCT UA - MICROALBUMIN

## 2013-09-15 LAB — POCT GLYCOSYLATED HEMOGLOBIN (HGB A1C): Hemoglobin A1C: 5.9

## 2013-09-15 MED ORDER — SILDENAFIL CITRATE 50 MG PO TABS: 50.0000 mg | ORAL_TABLET | ORAL | Status: DC | PRN

## 2013-09-15 NOTE — Progress Notes (Signed)
CC: Patrick Perkins is a 67 y.o. male is here for Diabetes   Subjective: HPI:  Followup type 2 diabetes: Continues to take metformin 850 mg every evening. Fasting blood sugars consistently below 130 no postprandial sugars. Denies hypoglycemic episodes. Denies polyuria polyphasia or polydipsia  Follow up erectile dysfunction: Cialis provided mild improvement of inability to maintain erection symptoms are still interference quality of life. Present any of time of day or day of the week. Still gets nocturnal erections. Denies dysuria, nor testicular pain or swelling. Does not take nitrates. Denies chest pain or shortness of breath with exertion.   Review Of Systems Outlined In HPI  Past Medical History  Diagnosis Date  . ED (erectile dysfunction)   . Diabetes mellitus      Family History  Problem Relation Age of Onset  . Cancer Father 44    colon     History  Substance Use Topics  . Smoking status: Former Smoker    Quit date: 09/30/2009  . Smokeless tobacco: Not on file  . Alcohol Use: Not on file     Objective: Filed Vitals:   09/15/13 0840  BP: 120/75  Pulse: 63    Vital signs reviewed. General: Alert and Oriented, No Acute Distress HEENT: Pupils equal, round, reactive to light. Conjunctivae clear.  External ears unremarkable.  Moist mucous membranes. Lungs: Clear and comfortable work of breathing, speaking in full sentences without accessory muscle use. No wheezing rhonchi nor rales Cardiac: Regular rate and rhythm.  No murmurs rubs or gallops Neuro: CN II-XII grossly intact, gait normal. Extremities: No peripheral edema.  Strong peripheral pulses.  Mental Status: No depression, anxiety, nor agitation. Logical though process. Skin: Warm and dry.  Assessment & Plan: Patrick Perkins was seen today for diabetes.  Diagnoses and associated orders for this visit:  DM - Cancel: Urine Microalbumin w/creat. ratio - POCT HgB A1C - POCT UA - Microalbumin  Erectile  dysfunction - sildenafil (VIAGRA) 50 MG tablet; Take 1 tablet (50 mg total) by mouth as needed for erectile dysfunction.    Type 2 diabetes: A1c 5.9 urine microalbumin:creatinine normal currently well controlled overall. Continue metformin after the holidays we will potentially stop this medication if A1c is still well controlled. Continue daily aspirin and statin Erectile dysfunction: Chronic uncontrolled condition stop Cialis start Viagra  Flu shot received today  Return in about 3 months (around 12/16/2013).

## 2013-12-22 ENCOUNTER — Encounter: Payer: Self-pay | Admitting: Family Medicine

## 2013-12-22 ENCOUNTER — Ambulatory Visit (INDEPENDENT_AMBULATORY_CARE_PROVIDER_SITE_OTHER): Payer: Medicare PPO | Admitting: Family Medicine

## 2013-12-22 VITALS — BP 118/77 | HR 84 | Wt 173.0 lb

## 2013-12-22 DIAGNOSIS — E119 Type 2 diabetes mellitus without complications: Secondary | ICD-10-CM

## 2013-12-22 LAB — POCT GLYCOSYLATED HEMOGLOBIN (HGB A1C): Hemoglobin A1C: 5.9

## 2013-12-22 MED ORDER — GLUCOSE BLOOD VI STRP: ORAL_STRIP | Status: DC

## 2013-12-22 NOTE — Addendum Note (Signed)
Addended by: Marcial Pacas on: 12/22/2013 10:58 AM   Modules accepted: Orders, Medications

## 2013-12-22 NOTE — Progress Notes (Signed)
CC: Patrick Perkins is a 68 y.o. male is here for Diabetes   Subjective: HPI:  Followup type 2 diabetes: Fasting blood sugar ranging between 110-120. He has not been checking postprandial sugars. He denies hypoglycemic episodes since I saw him last. Denies polyuria polyphasia or polydipsia. Denies vision loss or poorly healing wounds. He takes a statin and baby aspirin on a daily basis. He is taking metformin only once a day. There's been no chest pain shortness of breath nor limb claudication   Review Of Systems Outlined In HPI  Past Medical History  Diagnosis Date  . ED (erectile dysfunction)   . Diabetes mellitus      Family History  Problem Relation Age of Onset  . Cancer Father 75    colon     History  Substance Use Topics  . Smoking status: Former Smoker    Quit date: 09/30/2009  . Smokeless tobacco: Not on file  . Alcohol Use: Not on file     Objective: Filed Vitals:   12/22/13 0841  BP: 118/77  Pulse: 84    General: Alert and Oriented, No Acute Distress HEENT: Pupils equal, round, reactive to light. Conjunctivae clear.  Moist mucous membranes pharynx unremarkable Lungs: Clear to auscultation bilaterally, no wheezing/ronchi/rales.  Comfortable work of breathing. Good air movement. Cardiac: Regular rate and rhythm. Normal S1/S2.  No murmurs, rubs, nor gallops.   Diabetic Foot Exam: Dorsalis pedis pulses 1+ bilaterally.  Monofilament sensation intact on plantar and dorsal surface bilaterally.  No signs of infection, skin breakdown, nor ulceration. Extremities: No peripheral edema.  Strong peripheral pulses.  Mental Status: No depression, anxiety, nor agitation. Skin: Warm and dry.  Assessment & Plan: Patrick Perkins was seen today for diabetes.  Diagnoses and associated orders for this visit:  DM - POCT HgB A1C  DM (diabetes mellitus) - glucose blood (ACCU-CHEK AVIVA) test strip; Use as instructed    Type 2 diabetes: A1c 5.9 today well controlled we discussed  taking a holiday off of metformin however if fasting blood sugars become above 120 encouraged to restart metformin  until I see him next  Return in about 3 months (around 03/21/2014).

## 2013-12-25 ENCOUNTER — Telehealth: Payer: Self-pay | Admitting: *Deleted

## 2013-12-25 DIAGNOSIS — E119 Type 2 diabetes mellitus without complications: Secondary | ICD-10-CM

## 2013-12-25 MED ORDER — GLUCOSE BLOOD VI STRP: ORAL_STRIP | Status: DC

## 2013-12-25 NOTE — Telephone Encounter (Signed)
Pt called and said his test strips were not coded correctly.Called [facility] and they said dx code needs to be on the rx because of the insurance. Sent in new rx to [facility] with dx code and pt notified

## 2013-12-26 ENCOUNTER — Telehealth: Payer: Self-pay | Admitting: *Deleted

## 2013-12-26 DIAGNOSIS — E119 Type 2 diabetes mellitus without complications: Secondary | ICD-10-CM

## 2013-12-26 MED ORDER — GLUCOSE BLOOD VI STRP: ORAL_STRIP | Status: DC

## 2013-12-26 NOTE — Telephone Encounter (Signed)
Pt called and says [facility] still has not received the rx for the test strips. Called the pharm and they HAVE received the rx but now they state they need to know the testing frequency.rx resent.Marland KitchenMarland KitchenAGAIN

## 2014-01-21 ENCOUNTER — Encounter: Payer: Self-pay | Admitting: Family Medicine

## 2014-02-05 ENCOUNTER — Encounter: Payer: Self-pay | Admitting: Family Medicine

## 2014-03-23 ENCOUNTER — Ambulatory Visit (INDEPENDENT_AMBULATORY_CARE_PROVIDER_SITE_OTHER): Payer: Medicare PPO | Admitting: Family Medicine

## 2014-03-23 ENCOUNTER — Encounter: Payer: Self-pay | Admitting: Family Medicine

## 2014-03-23 VITALS — BP 131/82 | HR 67 | Ht 70.0 in | Wt 168.0 lb

## 2014-03-23 DIAGNOSIS — M6740 Ganglion, unspecified site: Secondary | ICD-10-CM | POA: Insufficient documentation

## 2014-03-23 DIAGNOSIS — E785 Hyperlipidemia, unspecified: Secondary | ICD-10-CM

## 2014-03-23 DIAGNOSIS — E119 Type 2 diabetes mellitus without complications: Secondary | ICD-10-CM

## 2014-03-23 LAB — POCT GLYCOSYLATED HEMOGLOBIN (HGB A1C): HEMOGLOBIN A1C: 6.1

## 2014-03-23 MED ORDER — LOVASTATIN 20 MG PO TABS: 20.0000 mg | ORAL_TABLET | Freq: Every day | ORAL | Status: DC

## 2014-03-23 NOTE — Addendum Note (Signed)
Addended by: Doree Albee on: 03/23/2014 08:34 AM   Modules accepted: Orders

## 2014-03-23 NOTE — Progress Notes (Signed)
CC: Patrick Perkins is a 68 y.o. male is here for Follow-up   Subjective: HPI:  Followup type 2 diabetes: States the majority of his fasting blood sugars have been ranging between 1:15-120. He had 2 readings earlier this week at 130 for unknown reasons. He has cut down on carbohydrates and is exercising with light activity most days of the week. Has lost about 5 pounds intentionally since I saw him last. Denies shortness of breath, chest pain, polyuria polyphasia polydipsia nor poorly healing wounds  Followup hyperlipidemia: Requests refill on lovastatin without any recent or remote myalgias nor right upper quadrant pain.  Has a mass on his left volar wrist has been present for the past few weeks nothing particularly makes it larger or smaller. It is painless and nontender. Present all throughout the day, mild in severity. Has never had this before. Denies wrist pain.   Review Of Systems Outlined In HPI  Past Medical History  Diagnosis Date  . ED (erectile dysfunction)   . Diabetes mellitus     Past Surgical History  Procedure Laterality Date  . Hernia repair  2005    double   Family History  Problem Relation Age of Onset  . Cancer Father 92    colon    History   Social History  . Marital Status: Married    Spouse Name: N/A    Number of Children: N/A  . Years of Education: N/A   Occupational History  . Not on file.   Social History Main Topics  . Smoking status: Former Smoker    Quit date: 09/30/2009  . Smokeless tobacco: Not on file  . Alcohol Use: Not on file  . Drug Use: Not on file  . Sexual Activity: Not on file   Other Topics Concern  . Not on file   Social History Narrative  . No narrative on file     Objective: BP 131/82  Pulse 67  Ht 5\' 10"  (1.778 m)  Wt 168 lb (76.204 kg)  BMI 24.11 kg/m2  General: Alert and Oriented, No Acute Distress HEENT: Pupils equal, round, reactive to light. Conjunctivae clear.  Moist mucous membranes pharynx  unremarkable Lungs: Clear to auscultation bilaterally, no wheezing/ronchi/rales.  Comfortable work of breathing. Good air movement. Cardiac: Regular rate and rhythm. Normal S1/S2.  No murmurs, rubs, nor gallops.   Extremities: No peripheral edema.  Strong peripheral pulses.  Nontender half centimeter in diameter mobile mass on the volar surface of the left wrist overlying radial carpal joint.  Mental Status: No depression, anxiety, nor agitation. Skin: Warm and dry.  Assessment & Plan: Patrick Perkins was seen today for follow-up.  Diagnoses and associated orders for this visit:  DM  Hyperlipemia - lovastatin (MEVACOR) 20 MG tablet; Take 1 tablet (20 mg total) by mouth at bedtime.  Ganglion cyst    Hyperlipidemia: Controlled chronic condition, due for cholesterol check and liver enzymes after July. Refills of lovastatin. Ganglion cyst: Discussed benign nature and no need for intervention and is causing pain. If pain develops we'll refer to Dr. Darene Lamer. for consideration of aspiration. Type 2 diabetes: A1c 6.1, controlled, continue to remain OFF metformin.  Return in about 3 months (around 06/23/2014).

## 2014-06-19 ENCOUNTER — Telehealth: Payer: Self-pay | Admitting: *Deleted

## 2014-06-19 DIAGNOSIS — E785 Hyperlipidemia, unspecified: Secondary | ICD-10-CM

## 2014-06-19 NOTE — Telephone Encounter (Signed)
labs

## 2014-06-22 LAB — COMPLETE METABOLIC PANEL WITH GFR
ALK PHOS: 68 U/L (ref 39–117)
ALT: 22 U/L (ref 0–53)
AST: 18 U/L (ref 0–37)
Albumin: 4.4 g/dL (ref 3.5–5.2)
BILIRUBIN TOTAL: 0.5 mg/dL (ref 0.2–1.2)
BUN: 16 mg/dL (ref 6–23)
CO2: 30 mEq/L (ref 19–32)
Calcium: 9.5 mg/dL (ref 8.4–10.5)
Chloride: 103 mEq/L (ref 96–112)
Creat: 1.02 mg/dL (ref 0.50–1.35)
GFR, EST NON AFRICAN AMERICAN: 76 mL/min
GFR, Est African American: 88 mL/min
GLUCOSE: 127 mg/dL — AB (ref 70–99)
Potassium: 5.5 mEq/L — ABNORMAL HIGH (ref 3.5–5.3)
SODIUM: 140 meq/L (ref 135–145)
Total Protein: 6.6 g/dL (ref 6.0–8.3)

## 2014-06-22 LAB — LIPID PANEL
CHOL/HDL RATIO: 2.3 ratio
Cholesterol: 148 mg/dL (ref 0–200)
HDL: 65 mg/dL (ref >39)
LDL Cholesterol: 52 mg/dL (ref 0–99)
Triglycerides: 153 mg/dL — ABNORMAL HIGH (ref <150)
VLDL: 31 mg/dL (ref 0–40)

## 2014-06-23 ENCOUNTER — Telehealth: Payer: Self-pay | Admitting: Family Medicine

## 2014-06-23 DIAGNOSIS — E875 Hyperkalemia: Secondary | ICD-10-CM

## 2014-06-23 NOTE — Telephone Encounter (Signed)
Patrick Perkins, Will you please let patient know that his lovastatin continues to keep his cholesterol at goal without evidence of liver inflammation.  His potassium was just slightly up which is out of character for him, I'd recommend have this rechecked to see if it was an outlier or something that persists and needs further workup.  (Lab slips in your inbox).

## 2014-06-23 NOTE — Telephone Encounter (Signed)
Message left on vm with results and advise

## 2014-06-24 ENCOUNTER — Encounter: Payer: Self-pay | Admitting: Family Medicine

## 2014-06-24 ENCOUNTER — Ambulatory Visit (INDEPENDENT_AMBULATORY_CARE_PROVIDER_SITE_OTHER): Payer: Medicare PPO | Admitting: Family Medicine

## 2014-06-24 VITALS — BP 106/69 | HR 62 | Wt 160.0 lb

## 2014-06-24 DIAGNOSIS — E785 Hyperlipidemia, unspecified: Secondary | ICD-10-CM

## 2014-06-24 DIAGNOSIS — E119 Type 2 diabetes mellitus without complications: Secondary | ICD-10-CM

## 2014-06-24 DIAGNOSIS — E875 Hyperkalemia: Secondary | ICD-10-CM

## 2014-06-24 LAB — POTASSIUM: POTASSIUM: 5.3 meq/L (ref 3.5–5.3)

## 2014-06-24 NOTE — Progress Notes (Signed)
CC: Patrick Perkins is a 68 y.o. male is here for Diabetes   Subjective: HPI:  Followup type 2 diabetes: Since February he is not taking metformin but has cut out carbohydrates in his diet and is trying to be more physically active. Denies polyuria polyphasia or polydipsia. Fasting blood sugars are ranging between 120 and 130. No random or postprandial blood sugars to report.  Followup hyperlipidemia: Earlier this week if cholesterol checked and continues to take lovastatin on a daily basis without myalgias her right upper quadrant pain. Denies exertional chest pain, shortness of breath, nor limb claudication. LDL was at goal.  Follow hyperkalemia: On routine blood work earlier this week he had a potassium level that was just slightly above the upper limit of normal. He tells me he's never had this before and on chart review it appears that this is correct. He denies irregular heartbeat, muscle weakness, nor muscle cramping. He does endorse that he does enjoy bananas, avocados, and orange juice     Review Of Systems Outlined In HPI  Past Medical History  Diagnosis Date  . ED (erectile dysfunction)   . Diabetes mellitus     Past Surgical History  Procedure Laterality Date  . Hernia repair  2005    double   Family History  Problem Relation Age of Onset  . Cancer Father 48    colon    History   Social History  . Marital Status: Married    Spouse Name: N/A    Number of Children: N/A  . Years of Education: N/A   Occupational History  . Not on file.   Social History Main Topics  . Smoking status: Former Smoker    Quit date: 09/30/2009  . Smokeless tobacco: Not on file  . Alcohol Use: Not on file  . Drug Use: Not on file  . Sexual Activity: Not on file   Other Topics Concern  . Not on file   Social History Narrative  . No narrative on file     Objective: BP 106/69  Pulse 62  Wt 160 lb (72.576 kg)  General: Alert and Oriented, No Acute Distress HEENT: Pupils  equal, round, reactive to light. Conjunctivae clear.  Moist mucous membranes pharynx unremarkable Lungs: Clear to auscultation bilaterally, no wheezing/ronchi/rales.  Comfortable work of breathing. Good air movement. Cardiac: Regular rate and rhythm. Normal S1/S2.  No murmurs, rubs, nor gallops.   Extremities: No peripheral edema.  Strong peripheral pulses. Painless volar ganglion cyst at the left wrist approximately 1 cm in diameter Mental Status: No depression, anxiety, nor agitation. Skin: Warm and dry.  Assessment & Plan: Patrick Perkins was seen today for diabetes.  Diagnoses and associated orders for this visit:  DM  Hyperlipemia  Hyperkalemia    Type 2 diabetes: A1c 6.0 and controlled, we will continue to hold metformin. Congratulated his success with diet and exercise interventions Hyperlipidemia: Controlled, continue lovastatin Hyperkalemia: Rechecking potassium today   Return in about 3 months (around 09/24/2014).

## 2014-07-09 ENCOUNTER — Encounter: Payer: Self-pay | Admitting: Family Medicine

## 2014-07-09 DIAGNOSIS — M5412 Radiculopathy, cervical region: Secondary | ICD-10-CM | POA: Insufficient documentation

## 2014-07-21 ENCOUNTER — Encounter: Payer: Self-pay | Admitting: Family Medicine

## 2014-10-21 ENCOUNTER — Encounter: Payer: Self-pay | Admitting: Family Medicine

## 2014-10-21 ENCOUNTER — Ambulatory Visit (INDEPENDENT_AMBULATORY_CARE_PROVIDER_SITE_OTHER): Payer: Medicare PPO | Admitting: Family Medicine

## 2014-10-21 VITALS — BP 128/77 | HR 65 | Ht 70.5 in | Wt 174.0 lb

## 2014-10-21 DIAGNOSIS — M5412 Radiculopathy, cervical region: Secondary | ICD-10-CM

## 2014-10-21 DIAGNOSIS — E119 Type 2 diabetes mellitus without complications: Secondary | ICD-10-CM

## 2014-10-21 DIAGNOSIS — E785 Hyperlipidemia, unspecified: Secondary | ICD-10-CM

## 2014-10-21 LAB — POCT UA - MICROALBUMIN

## 2014-10-21 LAB — POCT GLYCOSYLATED HEMOGLOBIN (HGB A1C): Hemoglobin A1C: 6.3

## 2014-10-21 MED ORDER — LOVASTATIN 20 MG PO TABS: 20.0000 mg | ORAL_TABLET | Freq: Every day | ORAL | Status: DC

## 2014-10-21 NOTE — Addendum Note (Signed)
Addended by: Doree Albee on: 10/21/2014 01:48 PM   Modules accepted: Orders

## 2014-10-21 NOTE — Progress Notes (Signed)
CC: Patrick Perkins is a 68 y.o. male is here for Follow-up   Subjective: HPI:  Follow-up type 2 diabetes: Currently not taking any antihyperglycemic medication. He tries to watch what he eats but has had trouble sticking to a strict diet during the holiday season. Fasting blood sugars are ranging between 110-120. Denies polyuria polyphagia polydipsia nor poorly healing wounds.  Follow-up cervical radiculitis: States that he is pretty much 100% pain-free with respect to neck pain and radicular symptoms down both arms since physical therapy prescribed by his neurosurgeon. He denies any motor or sensory disturbances anywhere in the body.  Follow-up hyperlipidemia: Continues to take lovastatin on a daily basis without right upper quadrant pain or myalgias. He is requesting refills. Cholesterol was checked earlier this year and was normal along with normal liver enzymes. No chest pain or limb claudication   Review Of Systems Outlined In HPI  Past Medical History  Diagnosis Date  . ED (erectile dysfunction)   . Diabetes mellitus     Past Surgical History  Procedure Laterality Date  . Hernia repair  2005    double   Family History  Problem Relation Age of Onset  . Cancer Father 20    colon    History   Social History  . Marital Status: Married    Spouse Name: N/A    Number of Children: N/A  . Years of Education: N/A   Occupational History  . Not on file.   Social History Main Topics  . Smoking status: Former Smoker    Quit date: 09/30/2009  . Smokeless tobacco: Not on file  . Alcohol Use: Not on file  . Drug Use: Not on file  . Sexual Activity: Not on file   Other Topics Concern  . Not on file   Social History Narrative  . No narrative on file     Objective: BP 128/77 mmHg  Pulse 65  Ht 5' 10.5" (1.791 m)  Wt 174 lb (78.926 kg)  BMI 24.61 kg/m2   General: Alert and Oriented, No Acute Distress HEENT: Pupils equal, round, reactive to light. Conjunctivae  clear.  Moist mucous membranes pharynx unremarkable Lungs: Clear to auscultation bilaterally, no wheezing/ronchi/rales.  Comfortable work of breathing. Good air movement. Cardiac: Regular rate and rhythm. Normal S1/S2.  No murmurs, rubs, nor gallops.   Extremities: No peripheral edema.  Strong peripheral pulses.  Mental Status: No depression, anxiety, nor agitation. Skin: Warm and dry.  Assessment & Plan: Patrick Perkins was seen today for follow-up.  Diagnoses and associated orders for this visit:  Type 2 diabetes mellitus without complication - POCT UA - Microalbumin  Cervical radiculitis  Hyperlipemia - Discontinue: lovastatin (MEVACOR) 20 MG tablet; Take 1 tablet (20 mg total) by mouth at bedtime. - lovastatin (MEVACOR) 20 MG tablet; Take 1 tablet (20 mg total) by mouth at bedtime.    Type 2 diabetes: Controlled A1c of 6.2, continue dietary and exercise interventions to control blood sugar. Urine microalbumin to creatinine ratio normal today.   Hyperlipidemia: Controlled withmost recent lipid panel back in August continue Mevacor with recheck in 8-9 months. Cervical radiculitis: Resolved, discussed that this is likely to come back sometime in the future and that if he is having difficulty getting with his neurosurgeon he can see me for consideration of prednisone burst.  Return for 3-6 months for blood sugar follow up.

## 2015-03-05 ENCOUNTER — Ambulatory Visit (INDEPENDENT_AMBULATORY_CARE_PROVIDER_SITE_OTHER): Payer: Medicare PPO | Admitting: Family Medicine

## 2015-03-05 ENCOUNTER — Encounter: Payer: Self-pay | Admitting: Family Medicine

## 2015-03-05 VITALS — BP 129/75 | HR 70 | Wt 178.0 lb

## 2015-03-05 DIAGNOSIS — R1031 Right lower quadrant pain: Secondary | ICD-10-CM

## 2015-03-05 DIAGNOSIS — R1030 Lower abdominal pain, unspecified: Secondary | ICD-10-CM | POA: Diagnosis not present

## 2015-03-05 NOTE — Progress Notes (Signed)
CC: Patrick Perkins is a 69 y.o. male is here for Hernia   Subjective: HPI:  Right groin pain that has been present for the past 3 weeks. Present on a daily basis all hours of the day. Is described as a discomfort mild in severity. Nothing particularly makes it better or worse. He denies any trauma or overexertion. No interventions to relieve pain as of yet. He tells me it feels like prior issues with a hernia however feels a little bit lower than prior issues with hernias.  Pain radiates into the right scrotum.  He denies any constipation diarrhea decreased appetite nor blood in stool. Denies nausea. Denies urinary hesitancy, dysuria, nor any other genitourinary complaints.   Review Of Systems Outlined In HPI  Past Medical History  Diagnosis Date  . ED (erectile dysfunction)   . Diabetes mellitus     Past Surgical History  Procedure Laterality Date  . Hernia repair  2005    double   Family History  Problem Relation Age of Onset  . Cancer Father 6    colon    History   Social History  . Marital Status: Married    Spouse Name: N/A  . Number of Children: N/A  . Years of Education: N/A   Occupational History  . Not on file.   Social History Main Topics  . Smoking status: Former Smoker    Quit date: 09/30/2009  . Smokeless tobacco: Not on file  . Alcohol Use: Not on file  . Drug Use: Not on file  . Sexual Activity: Not on file   Other Topics Concern  . Not on file   Social History Narrative     Objective: BP 129/75 mmHg  Pulse 70  Wt 178 lb (80.74 kg)  General: Alert and Oriented, No Acute Distress HEENT: Pupils equal, round, reactive to light. Conjunctivae clear.  Moist mucous membranes Lungs: Clear comfortable work of breathing Cardiac: Regular rate and rhythm.  Abdomen: Soft nontender no rebound or guarding Genitourinary: Bilateral descended testes both of which are nontender. There is no tenderness elicited with palpation throughout the scrotum  bilaterally. No appreciable mass or hernia in the right scrotum or inguinal canal. Unable to reproduce pain with palpation in this region Extremities: No peripheral edema.  Strong peripheral pulses.  Mental Status: No depression, anxiety, nor agitation. Skin: Warm and dry.  Assessment & Plan: Dameon was seen today for hernia.  Diagnoses and all orders for this visit:  Right groin pain Orders: -     CT Abdomen Pelvis W Contrast; Future   Unable to find any evidence of a hernia in the right groin. Further evaluation with CT scan is warranted. Signs and symptoms requring emergent/urgent reevaluation were discussed with the patient. Ultimate plan will be based on CT scan results.  Return if symptoms worsen or fail to improve.

## 2015-03-05 NOTE — Addendum Note (Signed)
Addended by: Isaias Cowman C on: 03/05/2015 10:06 AM   Modules accepted: Orders

## 2015-03-06 LAB — BASIC METABOLIC PANEL WITH GFR
BUN: 17 mg/dL (ref 6–23)
CALCIUM: 9.5 mg/dL (ref 8.4–10.5)
CHLORIDE: 103 meq/L (ref 96–112)
CO2: 28 meq/L (ref 19–32)
CREATININE: 0.9 mg/dL (ref 0.50–1.35)
GFR, Est Non African American: 88 mL/min
Glucose, Bld: 103 mg/dL — ABNORMAL HIGH (ref 70–99)
POTASSIUM: 4.7 meq/L (ref 3.5–5.3)
Sodium: 141 mEq/L (ref 135–145)

## 2015-03-08 ENCOUNTER — Ambulatory Visit (HOSPITAL_BASED_OUTPATIENT_CLINIC_OR_DEPARTMENT_OTHER)
Admission: RE | Admit: 2015-03-08 | Discharge: 2015-03-08 | Disposition: A | Discharge status: Home / Self Care | Payer: Medicare PPO | Source: Ambulatory Visit | Attending: Family Medicine | Admitting: Family Medicine

## 2015-03-08 ENCOUNTER — Telehealth: Payer: Self-pay | Admitting: Family Medicine

## 2015-03-08 DIAGNOSIS — K5730 Diverticulosis of large intestine without perforation or abscess without bleeding: Secondary | ICD-10-CM | POA: Insufficient documentation

## 2015-03-08 DIAGNOSIS — R1031 Right lower quadrant pain: Secondary | ICD-10-CM

## 2015-03-08 DIAGNOSIS — J8410 Pulmonary fibrosis, unspecified: Secondary | ICD-10-CM | POA: Diagnosis not present

## 2015-03-08 DIAGNOSIS — K769 Liver disease, unspecified: Secondary | ICD-10-CM | POA: Insufficient documentation

## 2015-03-08 MED ORDER — IOHEXOL 300 MG/ML  SOLN
100.0000 mL | Freq: Once | INTRAMUSCULAR | Status: AC | PRN
  Administered 2015-03-08: 100 mL via INTRAVENOUS

## 2015-03-08 NOTE — Telephone Encounter (Signed)
Patient has been informed. Patrick Perkins,CMA  

## 2015-03-08 NOTE — Telephone Encounter (Signed)
Referral has been placed. 

## 2015-03-08 NOTE — Telephone Encounter (Signed)
Spoke to patient gave him information as noted below. Patient is interested in physical therapy. Rhonda Cunningham,CMA

## 2015-03-08 NOTE — Telephone Encounter (Signed)
Patrick Perkins Will you please let patient know that his ct scan showed some fat in both inguinal canals but no signs of herniated bowel.  I don't think this is going to require surgery since the bowel is not involved.  If the pain is still bothering him I'd recommend physical therapy as the next step in treatment, please let me know if he's interested in this referral.

## 2015-03-10 ENCOUNTER — Encounter: Payer: Self-pay | Admitting: Physical Therapy

## 2015-03-10 ENCOUNTER — Ambulatory Visit (INDEPENDENT_AMBULATORY_CARE_PROVIDER_SITE_OTHER): Payer: Medicare PPO | Admitting: Physical Therapy

## 2015-03-10 DIAGNOSIS — R29898 Other symptoms and signs involving the musculoskeletal system: Secondary | ICD-10-CM

## 2015-03-10 DIAGNOSIS — R1031 Right lower quadrant pain: Secondary | ICD-10-CM

## 2015-03-10 DIAGNOSIS — M542 Cervicalgia: Secondary | ICD-10-CM | POA: Diagnosis not present

## 2015-03-10 DIAGNOSIS — M5412 Radiculopathy, cervical region: Secondary | ICD-10-CM | POA: Diagnosis not present

## 2015-03-10 NOTE — Patient Instructions (Signed)
Flexors, Kneeling   [place] [id number]   Kneel on right leg. Slowly push pelvis down while slightly arching back until stretch is felt on front of hip. Hold _30__ seconds.  Repeat _2__ times per session. Do _1-2__ sessions per day.  Abdominal Bracing With Pelvic Floor (Hook-Lying)   With neutral spine, tighten pelvic floor and abdominals. Hold 10 seconds. Repeat __10_ times. Do _1__ times a day  Hip External Rotation With Pillow: Transverse Plane Stability   One knee bent, one leg straight, on pillow. Slowly roll bent knee out. Be sure pelvis does not rotate. Do _10__ times. Restabilize pelvis. Repeat with other leg. Do _1-2__ sets, _1__ times per day.  Copyright  VHI. All rights reserved.

## 2015-03-10 NOTE — Therapy (Signed)
[facility] [place], [place], [address] Phone: [phone]   Fax:  [phone]  Physical Therapy Evaluation  Patient Details  Name: Patrick Perkins MRN: [id number] Date of Birth: 09/02/1946 Referring Provider:  Marcial Pacas, DO  Encounter Date: 03/10/2015      PT End of Session - 03/10/15 1506    Visit Number 1   Number of Visits 6   Date for PT Re-Evaluation 03/31/15   PT Start Time 3536   PT Stop Time 1515   PT Time Calculation (min) 43 min   Activity Tolerance Patient tolerated treatment well      Past Medical History  Diagnosis Date  . ED (erectile dysfunction)   . Diabetes mellitus     Past Surgical History  Procedure Laterality Date  . Hernia repair  2005    double    There were no vitals filed for this visit.  Visit Diagnosis:  Pain in the groin, right - Plan: PT plan of care cert/re-cert  Weakness of right hip - Plan: PT plan of care cert/re-cert      Subjective Assessment - 03/10/15 1429    Subjective Pt reports Rt groin pain starting about a month ago.  He thought is may be an hernia however testing is (-) for this.    Diagnostic tests CT scan was (-) for hernia.    Currently in Pain? Yes   Pain Score 2    Pain Location Pelvis   Pain Orientation Right   Pain Descriptors / Indicators Dull   Pain Type Acute pain   Pain Onset More than a month ago   Pain Frequency Constant   Aggravating Factors  will get up to a 4/10 at times during the day    Effect of Pain on Daily Activities lying on back and resting.             [facility] PT Assessment - 03/10/15 0001    Assessment   Medical Diagnosis Rt groin pain   Onset Date 02/10/15   Next MD Visit june   Precautions   Precautions None   Balance Screen   Has the patient fallen in the past 6 months No   Has the patient had a decrease in activity level because of a fear of falling?  No   Is the patient reluctant to leave their  home because of a fear of falling?  No   Home Environment   Living Enviornment Private residence   Living Arrangements Spouse/significant other   Prior Function   Level of Independence --  i with all activities   Leisure golf, yard work   Observation/Other Assessments   Focus on Therapeutic Outcomes (FOTO)  29% limited   Posture/Postural Control   Posture/Postural Control No significant limitations   ROM / Strength   AROM / PROM / Strength AROM;Strength   AROM   Overall AROM  Within functional limits for tasks performed  lumbar spine  and bilat LE's    Strength   Strength Assessment Site Hip  Lt WNL   Right/Left Hip Right   Right Hip Flexion 5/5   Right Hip Extension 5/5   Right Hip External Rotation  --  5-/5   Right Hip Internal Rotation  --  5-/5   Right Hip ABduction --  5-/5   Palpation   Palpation very tender and tight in Rt hip flexors.    Special Tests    Special Tests --  (-)  lumbar and SI special tests.                    [facility] Adult PT Treatment/Exercise - 03/10/15 0001    Exercises   Exercises Lumbar   Lumbar Exercises: Stretches   Hip Flexor Stretch 2 reps;30 seconds  in high kneel   Lumbar Exercises: Supine   Ab Set 10 reps  10 sec holds, then with single knee in/out x10 each side   Modalities   Modalities Moist Heat   Moist Heat Therapy   Number Minutes Moist Heat 15 Minutes   Moist Heat Location --  Rt groin and hip flexors                PT Education - 03/10/15 1458    Education provided Yes   Education Details HEP   Person(s) Educated Patient   Methods Explanation;Handout   Comprehension Returned demonstration             PT Long Term Goals - 03/10/15 1532    PT LONG TERM GOAL #1   Title I with advanced HEP   Time 3   Period Weeks   Status New   PT LONG TERM GOAL #2   Title reports =/> 75% pain reduction in his Rt groin   Time 3   Period Weeks   Status New   PT LONG TERM GOAL #3   Title tolerate STW  to rt hip flexors without increased pain   Time 3   Period Weeks   Status New   PT LONG TERM GOAL #4   Title improve FOTO =/< 22% limited (CJ level )   Time 3   Period Weeks   Status New               Plan - 03/10/15 1529    Clinical Impression Statement Pt presents with point tenderness and tightness in Rt hip flexors, responded well to STW and ther ex to work this area. May have developed some adhesions in this area from his hernia repairs.    Pt will benefit from skilled therapeutic intervention in order to improve on the following deficits Pain;Decreased strength;Decreased scar mobility   Rehab Potential Good   PT Frequency 2x / week   PT Duration 3 weeks   PT Treatment/Interventions Moist Heat;Patient/family education;Therapeutic exercise;Ultrasound;Manual techniques;Electrical Stimulation;Cryotherapy   PT Next Visit Plan TPR to  Rt hip flexors and core strengthening    Consulted and Agree with Plan of Care Patient         Problem List Patient Active Problem List   Diagnosis Date Noted  . Cervical radiculitis 07/09/2014  . Ganglion cyst 03/23/2014  . Encounter for monitoring statin therapy 02/24/2013  . Benign essential tremor 08/31/2011  . Hyperlipemia 08/31/2011  . Type 2 diabetes mellitus 01/07/2010  . COLONIC POLYPS 01/01/2009  . DIVERTICULOSIS OF COLON 01/01/2009  . SMOKER 12/14/2008    Jeral Pinch PT 03/10/2015, 3:39 PM  [facility] [place], [place], [address] Phone: [phone]   Fax:  [phone]

## 2015-03-12 ENCOUNTER — Ambulatory Visit (INDEPENDENT_AMBULATORY_CARE_PROVIDER_SITE_OTHER): Payer: Medicare PPO | Admitting: Physical Therapy

## 2015-03-12 DIAGNOSIS — R1031 Right lower quadrant pain: Secondary | ICD-10-CM | POA: Diagnosis not present

## 2015-03-12 DIAGNOSIS — R29898 Other symptoms and signs involving the musculoskeletal system: Secondary | ICD-10-CM | POA: Diagnosis not present

## 2015-03-12 NOTE — Therapy (Signed)
[place], [place], [address] Phone: [phone]   Fax:  [phone]  Physical Therapy Treatment  Patient Details  Name: Patrick Perkins MRN: [id number] Date of Birth: 1946/01/25 Referring Provider:  Marcial Pacas, DO  Encounter Date: 03/12/2015      PT End of Session - 03/12/15 1103    Visit Number 2   Number of Visits 6   Date for PT Re-Evaluation 03/31/15   PT Start Time 1104   PT Stop Time 1149   PT Time Calculation (min) 45 min   Activity Tolerance Patient tolerated treatment well      Past Medical History  Diagnosis Date  . ED (erectile dysfunction)   . Diabetes mellitus     Past Surgical History  Procedure Laterality Date  . Hernia repair  2005    double    There were no vitals filed for this visit.  Visit Diagnosis:  Pain in the groin, right  Weakness of right hip      Subjective Assessment - 03/12/15 1108    Subjective Pt reported some relief after last treatment, but pain returned this morning. Pt reports he has performed HEP.    Currently in Pain? Yes   Pain Score 3    Aggravating Factors  Unknown.    Pain Relieving Factors Rest in supine            [facility] PT Assessment - 03/12/15 0001    Assessment   Medical Diagnosis Rt groin pain   Onset Date 02/10/15   Next MD Visit june                     [facility] Adult PT Treatment/Exercise - 03/12/15 0001    Lumbar Exercises: Stretches   Hip Flexor Stretch 2 reps;30 seconds  in high kneel   Lumbar Exercises: Aerobic   Stationary Bike L2 x 5 min   Lumbar Exercises: Supine   Straight Leg Raise 10 reps  RLE x 10, with ER x 20. Repeated on LLE.   Other Supine Lumbar Exercises Resisted Hip flexion with 5 sec hold x 10 reps.    Other Supine Lumbar Exercises Trans Abdominal with knee in/out x 10 each side, Trans abdominal with marching x 10 (pt began cramping in Rt hip flexor) VC for abdominal bracing.   Modalities   Modalities Moist Heat   Moist Heat Therapy   Number Minutes Moist Heat 10 Minutes   Moist Heat Location --  Rt hip flexor   Manual Therapy   Manual Therapy Other (comment)   Other Manual Therapy TPR to Rt iliopsoas with contract relax                 PT Education - 03/12/15 1141    Education provided Yes   Education Details HEP - added SLR with ER   Person(s) Educated Patient   Methods Explanation;Handout   Comprehension Verbalized understanding;Returned demonstration             PT Long Term Goals - 03/10/15 1532    PT LONG TERM GOAL #1   Title I with advanced HEP   Time 3   Period Weeks   Status New   PT LONG TERM GOAL #2   Title reports =/> 75% pain reduction in his Rt groin   Time 3   Period Weeks   Status New   PT LONG TERM GOAL #3   Title tolerate STW to rt  hip flexors without increased pain   Time 3   Period Weeks   Status New   PT LONG TERM GOAL #4   Title improve FOTO =/< 22% limited (CJ level )   Time 3   Period Weeks   Status New        Problem List Patient Active Problem List   Diagnosis Date Noted  . Cervical radiculitis 07/09/2014  . Ganglion cyst 03/23/2014  . Encounter for monitoring statin therapy 02/24/2013  . Benign essential tremor 08/31/2011  . Hyperlipemia 08/31/2011  . Type 2 diabetes mellitus 01/07/2010  . COLONIC POLYPS 01/01/2009  . DIVERTICULOSIS OF COLON 01/01/2009  . SMOKER 12/14/2008   Kerin Perna, PTA 03/12/2015 11:53 AM  [facility] [place], [place], [address] Phone: [phone]   Fax:  [phone]

## 2015-03-12 NOTE — Patient Instructions (Signed)
Straight Leg Raise: With External Leg Rotation   Lie on back with right leg straight, opposite leg bent. Rotate straight leg out and lift __6__ inches. Repeat _10___ times per set. Rest between sets. Do __3__ sets per session. Do _3__ sessions per week.  [email or web address].[email or web address]   Copyright  VHI. All rights reserved.   [facility] [place], [place] [address]  [phone] (office) [phone] (fax)

## 2015-03-15 ENCOUNTER — Ambulatory Visit (INDEPENDENT_AMBULATORY_CARE_PROVIDER_SITE_OTHER): Payer: Medicare PPO | Admitting: Physical Therapy

## 2015-03-15 DIAGNOSIS — R1031 Right lower quadrant pain: Secondary | ICD-10-CM

## 2015-03-15 DIAGNOSIS — R29898 Other symptoms and signs involving the musculoskeletal system: Secondary | ICD-10-CM

## 2015-03-15 NOTE — Therapy (Signed)
[facility] [place], [place], [address] Phone: [phone]   Fax:  [phone]  Physical Therapy Treatment  Patient Details  Name: Patrick Perkins MRN: [id number] Date of Birth: 1945-12-30 Referring Provider:  Marcial Pacas, DO  Encounter Date: 03/15/2015      PT End of Session - 03/15/15 1141    Visit Number 3   Number of Visits 6   Date for PT Re-Evaluation 03/31/15   PT Start Time 1104   PT Stop Time 1150   PT Time Calculation (min) 46 min   Activity Tolerance Patient tolerated treatment well      Past Medical History  Diagnosis Date  . ED (erectile dysfunction)   . Diabetes mellitus     Past Surgical History  Procedure Laterality Date  . Hernia repair  2005    double    There were no vitals filed for this visit.  Visit Diagnosis:  Weakness of right hip  Pain in the groin, right      Subjective Assessment - 03/15/15 1107    Subjective Pt reported he has had no pain since last visit.  Just stiffness in the mornings; works itself out.    Currently in Pain? No/denies            [facility] PT Assessment - 03/15/15 0001    Assessment   Medical Diagnosis Rt groin pain   Onset Date 02/10/15   Next MD Visit june                     [facility] Adult PT Treatment/Exercise - 03/15/15 0001    Exercises   Exercises Lumbar;Knee/Hip   Lumbar Exercises: Stretches   Hip Flexor Stretch 2 reps;30 seconds  standing lunge    Lumbar Exercises: Aerobic   Stationary Bike L2 x 5 min   Lumbar Exercises: Supine   Other Supine Lumbar Exercises Resisted Hip flexion with 5 sec hold x 10 reps.    Other Supine Lumbar Exercises Trans Abdominal with knee in/out x 10 each side, Trans abdominal with marching x 10 (improved form, required less VC), trans abd with heel slides x 10 each leg.    Knee/Hip Exercises: Supine   Straight Leg Raise with External Rotation Right;Left;10 reps;2 sets   Moist Heat  Therapy   Number Minutes Moist Heat 10 Minutes   Moist Heat Location --  Rt hip flexor, to decrease tenderness from manual    Manual Therapy   Manual Therapy Other (comment)   Other Manual Therapy TPR to Rt iliopsoas with contract relax                      PT Long Term Goals - 03/15/15 1115    PT LONG TERM GOAL #1   Title I with advanced HEP   Time 3   Period Weeks   Status On-going   PT LONG TERM GOAL #2   Title reports =/> 75% pain reduction in his Rt groin   Time 3   Period Weeks   Status Partially Met  50% reduction reported 03/15/15   PT LONG TERM GOAL #3   Title tolerate STW to rt hip flexors without increased pain   Time 3   Period Weeks   Status On-going   PT LONG TERM GOAL #4   Title improve FOTO =/< 22% limited (CJ level )   Time 3   Period Weeks   Status On-going  Plan - 03/15/15 1143    Clinical Impression Statement Pt able to tolerate increased reps of exercise without cramping in Rt hip flexor.  Pt's overall pain reduction in Rt hip is 50%, partially meeting LTG #2.  Pt making good progress towards all goals.    Pt will benefit from skilled therapeutic intervention in order to improve on the following deficits Pain;Decreased strength;Decreased scar mobility   Rehab Potential Good   PT Frequency 2x / week   PT Duration 3 weeks   PT Treatment/Interventions Moist Heat;Patient/family education;Therapeutic exercise;Ultrasound;Manual techniques;Electrical Stimulation;Cryotherapy   PT Next Visit Plan Advance HEP (core/Rt hip flexor); assess continued therapy vs D/C to HEP.   Consulted and Agree with Plan of Care Patient        Problem List Patient Active Problem List   Diagnosis Date Noted  . Cervical radiculitis 07/09/2014  . Ganglion cyst 03/23/2014  . Encounter for monitoring statin therapy 02/24/2013  . Benign essential tremor 08/31/2011  . Hyperlipemia 08/31/2011  . Type 2 diabetes mellitus 01/07/2010  . COLONIC  POLYPS 01/01/2009  . DIVERTICULOSIS OF COLON 01/01/2009  . SMOKER 12/14/2008   Kerin Perna, PTA 03/15/2015 11:56 AM  [facility] [place], [place], [address] Phone: [phone]   Fax:  [phone]

## 2015-03-22 ENCOUNTER — Ambulatory Visit (INDEPENDENT_AMBULATORY_CARE_PROVIDER_SITE_OTHER): Payer: TRICARE For Life (TFL) | Admitting: Physical Therapy

## 2015-03-22 DIAGNOSIS — R29898 Other symptoms and signs involving the musculoskeletal system: Secondary | ICD-10-CM

## 2015-03-22 NOTE — Patient Instructions (Signed)
Bridging   Squeeze ball or towel between legs, lift buttocks off floor. Hold 5 seconds, lower hips down.  Repeat 10 times.  Perform 2 sets, 1 time per day.   Strengthening: Straight Leg Raise (Phase 3)   Resting on hands, tighten muscles on front of left thigh, then lift leg _3___ inches from surface, keeping knee locked. Repeat ___10_ times per set. Do __2__ sets per session. Do _1___ sessions per day. Quads / HF, Prone   Lie face down, knees together. Grasp one ankle with same-side hand. Use towel if needed to reach. Gently pull foot toward buttock. Hold _30__ seconds. Repeat _2__ times per session. Do _1__ sessions per day.  Copyright  VHI. All rights reserved.  Butterfly, Supine   Lie on back, feet together. Lower knees toward floor. Hold _30__ seconds. Repeat _2__ times per session. Do _1__ sessions per day.  Copyright  VHI. All rights reserved.   [facility] at [facility] [place], [place] [address]  [phone] (office) [phone] (fax)

## 2015-03-22 NOTE — Therapy (Signed)
Patrick Perkins, [place], [address] Phone: [phone]   Fax:  [phone]  Physical Therapy Treatment  Patient Details  Name: Patrick Perkins MRN: [id number] Date of Birth: [birth year] Referring Provider:  Marcial Pacas, DO  Encounter Date: 03/22/2015      PT End of Session - 03/22/15 1028    Visit Number 4   Number of Visits 6   Date for PT Re-Evaluation 03/31/15   PT Start Time 1020   PT Stop Time 3474   PT Time Calculation (min) 38 min   Activity Tolerance Patient tolerated treatment well;No increased pain      Past Medical History  Diagnosis Date  . ED (erectile dysfunction)   . Diabetes mellitus     Past Surgical History  Procedure Laterality Date  . Hernia repair  2005    double    There were no vitals filed for this visit.  Visit Diagnosis:  Weakness of right hip      Subjective Assessment - 03/22/15 1029    Subjective Pt reported had some groin pain after golfing last week. Resolved with stretches and time.    Currently in Pain? No/denies            [facility] PT Assessment - 03/22/15 0001    Assessment   Medical Diagnosis Rt groin pain   Onset Date 02/10/15   Observation/Other Assessments   Focus on Therapeutic Outcomes (FOTO)  2% limited                      [facility] Adult PT Treatment/Exercise - 03/22/15 0001    Exercises   Exercises Lumbar;Knee/Hip   Lumbar Exercises: Stretches   Hip Flexor Stretch 2 reps;30 seconds  standing lunge    Lumbar Exercises: Aerobic   Stationary Bike L3: 5 min    Lumbar Exercises: Supine   Bridge 10 reps  with ball squeeze   Straight Leg Raise 20 reps  in long sitting, each leg    Other Supine Lumbar Exercises butterfly stretch for adductors x 30 sec x 2 reps    Other Supine Lumbar Exercises Trans Abdominal with knee in/out x 10 each side, Trans abdominal with marching x 10 (improved form, trans abd with heel slides x 10  each leg.    Knee/Hip Exercises: Public affairs consultant 30 seconds;2 reps  each leg    Knee/Hip Exercises: Aerobic   Stationary Bike --   Knee/Hip Exercises: Sidelying   Hip ADduction Right;Left;1 set;15 reps                PT Education - 03/22/15 1120    Education provided Yes   Education Details HEP    Person(s) Educated Patient   Methods Explanation;Handout   Comprehension Verbalized understanding;Returned demonstration             PT Long Term Goals - 03/22/15 1103    PT LONG TERM GOAL #1   Title I with advanced HEP   Time 3   Period Weeks   Status Achieved   PT LONG TERM GOAL #2   Title reports =/> 75% pain reduction in his Rt groin   Time 3   Period Weeks   Status Achieved   PT LONG TERM GOAL #3   Title tolerate STW to rt hip flexors without increased pain   Time 3   Period Weeks   Status Achieved  No pain with palpation this visit. No  STW needed.    PT LONG TERM GOAL #4   Title improve FOTO =/< 22% limited (CJ level )   Time 3   Period Weeks   Status Achieved               Plan - 03/22/15 1104    Clinical Impression Statement Pt tolerated all new exercises without increase in pain. Pt without tenderness with palpation of Rt psoas; no manual therapy needed today. Pt has met all goals and reports is ready for D/C to HEP.    Pt will benefit from skilled therapeutic intervention in order to improve on the following deficits Pain;Decreased strength;Decreased scar mobility   Rehab Potential Good   PT Treatment/Interventions Moist Heat;Patient/family education;Therapeutic exercise;Ultrasound;Manual techniques;Electrical Stimulation;Cryotherapy   PT Next Visit Plan Spoke to supervising PT regarding progress and his desire to d/c to HEP.    Consulted and Agree with Plan of Care Patient        Problem List Patient Active Problem List   Diagnosis Date Noted  . Cervical radiculitis 07/09/2014  . Ganglion cyst 03/23/2014  . Encounter for  monitoring statin therapy 02/24/2013  . Benign essential tremor 08/31/2011  . Hyperlipemia 08/31/2011  . Type 2 diabetes mellitus 01/07/2010  . COLONIC POLYPS 01/01/2009  . DIVERTICULOSIS OF COLON 01/01/2009  . SMOKER 12/14/2008   Kerin Perna, PTA 03/22/2015 11:21 AM  [place], [place], [address] Phone: [phone]   Fax:  [phone]     PHYSICAL THERAPY DISCHARGE SUMMARY  Visits from Start of Care: 4  Current functional level related to goals / functional outcomes: See above   Remaining deficits: See above   Education / Equipment: HEP  Plan: Patient agrees to discharge.  Patient goals were met. Patient is being discharged due to meeting the stated rehab goals.  ?????       Madelyn Flavors, PT 03/22/2015 2:12 PM  [facility] at [place],  [address]  [phone] (office) [phone] (fax)

## 2015-04-09 DIAGNOSIS — M5412 Radiculopathy, cervical region: Secondary | ICD-10-CM | POA: Diagnosis not present

## 2015-04-09 DIAGNOSIS — M542 Cervicalgia: Secondary | ICD-10-CM | POA: Diagnosis not present

## 2015-04-23 ENCOUNTER — Ambulatory Visit (INDEPENDENT_AMBULATORY_CARE_PROVIDER_SITE_OTHER): Payer: Medicare PPO | Admitting: Family Medicine

## 2015-04-23 ENCOUNTER — Other Ambulatory Visit: Payer: Self-pay | Admitting: Family Medicine

## 2015-04-23 ENCOUNTER — Encounter: Payer: Self-pay | Admitting: Family Medicine

## 2015-04-23 VITALS — BP 124/75 | HR 64 | Ht 70.0 in | Wt 176.0 lb

## 2015-04-23 DIAGNOSIS — Z2839 Other underimmunization status: Secondary | ICD-10-CM

## 2015-04-23 DIAGNOSIS — Z23 Encounter for immunization: Secondary | ICD-10-CM | POA: Diagnosis not present

## 2015-04-23 DIAGNOSIS — Z283 Underimmunization status: Secondary | ICD-10-CM | POA: Diagnosis not present

## 2015-04-23 DIAGNOSIS — E119 Type 2 diabetes mellitus without complications: Secondary | ICD-10-CM | POA: Diagnosis not present

## 2015-04-23 LAB — POCT GLYCOSYLATED HEMOGLOBIN (HGB A1C): HEMOGLOBIN A1C: 6.2

## 2015-04-23 MED ORDER — GLUCOSE BLOOD VI STRP: ORAL_STRIP | Status: DC

## 2015-04-23 NOTE — Addendum Note (Signed)
Addended by: Terance Hart on: 04/23/2015 09:19 AM   Modules accepted: Orders

## 2015-04-23 NOTE — Telephone Encounter (Signed)
Pharmacy did not receive last Rx, reprinted.

## 2015-04-23 NOTE — Progress Notes (Signed)
CC: Patrick Perkins is a 69 y.o. male is here for Follow-up   Subjective: HPI:  Follow-up type 2 diabetes: Checking blood sugars while fasting occasionally during the week ranging between 100-115. No polyuria polyphagia polydipsia nor poorly healing wounds. Tries to stay active with walking, tries to minimize carbohydrate intake. Denies chest pain shortness of breath orthopnea nor peripheral edema  Right groin pain is now gone after physical therapy.   Review Of Systems Outlined In HPI  Past Medical History  Diagnosis Date  . ED (erectile dysfunction)   . Diabetes mellitus     Past Surgical History  Procedure Laterality Date  . Hernia repair  2005    double   Family History  Problem Relation Age of Onset  . Cancer Father 42    colon    History   Social History  . Marital Status: Married    Spouse Name: N/A  . Number of Children: N/A  . Years of Education: N/A   Occupational History  . Not on file.   Social History Main Topics  . Smoking status: Former Smoker    Quit date: 09/30/2009  . Smokeless tobacco: Not on file  . Alcohol Use: Not on file  . Drug Use: Not on file  . Sexual Activity: Not on file   Other Topics Concern  . Not on file   Social History Narrative     Objective: BP 124/75 mmHg  Pulse 64  Ht 5\' 10"  (1.778 m)  Wt 176 lb (79.833 kg)  BMI 25.25 kg/m2  General: Alert and Oriented, No Acute Distress HEENT: Pupils equal, round, reactive to light. Conjunctivae clear.  Moist mucous membranes moist Lungs: Clear to auscultation bilaterally, no wheezing/ronchi/rales.  Comfortable work of breathing. Good air movement. Cardiac: Regular rate and rhythm. Normal S1/S2.  No murmurs, rubs, nor gallops.  No carotid bruit Extremities: No peripheral edema.  Strong peripheral pulses.  Mental Status: No depression, anxiety, nor agitation. Skin: Warm and dry.  Assessment & Plan: Jak was seen today for follow-up.  Diagnoses and all orders for this  visit:  Diabetes mellitus without complication Orders: -     POCT HgB A1C  Type 2 diabetes mellitus without complication Orders: -     glucose blood (ACCU-CHEK AVIVA) test strip; Test blood sugar twice daily    type 2 diabetes: A1c 6.2, controlled, continue current diet and exercise interventions.   Will receive Prevnar today.  Return for 3-6 months for Medicare Wellness Exam (Fasting).

## 2015-05-10 DIAGNOSIS — M5412 Radiculopathy, cervical region: Secondary | ICD-10-CM | POA: Diagnosis not present

## 2015-05-10 DIAGNOSIS — M542 Cervicalgia: Secondary | ICD-10-CM | POA: Diagnosis not present

## 2015-06-09 DIAGNOSIS — M5412 Radiculopathy, cervical region: Secondary | ICD-10-CM | POA: Diagnosis not present

## 2015-06-17 DIAGNOSIS — M542 Cervicalgia: Secondary | ICD-10-CM | POA: Diagnosis not present

## 2015-06-17 DIAGNOSIS — M5412 Radiculopathy, cervical region: Secondary | ICD-10-CM | POA: Diagnosis not present

## 2015-06-17 DIAGNOSIS — G250 Essential tremor: Secondary | ICD-10-CM | POA: Diagnosis not present

## 2015-07-10 DIAGNOSIS — M5412 Radiculopathy, cervical region: Secondary | ICD-10-CM | POA: Diagnosis not present

## 2015-07-10 DIAGNOSIS — M542 Cervicalgia: Secondary | ICD-10-CM | POA: Diagnosis not present

## 2015-08-10 DIAGNOSIS — M542 Cervicalgia: Secondary | ICD-10-CM | POA: Diagnosis not present

## 2015-08-10 DIAGNOSIS — M5412 Radiculopathy, cervical region: Secondary | ICD-10-CM | POA: Diagnosis not present

## 2015-08-23 DIAGNOSIS — H5213 Myopia, bilateral: Secondary | ICD-10-CM | POA: Diagnosis not present

## 2015-08-23 DIAGNOSIS — H40013 Open angle with borderline findings, low risk, bilateral: Secondary | ICD-10-CM | POA: Diagnosis not present

## 2015-08-23 DIAGNOSIS — H2513 Age-related nuclear cataract, bilateral: Secondary | ICD-10-CM | POA: Diagnosis not present

## 2015-08-23 DIAGNOSIS — E113291 Type 2 diabetes mellitus with mild nonproliferative diabetic retinopathy without macular edema, right eye: Secondary | ICD-10-CM | POA: Diagnosis not present

## 2015-08-23 DIAGNOSIS — H43813 Vitreous degeneration, bilateral: Secondary | ICD-10-CM | POA: Diagnosis not present

## 2015-09-09 DIAGNOSIS — M542 Cervicalgia: Secondary | ICD-10-CM | POA: Diagnosis not present

## 2015-09-09 DIAGNOSIS — M5412 Radiculopathy, cervical region: Secondary | ICD-10-CM | POA: Diagnosis not present

## 2015-09-20 ENCOUNTER — Telehealth: Payer: Self-pay

## 2015-09-20 DIAGNOSIS — Z0000 Encounter for general adult medical examination without abnormal findings: Secondary | ICD-10-CM

## 2015-09-20 DIAGNOSIS — E119 Type 2 diabetes mellitus without complications: Secondary | ICD-10-CM | POA: Diagnosis not present

## 2015-09-20 DIAGNOSIS — E785 Hyperlipidemia, unspecified: Secondary | ICD-10-CM | POA: Diagnosis not present

## 2015-09-20 NOTE — Telephone Encounter (Signed)
Patrick Perkins, Labs in you in box.  Should be submitted while fasting.

## 2015-09-20 NOTE — Telephone Encounter (Signed)
Pt advised.

## 2015-09-20 NOTE — Telephone Encounter (Signed)
Pt would like for labs to be done on Wed before his appointment on Friday.

## 2015-09-22 LAB — COMPLETE METABOLIC PANEL WITH GFR
ALT: 15 U/L (ref 9–46)
AST: 16 U/L (ref 10–35)
Albumin: 4.3 g/dL (ref 3.6–5.1)
Alkaline Phosphatase: 75 U/L (ref 40–115)
BUN: 12 mg/dL (ref 7–25)
CHLORIDE: 102 mmol/L (ref 98–110)
CO2: 28 mmol/L (ref 20–31)
CREATININE: 0.96 mg/dL (ref 0.70–1.25)
Calcium: 9.2 mg/dL (ref 8.6–10.3)
GFR, Est African American: >89 mL/min (ref >=60)
GFR, Est Non African American: 81 mL/min (ref >=60)
GLUCOSE: 118 mg/dL — AB (ref 65–99)
POTASSIUM: 4.8 mmol/L (ref 3.5–5.3)
SODIUM: 138 mmol/L (ref 135–146)
Total Bilirubin: 0.6 mg/dL (ref 0.2–1.2)
Total Protein: 6.7 g/dL (ref 6.1–8.1)

## 2015-09-22 LAB — LIPID PANEL
CHOL/HDL RATIO: 2.5 ratio (ref <=5.0)
Cholesterol: 162 mg/dL (ref 125–200)
HDL: 65 mg/dL (ref >=40)
LDL Cholesterol: 67 mg/dL (ref <130)
Triglycerides: 151 mg/dL — ABNORMAL HIGH (ref <150)
VLDL: 30 mg/dL (ref <30)

## 2015-09-22 LAB — CBC
HEMATOCRIT: 41.6 % (ref 39.0–52.0)
HEMOGLOBIN: 14.6 g/dL (ref 13.0–17.0)
MCH: 33.1 pg (ref 26.0–34.0)
MCHC: 35.1 g/dL (ref 30.0–36.0)
MCV: 94.3 fL (ref 78.0–100.0)
MPV: 10.8 fL (ref 8.6–12.4)
Platelets: 323 10*3/uL (ref 150–400)
RBC: 4.41 MIL/uL (ref 4.22–5.81)
RDW: 14 % (ref 11.5–15.5)
WBC: 6.6 10*3/uL (ref 4.0–10.5)

## 2015-09-22 LAB — HEMOGLOBIN A1C
Hgb A1c MFr Bld: 6.2 % — ABNORMAL HIGH (ref <5.7)
Mean Plasma Glucose: 131 mg/dL — ABNORMAL HIGH (ref <117)

## 2015-09-24 ENCOUNTER — Ambulatory Visit (INDEPENDENT_AMBULATORY_CARE_PROVIDER_SITE_OTHER): Payer: Medicare PPO | Admitting: Family Medicine

## 2015-09-24 ENCOUNTER — Encounter: Payer: Self-pay | Admitting: Family Medicine

## 2015-09-24 VITALS — BP 134/80 | HR 66 | Wt 178.0 lb

## 2015-09-24 DIAGNOSIS — Z0000 Encounter for general adult medical examination without abnormal findings: Secondary | ICD-10-CM | POA: Diagnosis not present

## 2015-09-24 DIAGNOSIS — E785 Hyperlipidemia, unspecified: Secondary | ICD-10-CM | POA: Diagnosis not present

## 2015-09-24 DIAGNOSIS — D126 Benign neoplasm of colon, unspecified: Secondary | ICD-10-CM

## 2015-09-24 MED ORDER — LOVASTATIN 20 MG PO TABS: 20.0000 mg | ORAL_TABLET | Freq: Every day | ORAL | Status: DC

## 2015-09-24 NOTE — Patient Instructions (Signed)
Dr. Breydon Senters's General Advice Following Your Complete Physical Exam  The Benefits of Regular Exercise: Unless you suffer from an uncontrolled cardiovascular condition, studies strongly suggest that regular exercise and physical activity will add to both the quality and length of your life.  The World Health Organization recommends 150 minutes of moderate intensity aerobic activity every week.  This is best split over 3-4 days a week, and can be as simple as a brisk walk for just over 35 minutes "most days of the week".  This type of exercise has been shown to lower LDL-Cholesterol, lower average blood sugars, lower blood pressure, lower cardiovascular disease risk, improve memory, and increase one's overall sense of wellbeing.  The addition of anaerobic (or "strength training") exercises offers additional benefits including but not limited to increased metabolism, prevention of osteoporosis, and improved overall cholesterol levels.  How Can I Strive For A Low-Fat Diet?: Current guidelines recommend that 25-35 percent of your daily energy (food) intake should come from fats.  One might ask how can this be achieved without having to dissect each meal on a daily basis?  Switch to skim or 1% milk instead of whole milk.  Focus on lean meats such as ground turkey, fresh fish, baked chicken, and lean cuts of beef as your source of dietary protein.  Limit saturated fat consumption to less than 10% of your daily caloric intake.  Limit trans fatty acid consumption primarily by limiting synthetic trans fats such as partially hydrogenated oils (Ex: fried fast foods).  Substitute olive or vegetable oil for solid fats where possible.  Moderation of Salt Intake: Provided you don't carry a diagnosis of congestive heart failure nor renal failure, I recommend a daily allowance of no more than 2300 mg of salt (sodium).  Keeping under this daily goal is associated with a decreased risk of cardiovascular events, creeping  above it can lead to elevated blood pressures and increases your risk of cardiovascular events.  Milligrams (mg) of salt is listed on all nutrition labels, and your daily intake can add up faster than you think.  Most canned and frozen dinners can pack in over half your daily salt allowance in one meal.    Lifestyle Health Risks: Certain lifestyle choices carry specific health risks.  As you may already know, tobacco use has been associated with increasing one's risk of cardiovascular disease, pulmonary disease, numerous cancers, among many other issues.  What you may not know is that there are medications and nicotine replacement strategies that can more than double your chances of successfully quitting.  I would be thrilled to help manage your quitting strategy if you currently use tobacco products.  When it comes to alcohol use, I've yet to find an "ideal" daily allowance.  Provided an individual does not have a medical condition that is exacerbated by alcohol consumption, general guidelines determine "safe drinking" as no more than two standard drinks for a man or no more than one standard drink for a male per day.  However, much debate still exists on whether any amount of alcohol consumption is technically "safe".  My general advice, keep alcohol consumption to a minimum for general health promotion.  If you or others believe that alcohol, tobacco, or recreational drug use is interfering with your life, I would be happy to provide confidential counseling regarding treatment options.  General "Over The Counter" Nutrition Advice: Postmenopausal women should aim for a daily calcium intake of 1200 mg, however a significant portion of this might already be   provided by diets including milk, yogurt, cheese, and other dairy products.  Vitamin D has been shown to help preserve bone density, prevent fatigue, and has even been shown to help reduce falls in the elderly.  Ensuring a daily intake of 800 Units of  Vitamin D is a good place to start to enjoy the above benefits, we can easily check your Vitamin D level to see if you'd potentially benefit from supplementation beyond 800 Units a day.  Folic Acid intake should be of particular concern to women of childbearing age.  Daily consumption of 400-800 mcg of Folic Acid is recommended to minimize the chance of spinal cord defects in a fetus should pregnancy occur.    For many adults, accidents still remain one of the most common culprits when it comes to cause of death.  Some of the simplest but most effective preventitive habits you can adopt include regular seatbelt use, proper helmet use, securing firearms, and regularly testing your smoke and carbon monoxide detectors.  Patrick Perkins B. Patrick Vasek DO [facility] [address], [address] [place], [place] [address] Phone: [phone]  

## 2015-09-24 NOTE — Progress Notes (Signed)
Subjective:    Patrick Perkins is a 69 y.o. male who presents for Medicare Annual/Subsequent preventive examination.   Preventive Screening-Counseling & Management  Tobacco History  Smoking status  . Former Smoker  . Quit date: 09/30/2009  Smokeless tobacco  . Not on file   Colonoscopy: 01/2014 with repeat in 10 years  Influenza Vaccine: UTD Pneumovax: UTD Td/Tdap: UTD Zoster: UTD    Problems Prior to Visit 1. DM2  Current Problems (verified) Patient Active Problem List   Diagnosis Date Noted  . Cervical radiculitis 07/09/2014  . Ganglion cyst 03/23/2014  . Encounter for monitoring statin therapy 02/24/2013  . Benign essential tremor 08/31/2011  . Hyperlipemia 08/31/2011  . Type 2 diabetes mellitus ([place]) 01/07/2010  . COLONIC POLYPS 01/01/2009  . DIVERTICULOSIS OF COLON 01/01/2009  . SMOKER 12/14/2008    Medications Prior to Visit Current Outpatient Prescriptions on File Prior to Visit  Medication Sig Dispense Refill  . aspirin 81 MG tablet Take 81 mg by mouth daily.      Marland Kitchen glucose blood (ACCU-CHEK AVIVA) test strip Test blood sugar twice daily. Dx: E11.9 60 each 12  . Multiple Vitamins-Minerals (MULTIVITAMIN PO) Take by mouth.    . primidone (MYSOLINE) 50 MG tablet Take 1 tablet (50 mg total) by mouth daily. 90 tablet 3   No current facility-administered medications on file prior to visit.    Current Medications (verified) Current Outpatient Prescriptions  Medication Sig Dispense Refill  . aspirin 81 MG tablet Take 81 mg by mouth daily.      Marland Kitchen glucose blood (ACCU-CHEK AVIVA) test strip Test blood sugar twice daily. Dx: E11.9 60 each 12  . lovastatin (MEVACOR) 20 MG tablet Take 1 tablet (20 mg total) by mouth at bedtime. 90 tablet 3  . Multiple Vitamins-Minerals (MULTIVITAMIN PO) Take by mouth.    . primidone (MYSOLINE) 50 MG tablet Take 1 tablet (50 mg total) by mouth daily. 90 tablet 3   No current facility-administered medications for this visit.      Allergies (verified) Review of patient's allergies indicates no known allergies.   PAST HISTORY  Family History Family History  Problem Relation Age of Onset  . Cancer Father 56    colon    Social History Social History  Substance Use Topics  . Smoking status: Former Smoker    Quit date: 09/30/2009  . Smokeless tobacco: Not on file  . Alcohol Use: Not on file    Are there smokers in your home (other than you)?  No  Risk Factors Current exercise habits: Home exercise routine includes golf.  Dietary issues discussed: DASH   Cardiac risk factors: advanced age (older than 44 for men, 8 for women), diabetes mellitus, dyslipidemia and male gender.  Depression Screen (Note: if answer to either of the following is "Yes", a more complete depression screening is indicated)   Q1: Over the past two weeks, have you felt down, depressed or hopeless? No  Q2: Over the past two weeks, have you felt little interest or pleasure in doing things? No  Have you lost interest or pleasure in daily life? No  Do you often feel hopeless? No  Do you cry easily over simple problems? No  Activities of Daily Living In your present state of health, do you have any difficulty performing the following activities?:  Driving? No Managing money?  No Feeding yourself? No Getting from bed to chair? No Climbing a flight of stairs? No Preparing food and eating?: No Bathing or showering?  No Getting dressed: No Getting to the toilet? No Using the toilet:No Moving around from place to place: No In the past year have you fallen or had a near fall?:No   Are you sexually active?  No  Do you have more than one partner?  No  Hearing Difficulties: No Do you often ask people to speak up or repeat themselves? No Do you experience ringing or noises in your ears? No Do you have difficulty understanding soft or whispered voices? No   Do you feel that you have a problem with memory? No  Do you often  misplace items? No  Do you feel safe at home?  No  Cognitive Testing  Alert? Yes  Normal Appearance?Yes  Oriented to person? Yes  Place? Yes   Time? Yes  Recall of three objects?  Yes  Can perform simple calculations? Yes  Displays appropriate judgment?Yes  Can read the correct time from a watch face?Yes   Advanced Directives have been discussed with the patient? Yes   List the Names of Other Physician/Practitioners you currently use: 1.  Dr. Berdine Addison and Phillis Knack any recent Medical Services you may have received from other than [facility] providers in the past year (date may be approximate).  Immunization History  Administered Date(s) Administered  . H1N1 12/14/2008  . Influenza Split 09/04/2011, 09/03/2012  . Influenza Whole 09/06/2009, 09/20/2010, 08/20/2013  . Influenza-Unspecified 08/20/2014, 07/22/2015  . Pneumococcal Conjugate-13 04/23/2015  . Pneumococcal Polysaccharide-23 09/20/2010  . Td 12/14/2008  . Zoster 02/29/2012    Screening Tests Health Maintenance  Topic Date Due  . Hepatitis C Screening  03-26-1946  . OPHTHALMOLOGY EXAM  05/20/2014  . FOOT EXAM  12/22/2014  . INFLUENZA VACCINE  06/21/2015  . URINE MICROALBUMIN  10/22/2015  . HEMOGLOBIN A1C  03/19/2016  . PNA vac Low Risk Adult (2 of 2 - PPSV23) 04/22/2016  . TETANUS/TDAP  12/14/2018  . COLONOSCOPY  01/22/2024  . ZOSTAVAX  Completed    All answers were reviewed with the patient and necessary referrals were made:  Marcial Pacas, DO   09/24/2015   History reviewed: allergies, current medications, past family history, past medical history, past social history, past surgical history and problem list  Review of Systems Review of Systems - General ROS: negative for - chills, fever, night sweats, weight gain or weight loss Ophthalmic ROS: negative for - decreased vision Psychological ROS: negative for - anxiety or depression ENT ROS: negative for - hearing change, nasal congestion, tinnitus or  allergies Hematological and Lymphatic ROS: negative for - bleeding problems, bruising or swollen lymph nodes Breast ROS: negative Respiratory ROS: no cough, shortness of breath, or wheezing Cardiovascular ROS: no chest pain or dyspnea on exertion Gastrointestinal ROS: no abdominal pain, change in bowel habits, or black or bloody stools Genito-Urinary ROS: negative for - genital discharge, genital ulcers, incontinence or abnormal bleeding from genitals Musculoskeletal ROS: negative for - joint pain or muscle pain Neurological ROS: negative for - headaches or memory loss Dermatological ROS: negative for lumps, mole changes, rash and skin lesion changes   Objective:     Vision by Snellen chart: right eye:20/30, left eye:20/20 Blood pressure 134/80, pulse 66, weight 178 lb (80.74 kg). Body mass index is 25.54 kg/(m^2).  General: No Acute Distress HEENT: Atraumatic, normocephalic, conjunctivae normal without scleral icterus.  No nasal discharge, hearing grossly intact, TMs with good landmarks bilaterally with no middle ear abnormalities, posterior pharynx clear without oral lesions. Neck: Supple, trachea midline, no cervical nor  supraclavicular adenopathy. Pulmonary: Clear to auscultation bilaterally without wheezing, rhonchi, nor rales. Cardiac: Regular rate and rhythm.  No murmurs, rubs, nor gallops. No peripheral edema.  2+ peripheral pulses bilaterally. Abdomen: Bowel sounds normal.  No masses.  Non-tender without rebound.  Negative Murphy's sign. MSK: Grossly intact, no signs of weakness.  Full strength throughout upper and lower extremities.  Full ROM in upper and lower extremities.  No midline spinal tenderness. Neuro: Gait unremarkable, CN II-XII grossly intact.  C5-C6 Reflex 2/4 Bilaterally, L4 Reflex 2/4 Bilaterally.  Cerebellar function intact. Skin: No rashes. Psych: Alert and oriented to person/place/time.  Thought process normal. No anxiety/depression.     Assessment:      Normal wellness exam     Plan:     During the course of the visit the patient was educated and counseled about appropriate screening and preventive services including:    Labs and vaccines UTD  Diet review for nutrition referral? Yes ____  Not Indicated ____   Patient Instructions (the written plan) was given to the patient.  Medicare Attestation I have personally reviewed: The patient's medical and social history Their use of alcohol, tobacco or illicit drugs Their current medications and supplements The patient's functional ability including ADLs,fall risks, home safety risks, cognitive, and hearing and visual impairment Diet and physical activities Evidence for depression or mood disorders  The patient's weight, height, BMI, and visual acuity have been recorded in the chart.  I have made referrals, counseling, and provided education to the patient based on review of the above and I have provided the patient with a written personalized care plan for preventive services.     Marcial Pacas, DO   09/24/2015

## 2015-10-10 DIAGNOSIS — M542 Cervicalgia: Secondary | ICD-10-CM | POA: Diagnosis not present

## 2015-10-10 DIAGNOSIS — M5412 Radiculopathy, cervical region: Secondary | ICD-10-CM | POA: Diagnosis not present

## 2015-11-01 DIAGNOSIS — L821 Other seborrheic keratosis: Secondary | ICD-10-CM | POA: Diagnosis not present

## 2015-11-01 DIAGNOSIS — L570 Actinic keratosis: Secondary | ICD-10-CM | POA: Diagnosis not present

## 2015-11-01 DIAGNOSIS — D2239 Melanocytic nevi of other parts of face: Secondary | ICD-10-CM | POA: Diagnosis not present

## 2015-11-09 DIAGNOSIS — M542 Cervicalgia: Secondary | ICD-10-CM | POA: Diagnosis not present

## 2015-11-09 DIAGNOSIS — M5412 Radiculopathy, cervical region: Secondary | ICD-10-CM | POA: Diagnosis not present

## 2016-03-27 ENCOUNTER — Ambulatory Visit (INDEPENDENT_AMBULATORY_CARE_PROVIDER_SITE_OTHER): Payer: Medicare Other | Admitting: Family Medicine

## 2016-03-27 ENCOUNTER — Encounter: Payer: Self-pay | Admitting: Family Medicine

## 2016-03-27 VITALS — BP 131/77 | HR 67 | Wt 179.0 lb

## 2016-03-27 DIAGNOSIS — E119 Type 2 diabetes mellitus without complications: Secondary | ICD-10-CM | POA: Diagnosis not present

## 2016-03-27 DIAGNOSIS — M7701 Medial epicondylitis, right elbow: Secondary | ICD-10-CM | POA: Diagnosis not present

## 2016-03-27 DIAGNOSIS — E785 Hyperlipidemia, unspecified: Secondary | ICD-10-CM | POA: Diagnosis not present

## 2016-03-27 MED ORDER — AMBULATORY NON FORMULARY MEDICATION: Status: AC

## 2016-03-27 MED ORDER — AMBULATORY NON FORMULARY MEDICATION: Status: DC

## 2016-03-27 NOTE — Progress Notes (Signed)
CC: Patrick Perkins is a 70 y.o. male is here for Hyperglycemia and Hyperlipidemia   Subjective: HPI:  Follow-up type 2 diabetes: Currently not taking any anti-hyperglycemics. He's exercising daily with golf and softball. No outside blood sugars to report. Denies polyuria polyphagia or polydipsia. No hypoglycemic episodes.  Follow-up hyperlipidemia: Taking lovastatin on a daily basis. No right upper quadrant pain or myalgias. Cholesterol was checked back in October and was favorable. No chest pain nor limb claudication.  His only complaint today is right elbow pain present for the last 2 weeks.  Started while he was playing softball. It's worse with any flexion of the wrist. Slightly improved with wearing a compression sleeve. No swelling redness warmth or bruising at the site of discomfort. Localized to the medial aspect of the elbow. He denies joint pain elsewhere   Review Of Systems Outlined In HPI  Past Medical History  Diagnosis Date  . ED (erectile dysfunction)   . Diabetes mellitus     Past Surgical History  Procedure Laterality Date  . Hernia repair  2005    double   Family History  Problem Relation Age of Onset  . Cancer Father 26    colon    Social History   Social History  . Marital Status: Married    Spouse Name: N/A  . Number of Children: N/A  . Years of Education: N/A   Occupational History  . Not on file.   Social History Main Topics  . Smoking status: Former Smoker    Quit date: 09/30/2009  . Smokeless tobacco: Not on file  . Alcohol Use: Not on file  . Drug Use: Not on file  . Sexual Activity: Not on file   Other Topics Concern  . Not on file   Social History Narrative     Objective: BP 131/77 mmHg  Pulse 67  Wt 179 lb (81.194 kg)  General: Alert and Oriented, No Acute Distress HEENT: Pupils equal, round, reactive to light. Conjunctivae clear.Moist mucous membranes Lungs: Clear to auscultation bilaterally, no wheezing/ronchi/rales.   Comfortable work of breathing. Good air movement. Cardiac: Regular rate and rhythm. Normal S1/S2.  No murmurs, rubs, nor gallops.   Extremities: No peripheral edema.  Strong peripheral pulses. Elbow pain reproduced with palpation of the medial epicondyle of the right elbow. Mental Status: No depression, anxiety, nor agitation. Skin: Warm and dry.  Assessment & Plan: Patrick Perkins was seen today for hyperglycemia and hyperlipidemia.  Diagnoses and all orders for this visit:  Type 2 diabetes mellitus without complication, without long-term current use of insulin (HCC) -     POCT HgB A1C -     AMBULATORY NON FORMULARY MEDICATION; One Touch Verio glucose monitor.  Check blood sugar daily. Dx type 2 diabetes. E11.9 -     AMBULATORY NON FORMULARY MEDICATION; One Touch Verio glucose monitor test strips and lancets.  Check blood sugar daily. Dx type 2 diabetes. E11.9  Hyperlipemia  Golfer's elbow, right   Type 2 diabetes: A1c of 6.1, controlled with only diet and exercise. Hyperlipidemia: Controlled with lovastatin, due for repeat lipid panel in 6 months. No need for blood work today. Golfer's elbow: He was counseled on a home rehabilitation plan to help reduce pain and help with healing.  Return in about 6 months (around 09/27/2016) for annual.

## 2016-10-03 ENCOUNTER — Encounter: Payer: Self-pay | Admitting: Osteopathic Medicine

## 2016-10-03 ENCOUNTER — Encounter: Payer: TRICARE For Life (TFL) | Admitting: Family Medicine

## 2016-10-03 ENCOUNTER — Ambulatory Visit (INDEPENDENT_AMBULATORY_CARE_PROVIDER_SITE_OTHER): Payer: Medicare Other | Admitting: Osteopathic Medicine

## 2016-10-03 VITALS — BP 135/85 | HR 68 | Ht 71.0 in | Wt 181.0 lb

## 2016-10-03 DIAGNOSIS — Z87891 Personal history of nicotine dependence: Secondary | ICD-10-CM

## 2016-10-03 DIAGNOSIS — E119 Type 2 diabetes mellitus without complications: Secondary | ICD-10-CM | POA: Diagnosis not present

## 2016-10-03 DIAGNOSIS — Z0000 Encounter for general adult medical examination without abnormal findings: Secondary | ICD-10-CM | POA: Diagnosis not present

## 2016-10-03 LAB — HEPATIC FUNCTION PANEL
ALBUMIN: 4.4 g/dL (ref 3.6–5.1)
ALK PHOS: 70 U/L (ref 40–115)
ALT: 17 U/L (ref 9–46)
AST: 19 U/L (ref 10–35)
Bilirubin, Direct: 0.1 mg/dL (ref <=0.2)
Indirect Bilirubin: 0.4 mg/dL (ref 0.2–1.2)
TOTAL PROTEIN: 6.8 g/dL (ref 6.1–8.1)
Total Bilirubin: 0.5 mg/dL (ref 0.2–1.2)

## 2016-10-03 LAB — BASIC METABOLIC PANEL WITH GFR
BUN: 15 mg/dL (ref 7–25)
CO2: 28 mmol/L (ref 20–31)
Calcium: 9.4 mg/dL (ref 8.6–10.3)
Chloride: 103 mmol/L (ref 98–110)
Creat: 1.04 mg/dL (ref 0.70–1.25)
GFR, EST AFRICAN AMERICAN: 84 mL/min (ref >=60)
GFR, Est Non African American: 73 mL/min (ref >=60)
GLUCOSE: 128 mg/dL — AB (ref 65–99)
POTASSIUM: 4.7 mmol/L (ref 3.5–5.3)
Sodium: 141 mmol/L (ref 135–146)

## 2016-10-03 LAB — LIPID PANEL
Cholesterol: 167 mg/dL (ref <200)
HDL: 75 mg/dL (ref >40)
LDL Cholesterol: 70 mg/dL (ref <100)
Total CHOL/HDL Ratio: 2.2 Ratio (ref <5.0)
Triglycerides: 108 mg/dL (ref <150)
VLDL: 22 mg/dL (ref <30)

## 2016-10-03 LAB — POCT GLYCOSYLATED HEMOGLOBIN (HGB A1C): Hemoglobin A1C: 6.3

## 2016-10-03 LAB — POCT UA - MICROALBUMIN
Albumin/Creatinine Ratio, Urine, POC: <30
CREATININE, POC: 300 mg/dL
MICROALBUMIN (UR) POC: 30 mg/L

## 2016-10-03 NOTE — Progress Notes (Signed)
Subjective:    Patrick Perkins is a 70 y.o. male who presents for Medicare Annual/Subsequent preventive examination.   Preventive Screening-Counseling & Management  Tobacco History  Smoking Status  . Former Smoker  . Quit date: 09/30/2009  Smokeless Tobacco  . Never Used    DIABETES SCREENING/PREVENTIVE CARE: Updated 10/03/16  A1C past 3-6 mos: Yes  controlled? Yes 6.3% 10/03/16   BP goal <140/90: Yes  LDL goal <70: Yes  Eye exam annually: Yes , importance discussed with patient Foot exam: Yes 10/03/16  Microalbuminuria:No  Metformin: No  ACE/ARB: No  Antiplatelet if ASCVD Risk >10%: No  Statin: Yes  Pneumovax: Yes    Problems Prior to Visit 1. See below  Current Problems (verified) Patient Active Problem List   Diagnosis Date Noted  . Cervical radiculitis 07/09/2014  . Ganglion cyst 03/23/2014  . Encounter for monitoring statin therapy 02/24/2013  . Benign essential tremor 08/31/2011  . Hyperlipemia 08/31/2011  . Type 2 diabetes mellitus ([place]) 01/07/2010  . COLONIC POLYPS 01/01/2009  . DIVERTICULOSIS OF COLON 01/01/2009  . Former smoker 12/14/2008    Medications Prior to Visit Current Outpatient Prescriptions on File Prior to Visit  Medication Sig Dispense Refill  . AMBULATORY NON FORMULARY MEDICATION One Touch Verio glucose monitor.  Check blood sugar daily. Dx type 2 diabetes. E11.9 1 Units 0  . AMBULATORY NON FORMULARY MEDICATION One Touch Verio glucose monitor test strips and lancets.  Check blood sugar daily. Dx type 2 diabetes. E11.9 50 Units 11  . aspirin 81 MG tablet Take 81 mg by mouth daily.      Marland Kitchen glucose blood (ACCU-CHEK AVIVA) test strip Test blood sugar twice daily. Dx: E11.9 60 each 12  . lovastatin (MEVACOR) 20 MG tablet Take 1 tablet (20 mg total) by mouth at bedtime. 90 tablet 3  . Multiple Vitamins-Minerals (MULTIVITAMIN PO) Take by mouth.    . primidone (MYSOLINE) 50 MG tablet Take 1 tablet (50 mg total) by mouth daily. 90 tablet 3    No current facility-administered medications on file prior to visit.     Current Medications (verified) Current Outpatient Prescriptions  Medication Sig Dispense Refill  . AMBULATORY NON FORMULARY MEDICATION One Touch Verio glucose monitor.  Check blood sugar daily. Dx type 2 diabetes. E11.9 1 Units 0  . AMBULATORY NON FORMULARY MEDICATION One Touch Verio glucose monitor test strips and lancets.  Check blood sugar daily. Dx type 2 diabetes. E11.9 50 Units 11  . aspirin 81 MG tablet Take 81 mg by mouth daily.      Marland Kitchen glucose blood (ACCU-CHEK AVIVA) test strip Test blood sugar twice daily. Dx: E11.9 60 each 12  . lovastatin (MEVACOR) 20 MG tablet Take 1 tablet (20 mg total) by mouth at bedtime. 90 tablet 3  . Multiple Vitamins-Minerals (MULTIVITAMIN PO) Take by mouth.    . primidone (MYSOLINE) 50 MG tablet Take 1 tablet (50 mg total) by mouth daily. 90 tablet 3   No current facility-administered medications for this visit.      Allergies (verified) Patient has no known allergies.   PAST HISTORY  Family History Family History  Problem Relation Age of Onset  . Cancer Father 81    colon    Social History Social History  Substance Use Topics  . Smoking status: Former Smoker    Quit date: 09/30/2009  . Smokeless tobacco: Never Used  . Alcohol use Not on file    Are there smokers in your home (other than you)?  No  Risk Factors Current exercise habits: golf, walking, yard work  Dietary issues discussed: no concerns  Cardiac risk factors: advanced age (older than 11 for men, 44 for women), diabetes mellitus, dyslipidemia, hypertension, male gender and obesity (BMI >= 30 kg/m2).  Depression Screen (Note: if answer to either of the following is "Yes", a more complete depression screening is indicated)   Q1: Over the past two weeks, have you felt down, depressed or hopeless? No  Q2: Over the past two weeks, have you felt little interest or pleasure in doing things? No  Have  you lost interest or pleasure in daily life? No  Do you often feel hopeless? No  Do you cry easily over simple problems? No  Activities of Daily Living In your present state of health, do you have any difficulty performing the following activities?:  Driving? No Managing money?  No Feeding yourself? No Getting from bed to chair? No Climbing a flight of stairs? No Preparing food and eating?: No Bathing or showering? No Getting dressed: No Getting to the toilet? No Using the toilet:No Moving around from place to place: No In the past year have you fallen or had a near fall?:No   Are you sexually active?  No  Do you have more than one partner?  No  Hearing Difficulties: No Do you often ask people to speak up or repeat themselves? No Do you experience ringing or noises in your ears? No Do you have difficulty understanding soft or whispered voices? Yes   Do you feel that you have a problem with memory? No  Do you often misplace items? No  Do you feel safe at home?  Yes  Cognitive Testing  Alert? Yes  Normal Appearance?Yes  Oriented to person? Yes  Place? Yes   Time? Yes  Recall of three objects?  Yes  Can perform simple calculations? Yes  Displays appropriate judgment?Yes  Can read the correct time from a watch face?Yes   Advanced Directives have been discussed with the patient? Yes   List the Names of Other Physician/Practitioners you currently use: 1.  Dr Kenton Kingfisher - [facility] 2. Dr Berdine Addison - Neurology 3. Dr Tamala Julian - Dermatology   Indicate any recent Medical Services you may have received from other than [facility] providers in the past year (date may be approximate).  Immunization History  Administered Date(s) Administered  . H1N1 12/14/2008  . Influenza Split 09/04/2011, 09/03/2012  . Influenza Whole 09/06/2009, 09/20/2010, 08/20/2013  . Influenza-Unspecified 08/20/2014, 07/22/2015, 09/03/2016  . Pneumococcal Conjugate-13 04/23/2015  . Pneumococcal Polysaccharide-23  09/20/2010  . Td 12/14/2008  . Zoster 02/29/2012    Screening Tests Health Maintenance  Topic Date Due  . OPHTHALMOLOGY EXAM  05/20/2014  . FOOT EXAM  12/22/2014  . URINE MICROALBUMIN  10/22/2015  . PNA vac Low Risk Adult (2 of 2 - PPSV23) 04/22/2016  . Hepatitis C Screening  11/20/2018 (Originally January 12, 1946)  . HEMOGLOBIN A1C  04/02/2017  . TETANUS/TDAP  12/14/2018  . COLONOSCOPY  01/22/2024  . INFLUENZA VACCINE  Completed  . ZOSTAVAX  Completed    All answers were reviewed with the patient and necessary referrals were made:  Emeterio Reeve, DO   10/03/2016   History reviewed: allergies, current medications, past family history, past medical history, past social history, past surgical history and problem list  Review of Systems Pertinent items are noted in HPI.    Objective:     Vision by Snellen chart: right eye:20/25, left eye:20/20 Blood pressure  135/85, pulse 68, height 5\' 11"  (1.803 m), weight 181 lb (82.1 kg). Body mass index is 25.24 kg/m.   Results for orders placed or performed in visit on 10/03/16 (from the past 24 hour(s))  POCT HgB A1C     Status: None   Collection Time: 10/03/16  8:46 AM  Result Value Ref Range   Hemoglobin A1C 6.3   POCT UA - Microalbumin     Status: Normal   Collection Time: 10/03/16  9:50 AM  Result Value Ref Range   Microalbumin Ur, POC 30 mg/L   Creatinine, POC 300 mg/dL   Albumin/Creatinine Ratio, Urine, POC <30    Exam:   Constitutional: VS see above. General Appearance: alert, well-developed, well-nourished, NAD  Eyes: Normal lids and conjunctive, non-icteric sclera  Ears, Nose, Mouth, Throat: MMM, Normal external inspection ears/nares/mouth/lips/gums. TM normal bilaterally. Pharynx/tonsils no erythema, no exudate. Nasal mucosa normal.   Neck: No masses, trachea midline. No thyroid enlargement. No tenderness/mass appreciated. No lymphadenopathy  Respiratory: Normal respiratory effort. no wheeze, no rhonchi, no  rales  Cardiovascular: S1/S2 normal, no murmur, no rub/gallop auscultated. RRR. No lower extremity edema. Pedal pulse II/IV bilaterally DP and PT. No carotid bruit or JVD. No abdominal aortic bruit.  Gastrointestinal: Nontender, no masses. No hepatomegaly, no splenomegaly. No hernia appreciated. Bowel sounds normal. Rectal exam deferred.   Musculoskeletal: Gait normal. No clubbing/cyanosis of digits.   Neurological: Normal balance/coordination. No tremor. No cranial nerve deficit on limited exam. Motor and sensation intact and symmetric. Cerebellar reflexes intact.   Skin: warm, dry, intact. No rash/ulcer. No concerning nevi or subq nodules on limited exam.    Psychiatric: Normal judgment/insight. Normal mood and affect. Oriented x3.      Assessment:     Encounter for Medicare annual wellness exam - Plan: Basic Metabolic Panel with GFR, Hepatic function panel, Lipid panel  Diabetes mellitus without complication ([place]) - Plan: POCT HgB A1C, POCT UA - Microalbumin, Basic Metabolic Panel with GFR, Hepatic function panel, Lipid panel  Former smoker - Plan: US Aorta Initial Medicare Screen  MALE PREVENTIVE CARE  updated 10/03/16  ANNUAL SCREENING/COUNSELING  Any changes to health in the past year? no  Diet/Exercise - HEALTHY HABITS DISCUSSED TO DECREASE CV RISK History  Smoking Status  . Former Smoker  . Quit date: 09/30/2009  Smokeless Tobacco  . Never Used    History  Alcohol use Not on file    Depression screen [facility] 2/9 10/03/2016  Decreased Interest 0  Down, Depressed, Hopeless 0  PHQ - 2 Score 0     Domestic violence concerns - no  HTN SCREENING - SEE [place]  Sexually active in the past year? - No   STI testing needed/desired today? - no  Any concerns with testosterone/libido? - no  INFECTIOUS DISEASE SCREENING  HIV - does not need  GC/CT - does not need  HepC - needs  TB - does not need  CANCER SCREENING  Lung -  USPSTF: 55-80yo w/ 30 py hx unless quit w/in 26yr - needs, will think about it   Colon - does not need  Prostate - does not need  OTHER DISEASE SCREENING  Lipid - needs  DM2 - needs  AAA - 65-75yo ever smoked: needs  Osteoporosis - men 70yo+ - does not need  Fracture after age 50? no  Chronic steroid use? no  Smoking/Alcohol? no  Low body weight <127lb? no  Hip fracture in parent? no  Malabsorbtion, CLD, IBD, RA? no  ADULT VACCINATION  Influenza - annual vaccine recommended  Td - booster every 10 years   Zoster - option at 14, yes at 60+   PCV13 - already has  PPSV23 - already has Immunization History  Administered Date(s) Administered  . H1N1 12/14/2008  . Influenza Split 09/04/2011, 09/03/2012  . Influenza Whole 09/06/2009, 09/20/2010, 08/20/2013  . Influenza-Unspecified 08/20/2014, 07/22/2015, 09/03/2016  . Pneumococcal Conjugate-13 04/23/2015  . Pneumococcal Polysaccharide-23 09/20/2010  . Td 12/14/2008  . Zoster 02/29/2012    OTHER  Fall - exercise and Vit D age 29+ - needs  Consider ASA - age 94-59 - does not need      Plan:     During the course of the visit the patient was educated and counseled about appropriate screening and preventive services including:    Advanced directives: has an advanced directive - a copy HAS NOT been provided.   Pt will think about CT for lung cancer screening   Diet review for nutrition referral? Yes __x__  Not Indicated __x__   Patient Instructions (the written plan) was given to the patient.  Medicare Attestation I have personally reviewed: The patient's medical and social history Their use of alcohol, tobacco or illicit drugs Their current medications and supplements The patient's functional ability including ADLs,fall risks, home safety risks, cognitive, and hearing and visual impairment Diet and physical activities Evidence for depression or mood disorders  The patient's weight, height, BMI,  and visual acuity have been recorded in the chart.  I have made referrals, counseling, and provided education to the patient based on review of the above and I have provided the patient with a written personalized care plan for preventive services.     Emeterio Reeve, DO   10/03/2016     Return in 6 months (on 04/02/2017) for A1C RECHECK or sooner if needed.

## 2016-10-05 ENCOUNTER — Telehealth: Payer: Self-pay | Admitting: Osteopathic Medicine

## 2016-10-05 DIAGNOSIS — Z87891 Personal history of nicotine dependence: Secondary | ICD-10-CM

## 2016-10-05 NOTE — Telephone Encounter (Signed)
Order changed.

## 2016-10-05 NOTE — Telephone Encounter (Signed)
-----   Message from Katha Hamming sent at 10/04/2016  2:14 PM EST ----- Regarding: US aorta Jeanmarie Hubert:  This order needs to be changed to US aorta.   The patient has had an aorta done in 2010, so it can't be an initial medicare screening which is what is ordered now.   Thanks for your help, Hoyle Sauer

## 2016-10-11 ENCOUNTER — Other Ambulatory Visit: Payer: Self-pay | Admitting: *Deleted

## 2016-10-11 MED ORDER — LOVASTATIN 20 MG PO TABS: 20.0000 mg | ORAL_TABLET | Freq: Every day | ORAL | 3 refills | Status: DC

## 2016-12-19 ENCOUNTER — Ambulatory Visit (INDEPENDENT_AMBULATORY_CARE_PROVIDER_SITE_OTHER): Payer: Medicare Other | Admitting: Osteopathic Medicine

## 2016-12-19 VITALS — BP 148/80 | HR 72 | Ht 70.5 in | Wt 185.0 lb

## 2016-12-19 DIAGNOSIS — Z8719 Personal history of other diseases of the digestive system: Secondary | ICD-10-CM | POA: Diagnosis not present

## 2016-12-19 MED ORDER — LOVASTATIN 20 MG PO TABS: 20.0000 mg | ORAL_TABLET | Freq: Every day | ORAL | 3 refills | Status: DC

## 2016-12-19 NOTE — Patient Instructions (Signed)
If symptoms worsen or fail to improve, please let us know! If pain is severe or bulge isn't going away, if abdominal pain, nausea or fever, please seek emergency care.

## 2016-12-19 NOTE — Progress Notes (Signed)
HPI: Patrick Perkins is a 71 y.o. male  who presents to [facility] today, 12/19/16,  for chief complaint of:  Chief Complaint  Patient presents with  . Other    POSSIBLE HERNIA    . Context: HX R inguinal hernia, last seen by surgery 2014 - no recurrence at that time on R groin. S/p open R inguinal hernia repair 2013.  Marland Kitchen Location: R groin . Quality: "pressing pain" in R groin . Severity: not bothering him now . Duration: last week was bothering him but not not a problem, Hx recurrent hernias    Past medical history, surgical history, social history and family history reviewed.  Patient Active Problem List   Diagnosis Date Noted  . Cervical radiculitis 07/09/2014  . Ganglion cyst 03/23/2014  . Encounter for monitoring statin therapy 02/24/2013  . Benign essential tremor 08/31/2011  . Hyperlipemia 08/31/2011  . Type 2 diabetes mellitus ([place]) 01/07/2010  . COLONIC POLYPS 01/01/2009  . DIVERTICULOSIS OF COLON 01/01/2009  . Former smoker 12/14/2008   Past Surgical History:  Procedure Laterality Date  . HERNIA REPAIR  2005   double    Current medication list and allergy/intolerance information reviewed.   Current Outpatient Prescriptions on File Prior to Visit  Medication Sig Dispense Refill  . AMBULATORY NON FORMULARY MEDICATION One Touch Verio glucose monitor.  Check blood sugar daily. Dx type 2 diabetes. E11.9 1 Units 0  . AMBULATORY NON FORMULARY MEDICATION One Touch Verio glucose monitor test strips and lancets.  Check blood sugar daily. Dx type 2 diabetes. E11.9 50 Units 11  . aspirin 81 MG tablet Take 81 mg by mouth daily.      Marland Kitchen glucose blood (ACCU-CHEK AVIVA) test strip Test blood sugar twice daily. Dx: E11.9 60 each 12  . lovastatin (MEVACOR) 20 MG tablet Take 1 tablet (20 mg total) by mouth at bedtime. 90 tablet 3  . Multiple Vitamins-Minerals (MULTIVITAMIN PO) Take by mouth.    . primidone (MYSOLINE) 50 MG tablet Take 1 tablet  (50 mg total) by mouth daily. 90 tablet 3   No current facility-administered medications on file prior to visit.    No Known Allergies    Review of Systems:  Constitutional: No recent illness  HEENT: No  headache, no vision change  Cardiac: No  chest pain, No  pressure, No palpitations  Respiratory:  No  shortness of breath. No  Cough  Gastrointestinal: No  abdominal pain, no change on bowel habits  Musculoskeletal: No new myalgia/arthralgia  Skin: No  Rash  Exam:  BP (!) 148/80   Pulse 72   Ht 5' 10.5" (1.791 m)   Wt 185 lb (83.9 kg)   BMI 26.17 kg/m   Constitutional: VS see above. General Appearance: alert, well-developed, well-nourished, NAD  Respiratory: Normal respiratory effort.   Musculoskeletal: Gait normal.  Groin: no palpable R inguinal hernia w/ valsalva   Skin: warm, dry, intact.   Psychiatric: Normal judgment/insight. Normal mood and affect. Oriented x3.      ASSESSMENT/PLAN:   No symptoms and nothing on exam at this point, watchful waiting, surgical referral if happens again. Possible discomfort from scar tissue/adhesions, advised on what to look for for severe hernia/strangulation.   History of inguinal hernia    Patient Instructions  If symptoms worsen or fail to improve, please let us know! If pain is severe or bulge isn't going away, if abdominal pain, nausea or fever, please seek emergency care.  Follow-up plan: Return for A1C recheck and BP recheck .  Visit summary with medication list and pertinent instructions was printed for patient to review, alert [place] if any changes needed. All questions at time of visit were answered - patient instructed to contact office with any additional concerns. ER/RTC precautions were reviewed with the patient and understanding verbalized.

## 2017-04-03 ENCOUNTER — Ambulatory Visit (INDEPENDENT_AMBULATORY_CARE_PROVIDER_SITE_OTHER): Payer: Medicare Other | Admitting: Osteopathic Medicine

## 2017-04-03 ENCOUNTER — Encounter: Payer: Self-pay | Admitting: Osteopathic Medicine

## 2017-04-03 VITALS — BP 136/61 | HR 69 | Ht 70.5 in | Wt 181.0 lb

## 2017-04-03 DIAGNOSIS — E119 Type 2 diabetes mellitus without complications: Secondary | ICD-10-CM

## 2017-04-03 DIAGNOSIS — E785 Hyperlipidemia, unspecified: Secondary | ICD-10-CM | POA: Diagnosis not present

## 2017-04-03 DIAGNOSIS — Z9189 Other specified personal risk factors, not elsewhere classified: Secondary | ICD-10-CM | POA: Diagnosis not present

## 2017-04-03 DIAGNOSIS — Z23 Encounter for immunization: Secondary | ICD-10-CM

## 2017-04-03 LAB — POCT GLYCOSYLATED HEMOGLOBIN (HGB A1C): Hemoglobin A1C: 6.1

## 2017-04-03 MED ORDER — ZOSTER VAC RECOMB ADJUVANTED 50 MCG/0.5ML IM SUSR: 0.5000 mL | Freq: Once | INTRAMUSCULAR | 1 refills | Status: AC

## 2017-04-03 NOTE — Progress Notes (Signed)
HPI: Patrick Perkins is a 71 y.o. male  who presents to [facility] today, 04/03/17,  for chief complaint of:  Chief Complaint  Patient presents with  . Follow-up    DIABETES     DIABETES SCREENING/PREVENTIVE CARE: Updated 04/03/17  A1C past 3-6 mos: Yes  controlled? Yes   6.3% 10/03/16    6.1% 04/03/17 BP goal <140/90: Yes  LDL goal <70: Yes  Eye exam annually: Yes , importance discussed with patient Foot exam: Yes 10/03/16  Microalbuminuria: No  Metformin: No - declined ACE/ARB: No  Antiplatelet if ASCVD Risk >10%: No  Statin: Yes  Pneumovax: Yes 09/20/2010 (Age 34yo and >5 years ago) - due for repeat   HLD: doing well on current medications, minimal unhealthy foods     Immunization History  Administered Date(s) Administered  . H1N1 12/14/2008  . Influenza Split 09/04/2011, 09/03/2012  . Influenza Whole 09/06/2009, 09/20/2010, 08/20/2013  . Influenza-Unspecified 08/20/2014, 07/22/2015, 09/03/2016  . Pneumococcal Conjugate-13 04/23/2015  . Pneumococcal Polysaccharide-23 09/20/2010  . Td 12/14/2008  . Zoster 02/29/2012      Past medical history, surgical history, social history and family history reviewed.  Patient Active Problem List   Diagnosis Date Noted  . Cervical radiculitis 07/09/2014  . Ganglion cyst 03/23/2014  . Encounter for monitoring statin therapy 02/24/2013  . Benign essential tremor 08/31/2011  . Hyperlipemia 08/31/2011  . Type 2 diabetes mellitus ([place]) 01/07/2010  . COLONIC POLYPS 01/01/2009  . DIVERTICULOSIS OF COLON 01/01/2009  . Former smoker 12/14/2008    Current medication list and allergy/intolerance information reviewed.   Current Outpatient Prescriptions on File Prior to Visit  Medication Sig Dispense Refill  . AMBULATORY NON FORMULARY MEDICATION One Touch Verio glucose monitor.  Check blood sugar daily. Dx type 2 diabetes. E11.9 1 Units 0  . AMBULATORY NON FORMULARY MEDICATION One Touch  Verio glucose monitor test strips and lancets.  Check blood sugar daily. Dx type 2 diabetes. E11.9 50 Units 11  . aspirin 81 MG tablet Take 81 mg by mouth daily.      Marland Kitchen glucose blood (ACCU-CHEK AVIVA) test strip Test blood sugar twice daily. Dx: E11.9 60 each 12  . lovastatin (MEVACOR) 20 MG tablet Take 1 tablet (20 mg total) by mouth at bedtime. 90 tablet 3  . Multiple Vitamins-Minerals (MULTIVITAMIN PO) Take by mouth.    . primidone (MYSOLINE) 50 MG tablet Take 1 tablet (50 mg total) by mouth daily. (Patient taking differently: Take 50 mg by mouth 3 (three) times daily. ) 90 tablet 3   No current facility-administered medications on file prior to visit.    No Known Allergies    Review of Systems:  Constitutional: No recent illness, feels well today   Cardiac: No  chest pain  Respiratory:  No  shortness of breath.   Neurologic: No  weakness, No  Dizziness   Exam:  BP 136/61   Pulse 69   Ht 5' 10.5" (1.791 m)   Wt 181 lb (82.1 kg)   BMI 25.60 kg/m   Constitutional: VS see above. General Appearance: alert, well-developed, well-nourished, NAD  Eyes: Normal lids and conjunctive, non-icteric sclera  Ears, Nose, Mouth, Throat: MMM, Normal external inspection ears/nares/mouth/lips/gums.  Neck: No masses, trachea midline.   Respiratory: Normal respiratory effort. no wheeze, no rhonchi, no rales  Cardiovascular: S1/S2 normal, no murmur, no rub/gallop auscultated. RRR.   Musculoskeletal: Gait normal. Symmetric and independent movement of all extremities  Neurological: Normal balance/coordination. No tremor.  Skin: warm, dry, intact.   Psychiatric: Normal judgment/insight. Normal mood and affect. Oriented x3.    Recent Results (from the past 2160 hour(s))  POCT HgB A1C     Status: None   Collection Time: 04/03/17  8:19 AM  Result Value Ref Range   Hemoglobin A1C 6.1       ASSESSMENT/PLAN:   Diabetes mellitus without complication ([place]) - Plan: POCT HgB  A1C  Hyperlipidemia, unspecified hyperlipidemia type  Pneumococcal vaccination indicated - Plan: Pneumococcal polysaccharide vaccine 23-valent greater than or equal to 2yo subcutaneous/IM  Need for shingles vaccine - Plan: Zoster Vac Recomb Adjuvanted [facility]) injection    Patient Instructions  Plan:  Follow-up in 6 months for routine care / annual lab - please call us at least one week prior to your appointment to get lab orders in place!   Any questions or concerns in the meantime, please let us know!     Follow-up plan: Return in about 6 months (around 10/04/2017) for [place] .  Visit summary with medication list and pertinent instructions was printed for patient to review, alert [place] if any changes needed. All questions at time of visit were answered - patient instructed to contact office with any additional concerns. ER/RTC precautions were reviewed with the patient and understanding verbalized.   Note: Total time spent 15 minutes, greater than 50% of the visit was spent face-to-face counseling and coordinating care for the following: The primary encounter diagnosis was Diabetes mellitus without complication ([place]). Diagnoses of Hyperlipidemia, unspecified hyperlipidemia type, Pneumococcal vaccination indicated, and Need for shingles vaccine were also pertinent to this visit..  Established: 44818- 15 / 99214- 25 / [place]- 40 New: 56314- 20 / 97026- 32 / 99205- 77

## 2017-04-03 NOTE — Patient Instructions (Signed)
Plan:  Follow-up in 6 months for routine care / annual lab - please call us at least one week prior to your appointment to get lab orders in place!   Any questions or concerns in the meantime, please let us know!

## 2017-10-05 ENCOUNTER — Telehealth: Payer: Self-pay | Admitting: Osteopathic Medicine

## 2017-10-05 DIAGNOSIS — E119 Type 2 diabetes mellitus without complications: Secondary | ICD-10-CM

## 2017-10-05 DIAGNOSIS — E785 Hyperlipidemia, unspecified: Secondary | ICD-10-CM

## 2017-10-05 NOTE — Telephone Encounter (Signed)
Labs for upcoming visit

## 2017-10-06 LAB — COMPLETE METABOLIC PANEL WITH GFR
AG Ratio: 2.1 (calc) (ref 1.0–2.5)
ALBUMIN MSPROF: 4.8 g/dL (ref 3.6–5.1)
ALT: 22 U/L (ref 9–46)
AST: 19 U/L (ref 10–35)
Alkaline phosphatase (APISO): 81 U/L (ref 40–115)
BILIRUBIN TOTAL: 0.5 mg/dL (ref 0.2–1.2)
BUN: 16 mg/dL (ref 7–25)
CALCIUM: 9.3 mg/dL (ref 8.6–10.3)
CHLORIDE: 101 mmol/L (ref 98–110)
CO2: 32 mmol/L (ref 20–32)
CREATININE: 1.17 mg/dL (ref 0.70–1.18)
GFR, EST AFRICAN AMERICAN: 73 mL/min/{1.73_m2} (ref >=60)
GFR, EST NON AFRICAN AMERICAN: 63 mL/min/{1.73_m2} (ref >=60)
GLUCOSE: 152 mg/dL — AB (ref 65–99)
Globulin: 2.3 g/dL (calc) (ref 1.9–3.7)
Potassium: 4.6 mmol/L (ref 3.5–5.3)
Sodium: 139 mmol/L (ref 135–146)
TOTAL PROTEIN: 7.1 g/dL (ref 6.1–8.1)

## 2017-10-06 LAB — HEMOGLOBIN A1C
HEMOGLOBIN A1C: 6.1 %{Hb} — AB (ref <5.7)
Mean Plasma Glucose: 128 (calc)
eAG (mmol/L): 7.1 (calc)

## 2017-10-06 LAB — CBC
HCT: 45.2 % (ref 38.5–50.0)
Hemoglobin: 15.6 g/dL (ref 13.2–17.1)
MCH: 32.3 pg (ref 27.0–33.0)
MCHC: 34.5 g/dL (ref 32.0–36.0)
MCV: 93.6 fL (ref 80.0–100.0)
MPV: 11.2 fL (ref 7.5–12.5)
Platelets: 326 10*3/uL (ref 140–400)
RBC: 4.83 10*6/uL (ref 4.20–5.80)
RDW: 12.9 % (ref 11.0–15.0)
WBC: 7.8 10*3/uL (ref 3.8–10.8)

## 2017-10-06 LAB — LIPID PANEL
CHOL/HDL RATIO: 2.4 (calc) (ref <5.0)
Cholesterol: 176 mg/dL (ref <200)
HDL: 72 mg/dL (ref >40)
LDL CHOLESTEROL (CALC): 77 mg/dL
NON-HDL CHOLESTEROL (CALC): 104 mg/dL (ref <130)
Triglycerides: 162 mg/dL — ABNORMAL HIGH (ref <150)

## 2017-10-06 LAB — MICROALBUMIN / CREATININE URINE RATIO
Creatinine, Urine: 167 mg/dL (ref 20–320)
MICROALB UR: 0.8 mg/dL
MICROALB/CREAT RATIO: 5 ug/mg{creat} (ref <30)

## 2017-10-09 ENCOUNTER — Encounter: Payer: Self-pay | Admitting: Osteopathic Medicine

## 2017-10-09 ENCOUNTER — Ambulatory Visit (INDEPENDENT_AMBULATORY_CARE_PROVIDER_SITE_OTHER): Payer: Medicare Other | Admitting: Osteopathic Medicine

## 2017-10-09 VITALS — BP 150/88 | HR 70 | Temp 97.9°F | Resp 16 | Ht 70.0 in | Wt 179.5 lb

## 2017-10-09 DIAGNOSIS — Z0000 Encounter for general adult medical examination without abnormal findings: Secondary | ICD-10-CM

## 2017-10-09 DIAGNOSIS — Z87891 Personal history of nicotine dependence: Secondary | ICD-10-CM

## 2017-10-09 DIAGNOSIS — R030 Elevated blood-pressure reading, without diagnosis of hypertension: Secondary | ICD-10-CM

## 2017-10-09 DIAGNOSIS — D126 Benign neoplasm of colon, unspecified: Secondary | ICD-10-CM | POA: Diagnosis not present

## 2017-10-09 DIAGNOSIS — E785 Hyperlipidemia, unspecified: Secondary | ICD-10-CM | POA: Diagnosis not present

## 2017-10-09 DIAGNOSIS — E119 Type 2 diabetes mellitus without complications: Secondary | ICD-10-CM

## 2017-10-09 DIAGNOSIS — Z7189 Other specified counseling: Secondary | ICD-10-CM

## 2017-10-09 MED ORDER — PRIMIDONE 50 MG PO TABS: 50.0000 mg | ORAL_TABLET | Freq: Three times a day (TID) | ORAL | 0 refills | Status: DC

## 2017-10-09 NOTE — Patient Instructions (Signed)
Plan:  Look over Advance Directive information/forms and let me know if any questions   We will get records of recent shingles shot  Consider bone density test for osteoporosis   Will get referral for discussion of lung cancer screening given your smoking history   Plan to see [place] again in 6 months to keep an ey eon blood pressure and sugars     Preventive Care 65 Years and Older, Male Preventive care refers to lifestyle choices and visits with your health care provider that can promote health and wellness. WHAT DOES PREVENTIVE CARE INCLUDE?  A yearly physical exam. This is also called an annual well check.  Dental exams 1-2 times a year.  Routine eye exams. Ask your health care provider how often you should have your eyes checked.  Personal lifestyle choices including: ? Daily care of your teeth and gums. ? Regular physical activity. ? Eating a healthy diet. ? Avoiding tobacco and drug use. ? Limiting alcohol use. ? Practicing safe sex. ? Taking aspirin every day. ? Taking a vitamin D supplement.  WHAT HAPPENS DURING AN ANNUAL WELL CHECK? The services and screenings done by your health care provider during your annual well check will depend upon your age, overall health, lifestyle risk factors, and family history of disease. Counseling Your health care provider may ask you questions about your:  Alcohol use.  Tobacco use.  Drug use.  Emotional well-being.  Home and relationship well-being.  Sexual activity.  Eating habits.  History of falls.  Memory and ability to understand (cognition).  Work and work Statistician. Screening You may have the following tests or measurements:  Height, weight, and body mass index (BMI).  Blood pressure.  Lipid and cholesterol levels. This may happen every 5 years or more frequently if you are over 28 years old.  Skin check.  Lung cancer screening. This may happen every year starting at age 44 if you have a  30-pack-year history of smoking and currently smoke or have quit within the past 15 years.  Fecal occult blood test (FOBT) of the stool. You may have this test every year starting at age 53.  Flexible sigmoidoscopy or colonoscopy. You may have a sigmoidoscopy every 5 years or colonoscopy every10 years starting at age 70.  Prostate cancer screening. Recommendations will vary depending on your family history and other risks.  Hepatitis C blood test.  Hepatitis B blood test.  Sexually transmitted disease (STD) testing.  Diabetes screening. This is done by checking your blood sugar (glucose) after you have not eaten for a while (fasting). You may have this done every 1-3 years.  Abdominal aortic aneurysm (AAA) screening. You may need this if you are a current or former smoker.  Osteoporosis. You may be screened starting at age 77 if you are at high risk. Discuss your test results, treatment options, and, if necessary, the need for more tests with your health care provider. Vaccines Your health care provider may recommend certain vaccines, such as:  Influenza vaccine. This is recommended every year.  Tetanus, diphtheria, and acellular pertussis (Tdap, Td) vaccine. You may need a Td booster every 10 years.  Varicella vaccine. You may need this if you have not been vaccinated.  Zoster vaccine. You may need this after age 60.  Measles, mumps, rubella (MMR) vaccine. You may need at least one dose of MMR if you were born in 1957 or later. You may also need a second dose.  Pneumococcal 13-valent conjugate (PCV13) vaccine. One  dose is recommended after age 86.  Pneumococcal polysaccharide (PPSV23) vaccine. One dose is recommended after age 9.  Meningococcal vaccine. You may need this if you have certain conditions.  Hepatitis A vaccine. You may need this if you have certain conditions or travel or work in places where you may be exposed to hepatitis A.  Hepatitis B vaccine. You may need  this if you have certain conditions or travel or work in places where you may be exposed to hepatitis B.  Haemophilus influenzae type b (Hib) vaccine. You may need this if you have certain risk factors. Talk to your health care provider about which screenings and vaccines you need and how often you need them. Esta informacin no tiene Marine scientist el consejo del mdico. Asegrese de hacerle al mdico cualquier pregunta que tenga. Document Released: 12/03/2015 Document Revised: 12/03/2015 Document Reviewed: 09/07/2015 Elsevier Interactive Patient Education  2017 [employer].

## 2017-10-09 NOTE — Progress Notes (Signed)
HPI: Patrick Perkins is a 71 y.o. male  who presents to [place] today, 10/09/17,  for Medicare Annual Wellness Exam  Patient presents for annual physical/Medicare wellness exam. NO complaints today.  Other medical issues addressed today   DM2: A1C in prediabetic range :) no meds - diet controlled, No recent eye exam on file.   HLD: LDL just above 70, HDL looks good  BP: a bit higher on intake.   Hx colon polyps: following with GI    Past medical, surgical, social and family history reviewed:  Patient Active Problem List   Diagnosis Date Noted  . Cervical radiculitis 07/09/2014  . Ganglion cyst 03/23/2014  . Encounter for monitoring statin therapy 02/24/2013  . Benign essential tremor 08/31/2011  . Hyperlipemia 08/31/2011  . Type 2 diabetes mellitus ([place]) 01/07/2010  . COLONIC POLYPS 01/01/2009  . DIVERTICULOSIS OF COLON 01/01/2009  . Former smoker 12/14/2008    Past Surgical History:  Procedure Laterality Date  . HERNIA REPAIR  2005   double    Social History   Socioeconomic History  . Marital status: Married    Spouse name: Not on file  . Number of children: Not on file  . Years of education: Not on file  . Highest education level: Not on file  Social Needs  . Financial resource strain: Not on file  . Food insecurity - worry: Not on file  . Food insecurity - inability: Not on file  . Transportation needs - medical: Not on file  . Transportation needs - non-medical: Not on file  Occupational History  . Not on file  Tobacco Use  . Smoking status: Former Smoker    Last attempt to quit: 09/30/2009    Years since quitting: 8.0  . Smokeless tobacco: Never Used  Substance and Sexual Activity  . Alcohol use: Not on file  . Drug use: Not on file  . Sexual activity: Not on file  Other Topics Concern  . Not on file  Social History Narrative  . Not on file    Family History  Problem Relation Age of Onset  . Cancer  Father 19       colon     Current medication list and allergy/intolerance information reviewed:    Outpatient Encounter Medications as of 10/09/2017  Medication Sig  . AMBULATORY NON FORMULARY MEDICATION One Touch Verio glucose monitor.  Check blood sugar daily. Dx type 2 diabetes. E11.9  . AMBULATORY NON FORMULARY MEDICATION One Touch Verio glucose monitor test strips and lancets.  Check blood sugar daily. Dx type 2 diabetes. E11.9  . aspirin 81 MG tablet Take 81 mg by mouth daily.    Marland Kitchen FLUZONE HIGH-DOSE 0.5 ML injection Inject 0.5 mLs See admin instructions into the muscle.  Marland Kitchen glucose blood (ACCU-CHEK AVIVA) test strip Test blood sugar twice daily. Dx: E11.9  . lovastatin (MEVACOR) 20 MG tablet Take 1 tablet (20 mg total) by mouth at bedtime.  . Multiple Vitamins-Minerals (MULTIVITAMIN PO) Take by mouth.  . primidone (MYSOLINE) 50 MG tablet Take 1 tablet (50 mg total) by mouth daily. (Patient taking differently: Take 50 mg by mouth 3 (three) times daily. )   No facility-administered encounter medications on file as of 10/09/2017.     No Known Allergies     Review of Systems: Review of Systems - Negative 12 point ROS except occasional dribbling urine   Medicare Wellness Questionnaire  Are there smokers in your home (other than you)?  no  Depression Screen (Note: if answer to either of the following is "Yes", a more complete depression screening is indicated)   Q1: Over the past two weeks, have you felt down, depressed or hopeless? no  Q2: Over the past two weeks, have you felt little interest or pleasure in doing things? no  Have you lost interest or pleasure in daily life? no  Do you often feel hopeless? no  Do you cry easily over simple problems? no  Activities of Daily Living In your present state of health, do you have any difficulty performing the following activities?:  Driving? no Managing money?  no Feeding yourself? no Getting from bed to chair? no Climbing a  flight of stairs? no Preparing food and eating?: no Bathing or showering? no Getting dressed: no Getting to the toilet? no Using the toilet: no Moving around from place to place: no In the past year have you fallen or had a near fall?: no  Hearing Difficulties:  Do you often ask people to speak up or repeat themselves? no Do you experience ringing or noises in your ears? no  Do you have difficulty understanding soft or whispered voices? yes  Memory Difficulties:  Do you feel that you have a problem with memory? no  Do you often misplace items? no  Do you feel safe at home?  yes  Sexual Health:   Are you sexually active?  Yes  Do you have more than one partner?  No  Advanced Directives:   Advanced directives discussed: has NO advanced directive  - add't info requested. Referral to SW: not applicable  Additional information provided: yes  Risk Factors  Current exercise habits: walking, golf, yard work  Dietary issues discussed:no concerns  Cardiac risk factors: Diabetes Mellitus, hypertension, hypercholesterolemia/hyperlipidemia   Exam:  BP (!) 143/77 (BP Location: Left Arm, Patient Position: Sitting, Cuff Size: Large)   Pulse 66   Temp 97.9 F (36.6 C) (Oral)   Resp 16   Ht 5\' 10"  (1.778 m)   Wt 179 lb 8 oz (81.4 kg)   SpO2 99%   BMI 25.76 kg/m  Vision by Snellen chart: right eye:see nurse notes, left eye:see nurse notes  Constitutional: VS see above. General Appearance: alert, well-developed, well-nourished, NAD  Ears, Nose, Mouth, Throat: MMM  Neck: No masses, trachea midline.   Respiratory: Normal respiratory effort. no wheeze, no rhonchi, no rales  Cardiovascular:No lower extremity edema.   Musculoskeletal: Gait normal. No clubbing/cyanosis of digits.   Neurological: Normal balance/coordination. No tremor. Recalls 3 objects and able to read face of watch with correct time.   Skin: warm, dry, intact. No rash/ulcer.   Psychiatric: Normal  judgment/insight. Normal mood and affect. Oriented x3.     ASSESSMENT/PLAN:   Encounter for Medicare annual wellness exam  Medicare annual wellness visit, subsequent  Hyperlipidemia, unspecified hyperlipidemia type  Type 2 diabetes mellitus without complication, without long-term current use of insulin ([place])  Benign neoplasm of colon, unspecified part of colon  Advance directive discussed with patient  Former smoker - Plan: Ambulatory Referral for Lung Cancer Scre  Elevated blood pressure reading - f/u 2 weeks nurse visit - if >140/90 will need to add meds, if 130/80 or close, will monitor again in 6 mos     CANCER SCREENING  Lung - USPSTF: 55-80yo w/ 30 py hx unless quit w/in 73yr - needs = quit 2011, 1 ppd x "too damn many" probably 30+ years   Colon - does not need  Prostate - does not need - over 27 yo OTHER DISEASE SCREENING  Lipid - does not need  DM2 - does not need  AAA - male 52-75yo ever smoked - does not need  Osteoporosis - women 70yo+, men 70yo+ - needs - declined for now  INFECTIOUS DISEASE SCREENING  HIV - does not need  GC/CT - does not need  HepC -does not need  TB - does not need ADULT VACCINATION  Influenza - annual vaccine recommended  Td - booster every 10 years - does not need  Zoster - option at 75, yes at 61+ - needs second shingrix  PCV13 - does not need  PPSV23 - does not need Immunization History  Administered Date(s) Administered  . H1N1 12/14/2008  . Influenza Split 09/04/2011, 09/03/2012  . Influenza Whole 09/06/2009, 09/20/2010, 08/20/2013  . Influenza-Unspecified 08/20/2014, 07/22/2015, 09/03/2016  . Pneumococcal Conjugate-13 04/23/2015  . Pneumococcal Polysaccharide-23 09/20/2010, 04/03/2017  . Td 12/14/2008  . Zoster 02/29/2012  . Zoster Recombinat (Shingrix) 04/07/2017   OTHER  Fall - exercise and Vit D age 65+ - does not need  Advanced Directives -  Discussed as above  Patient Care Team: Emeterio Reeve, DO as PCP - General (Osteopathic Medicine) Orbie Hurst, MD as Referring Physician (Dermatology) Lawrence Marseilles, MD as Referring Physician (Neurology)  During the course of the visit the patient was educated and counseled about appropriate screening and preventive services as noted above.   Patient Instructions (the written plan) was given to the patient.  Medicare Attestation I have personally reviewed: The patient's medical and social history Their use of alcohol, tobacco or illicit drugs Their current medications and supplements The patient's functional ability including ADLs,fall risks, home safety risks, cognitive, and hearing and visual impairment Diet and physical activities Evidence for depression or mood disorders  The patient's weight, height, BMI, and visual acuity have been recorded in the chart.  I have made referrals, counseling, and provided education to the patient based on review of the above and I have provided the patient with a written personalized care plan for preventive services.     Emeterio Reeve, DO   10/09/17   Visit summary with medication list and pertinent instructions was printed for patient to review. All questions at time of visit were answered - patient instructed to contact office with any additional concerns. ER/RTC precautions were reviewed with the patient. Follow-up plan: Return in about 6 months (around 04/08/2018) for recheck BP and A1C, sooner if needed. 2 weeks nurse visit recheck high BP, see A/P above.

## 2017-10-17 ENCOUNTER — Other Ambulatory Visit: Payer: Self-pay | Admitting: Acute Care

## 2017-10-17 DIAGNOSIS — Z87891 Personal history of nicotine dependence: Secondary | ICD-10-CM

## 2017-10-17 DIAGNOSIS — Z122 Encounter for screening for malignant neoplasm of respiratory organs: Secondary | ICD-10-CM

## 2017-10-23 ENCOUNTER — Ambulatory Visit (INDEPENDENT_AMBULATORY_CARE_PROVIDER_SITE_OTHER): Payer: Medicare Other | Admitting: Osteopathic Medicine

## 2017-10-23 ENCOUNTER — Other Ambulatory Visit: Payer: Self-pay

## 2017-10-23 VITALS — BP 128/63 | HR 67

## 2017-10-23 DIAGNOSIS — R030 Elevated blood-pressure reading, without diagnosis of hypertension: Secondary | ICD-10-CM

## 2017-10-23 NOTE — Progress Notes (Signed)
BP 128/63   Pulse 67   BP ok on recheck, no need for medications at this point  Plan to follow up in office in 6 mos for prediabetes and blood pressure

## 2017-10-23 NOTE — Progress Notes (Signed)
Pt advised,verbalized understanding. 

## 2017-10-23 NOTE — Progress Notes (Signed)
Pt came into clinic today for BP check. At last OV his BP was elevated. Denies any chest pain, dizziness, blurry vision, or headaches. On first check today, Pt's BP was elevated. After letting him sit, it came down. Will route to PCP for review, Pt advised I would contact him with any changes.

## 2017-10-31 ENCOUNTER — Ambulatory Visit (INDEPENDENT_AMBULATORY_CARE_PROVIDER_SITE_OTHER)
Admission: RE | Admit: 2017-10-31 | Discharge: 2017-10-31 | Disposition: A | Discharge status: Home / Self Care | Payer: Medicare Other | Source: Ambulatory Visit | Attending: Acute Care | Admitting: Acute Care

## 2017-10-31 ENCOUNTER — Encounter: Payer: Self-pay | Admitting: Acute Care

## 2017-10-31 ENCOUNTER — Ambulatory Visit (INDEPENDENT_AMBULATORY_CARE_PROVIDER_SITE_OTHER): Payer: Medicare Other | Admitting: Acute Care

## 2017-10-31 DIAGNOSIS — Z87891 Personal history of nicotine dependence: Secondary | ICD-10-CM

## 2017-10-31 DIAGNOSIS — Z122 Encounter for screening for malignant neoplasm of respiratory organs: Secondary | ICD-10-CM

## 2017-10-31 NOTE — Progress Notes (Signed)
Shared Decision Making Visit Lung Cancer Screening Program [phone])   Eligibility:  Age 71 y.o.  Pack Years Smoking History Calculation 43 pack year smoking history (# packs/per year x # years smoked)  Recent History of coughing up blood  no  Unexplained weight loss? no ( >Than 15 pounds within the last 6 months )  Prior History Lung / other cancer no (Diagnosis within the last 5 years already requiring surveillance chest CT Scans).  Smoking Status Former Smoker  Former Smokers: Years since quit: 8 years  Quit Date: 2010  Visit Components:  Discussion included one or more decision making aids. yes  Discussion included risk/benefits of screening. yes  Discussion included potential follow up diagnostic testing for abnormal scans. yes  Discussion included meaning and risk of over diagnosis. yes  Discussion included meaning and risk of False Positives. yes  Discussion included meaning of total radiation exposure. yes  Counseling Included:  Importance of adherence to annual lung cancer LDCT screening. yes  Impact of comorbidities on ability to participate in the program. yes  Ability and willingness to under diagnostic treatment. yes  Smoking Cessation Counseling:  Current Smokers:   Discussed importance of smoking cessation. NA, former smoker  Information about tobacco cessation classes and interventions provided to patient. yes  Patient provided with "ticket" for LDCT Scan. yes  Symptomatic Patient. no  Counseling  Diagnosis Code: Tobacco Use Z72.0  Asymptomatic Patient yes  Counseling (Intermediate counseling: > three minutes counseling) R4270  Former Smokers:   Discussed the importance of maintaining cigarette abstinence. yes  Diagnosis Code: Personal History of Nicotine Dependence. W23.762  Information about tobacco cessation classes and interventions provided to patient. Yes  Patient provided with "ticket" for LDCT Scan. yes  Written Order for  Lung Cancer Screening with LDCT placed in Epic. Yes (CT Chest Lung Cancer Screening Low Dose W/O CM) [id number] Z12.2-Screening of respiratory organs Z87.891-Personal history of nicotine dependence  I spent 25 minutes of face to face time with Mr. Karlen discussing the risks and benefits of lung cancer screening. We viewed a power point together that explained in detail the above noted topics. We took the time to pause the power point at intervals to allow for questions to be asked and answered to ensure understanding. We discussed that he had taken the single most powerful action possible to decrease his risk of developing lung cancer when he quit smoking. I counseled him to remain smoke free, and to contact me if he ever had the desire to smoke again so that I can provide resources and tools to help support the effort to remain smoke free. We discussed the time and location of the scan, and that either  Doroteo Glassman RN or I will call with the results within  24-48 hours of receiving them. He has my card and contact information in the event he needs to speak with me, in addition to a copy of the power point we reviewed as a resource. Mr. Fife  verbalized understanding of all of the above and had no further questions upon leaving the office.     I explained to the patient that there has been a high incidence of coronary artery disease noted on these exams. I explained that this is a non-gated exam therefore degree or severity cannot be determined. This patient is currently on statin therapy. I have asked the patient to follow-up with their PCP regarding any incidental finding of coronary artery disease and management with diet or medication as  they feel is clinically indicated. The patient verbalized understanding of the above and had no further questions.      Magdalen Spatz, NP 10/31/2017

## 2017-11-02 ENCOUNTER — Other Ambulatory Visit: Payer: Self-pay | Admitting: Acute Care

## 2017-11-02 DIAGNOSIS — Z87891 Personal history of nicotine dependence: Secondary | ICD-10-CM

## 2017-11-02 DIAGNOSIS — Z122 Encounter for screening for malignant neoplasm of respiratory organs: Secondary | ICD-10-CM

## 2017-12-04 LAB — HM DIABETES EYE EXAM

## 2017-12-06 ENCOUNTER — Encounter: Payer: Self-pay | Admitting: Osteopathic Medicine

## 2018-01-05 ENCOUNTER — Other Ambulatory Visit: Payer: Self-pay | Admitting: Osteopathic Medicine

## 2018-04-09 ENCOUNTER — Ambulatory Visit: Payer: Medicare Other | Admitting: Osteopathic Medicine

## 2018-04-23 ENCOUNTER — Ambulatory Visit: Payer: Medicare Other | Admitting: Osteopathic Medicine

## 2018-04-26 ENCOUNTER — Encounter: Payer: Self-pay | Admitting: Osteopathic Medicine

## 2018-04-26 ENCOUNTER — Ambulatory Visit (INDEPENDENT_AMBULATORY_CARE_PROVIDER_SITE_OTHER): Payer: Medicare Other

## 2018-04-26 ENCOUNTER — Ambulatory Visit (INDEPENDENT_AMBULATORY_CARE_PROVIDER_SITE_OTHER): Payer: Medicare Other | Admitting: Osteopathic Medicine

## 2018-04-26 VITALS — BP 132/68 | HR 72 | Temp 98.3°F | Wt 180.5 lb

## 2018-04-26 DIAGNOSIS — E785 Hyperlipidemia, unspecified: Secondary | ICD-10-CM | POA: Diagnosis not present

## 2018-04-26 DIAGNOSIS — E119 Type 2 diabetes mellitus without complications: Secondary | ICD-10-CM

## 2018-04-26 DIAGNOSIS — M4802 Spinal stenosis, cervical region: Secondary | ICD-10-CM | POA: Insufficient documentation

## 2018-04-26 DIAGNOSIS — M5001 Cervical disc disorder with myelopathy,  high cervical region: Secondary | ICD-10-CM | POA: Diagnosis not present

## 2018-04-26 DIAGNOSIS — M9981 Other biomechanical lesions of cervical region: Secondary | ICD-10-CM

## 2018-04-26 DIAGNOSIS — M25552 Pain in left hip: Secondary | ICD-10-CM

## 2018-04-26 DIAGNOSIS — M542 Cervicalgia: Secondary | ICD-10-CM | POA: Diagnosis not present

## 2018-04-26 DIAGNOSIS — M5011 Cervical disc disorder with radiculopathy,  high cervical region: Secondary | ICD-10-CM

## 2018-04-26 DIAGNOSIS — M5030 Other cervical disc degeneration, unspecified cervical region: Secondary | ICD-10-CM

## 2018-04-26 LAB — POCT GLYCOSYLATED HEMOGLOBIN (HGB A1C): Hemoglobin A1C: 6.6 % — AB (ref 4.0–5.6)

## 2018-04-26 NOTE — Progress Notes (Signed)
HPI: Patrick Perkins is a 72 y.o. male who  has a past medical history of Diabetes mellitus and ED (erectile dysfunction).  he presents to [facility] today, 04/26/18,  for chief complaint of:  BP and A1C recheck   Sugars have stayed in pre-DM range for awhile now, today up to 6.6 though.  Patient admits that he has not been very compliant with good diet habits lately.  Is finding it difficult to exercise due to a few musculoskeletal complaints.  History of neck pain, seems to be a bit worse lately, occasional shooting pains into the shoulders, no numbness or weakness in the arms, no decreased grip strength or noticing that he is dropping any items.  No recent injury.  No headache or vision change.  Reports worsening pain as well in the left hip.  No weakness or fall.  No injury to this area.    Past medical history, surgical history, and family history reviewed.  Current medication list and allergy/intolerance information reviewed.   (See remainder of HPI, ROS, Phys Exam below)  Dg Cervical Spine 2 Or 3 Views  Result Date: 04/26/2018 CLINICAL DATA:  Years of posterior neck pain radiating to the back of both shoulders EXAM: CERVICAL SPINE - 2-3 VIEW COMPARISON:  None FINDINGS: Prevertebral soft tissues normal thickness. Reversal of cervical lordosis question muscle spasm. Osseous mineralization normal. Disc space narrowing and endplate spur formation at C3-C4 through C6-C7. Vertebral body heights maintained without fracture or subluxation. No bone destruction. Scattered facet degenerative changes at multiple levels. Odontoid process slightly approximates the RIGHT lateral mass of C1 on the open-mouth view though the head is minimally turned. Lung apices clear. IMPRESSION: Degenerative disc and facet disease changes of the cervical spine as above without fracture or dislocation. Electronically Signed   By: Lavonia Dana M.D.   On: 04/26/2018 17:04   Dg Hip  Unilat W Or W/o Pelvis 2-3 Views Left  Result Date: 04/26/2018 CLINICAL DATA:  Left hip pain for 6 weeks. EXAM: DG HIP (WITH OR WITHOUT PELVIS) 2-3V LEFT COMPARISON:  None. FINDINGS: There is no evidence of hip fracture or dislocation. There is no evidence of arthropathy or other focal bone abnormality. IMPRESSION: Negative. Electronically Signed   By: Lorriane Shire M.D.   On: 04/26/2018 17:02    MRI Cervical Spine Wo Contrast 06/29/2014 [facility] Result Narrative  .Marland KitchenMarland Kitchen"FINDINGS: There is a kyphosis of the cervical spine due to cervical spondylosis. The bone marrow signal is normal. The craniocervical junction is normal. The cervical spinal cord is normal. C2-C3: The disc is normal. There is facet arthritis on the left. There is  no foraminal stenosis. C3-C4: There is degenerative disc disease. There is bilateral  uncovertebral arthritis, worse on the right. There is severe right and  moderate to severe left neuroforaminal stenosis. C4-C5: There is severe degenerative disc disease with a broad-based disc  bulge. There is bilateral uncovertebral arthritis. There is moderate to  severe bilateral neuroforaminal stenosis. There is mild spinal stenosis  without cord deformity. C5-C6: There is severe degenerative disc disease. There is bilateral  uncovertebral arthritis with moderate to severe bilateral neuroforaminal  stenosis. There is mild spinal stenosis without cord deformity. C6-C7: There is severe degenerative disc disease with bilateral  uncovertebral arthritis. There is mild bilateral neuroforaminal stenosis. C7-T1: Normal  IMPRESSION:  Degenerative disc disease and moderate to severe bilateral neuroforaminal  stenosis at C3-4, C4-5 and C5-6. Degenerative disc disease and mild bilateral neuroforaminal stenosis at  C6-7."    Results for orders placed or performed in visit on 04/26/18 (from the past 72 hour(s))  POCT HgB A1C     Status: Abnormal   Collection Time: 04/26/18  11:41 AM  Result Value Ref Range   Hemoglobin A1C 6.6 (A) 4.0 - 5.6 %   HbA1c, POC (prediabetic range)  5.7 - 6.4 %   HbA1c, POC (controlled diabetic range)  0.0 - 7.0 %     ASSESSMENT/PLAN:   Type 2 diabetes mellitus without complication, without long-term current use of insulin (HCC) - Advise metformin, patient would like to hold off for now - Plan: POCT HgB A1C  Hyperlipidemia, unspecified hyperlipidemia type  Left hip pain - Suspect ITB, tendinitis, or can enteric bursitis.  Follow-up with sports medicine as needed.  X-ray today no concerns - Plan: DG HIP UNILAT W OR W/O PELVIS 2-3 VIEWS LEFT, Ambulatory referral to Physical Therapy  Neck pain - Plan: DG Cervical Spine 2 or 3 views, Ambulatory referral to Physical Therapy  Degenerative disc disease, cervical  Neuroforaminal stenosis of cervical spine  Other cervical disc degeneration, unspecified cervical region - We will go ahead and see if we can get MRI covered, patient is reluctant to try injections but will see what we see on the MRI - Plan: MR Cervical Spine Wo Contrast     Patient Instructions  If hip is not better or if it gets worse, I would recommend follow-up with one of our sports medicine specialists (Dr Georgina Snell or Dr. Patton Salles Dr. Darene Lamer) for further evaluation in 2-4 weeks. Just call our office and ask for an appointment for sports medicine!     Follow-up plan: Return in about 3 months (around 07/27/2018) for recheck A1C.     ############################################ ############################################ ############################################ ############################################    Outpatient Encounter Medications as of 04/26/2018  Medication Sig  . AMBULATORY NON FORMULARY MEDICATION One Touch Verio glucose monitor.  Check blood sugar daily. Dx type 2 diabetes. E11.9  . AMBULATORY NON FORMULARY MEDICATION One Touch Verio glucose monitor test strips and lancets.  Check blood sugar  daily. Dx type 2 diabetes. E11.9  . aspirin 81 MG tablet Take 81 mg by mouth daily.    Marland Kitchen glucose blood (ACCU-CHEK AVIVA) test strip Test blood sugar twice daily. Dx: E11.9  . lovastatin (MEVACOR) 20 MG tablet TAKE ONE TABLET BY MOUTH AT BEDTIME  . Multiple Vitamins-Minerals (MULTIVITAMIN PO) Take by mouth.  . primidone (MYSOLINE) 50 MG tablet Take 1 tablet (50 mg total) 3 (three) times daily by mouth.   No facility-administered encounter medications on file as of 04/26/2018.    No Known Allergies    Review of Systems:  Constitutional: No recent illness  HEENT: No  headache, no vision change  Cardiac: No  chest pain, No  pressure  Respiratory:  No  shortness of breath. No  Cough  Gastrointestinal: No  abdominal pain  Musculoskeletal: +new myalgia/arthralgia  Skin: No  Rash  Neurologic: No  weakness, No  Dizziness   Exam:  BP 132/68 (BP Location: Left Arm, Patient Position: Sitting, Cuff Size: Normal)   Pulse 72   Temp 98.3 F (36.8 C) (Oral)   Wt 180 lb 8 oz (81.9 kg)   BMI 25.90 kg/m   Constitutional: VS see above. General Appearance: alert, well-developed, well-nourished, NAD  Eyes: Normal lids and conjunctive, non-icteric sclera  Ears, Nose, Mouth, Throat: MMM, Normal external inspection ears/nares/mouth/lips/gums.  Neck: No masses, trachea midline.   Respiratory: Normal respiratory effort. no  wheeze, no rhonchi, no rales  Cardiovascular: S1/S2 normal, no murmur, no rub/gallop auscultated. RRR.   Musculoskeletal: Gait normal. Symmetric and independent movement of all extremities  Active straight leg raise bilaterally, negative Faber test on left hip, some tenderness over greater trochanter  No midline tenderness to cervical spine, normal range of motion rotation, negative Spurling's bilaterally  Neurological: Normal balance/coordination. No tremor.  Skin: warm, dry, intact.   Psychiatric: Normal judgment/insight. Normal mood and affect. Oriented x3.    Visit summary with medication list and pertinent instructions was printed for patient to review, advised to alert [place] if any changes needed. All questions at time of visit were answered - patient instructed to contact office with any additional concerns. ER/RTC precautions were reviewed with the patient and understanding verbalized.   Follow-up plan: Return in about 3 months (around 07/27/2018) for recheck A1C.  Note: Total time spent 25 minutes, greater than 50% of the visit was spent face-to-face counseling and coordinating care for the following: The primary encounter diagnosis was Type 2 diabetes mellitus without complication, without long-term current use of insulin ([place]). Diagnoses of Hyperlipidemia, unspecified hyperlipidemia type, Left hip pain, Neck pain, Degenerative disc disease, cervical, Neuroforaminal stenosis of cervical spine, and Other cervical disc degeneration, unspecified cervical region were also pertinent to this visit.Marland Kitchen  Please note: voice recognition software was used to produce this document, and typos may escape review. Please contact Dr. Sheppard Coil for any needed clarifications.

## 2018-04-26 NOTE — Patient Instructions (Signed)
If hip is not better or if it gets worse, I would recommend follow-up with one of our sports medicine specialists (Dr Georgina Snell or Dr. Patton Salles Dr. Darene Lamer) for further evaluation in 2-4 weeks. Just call our office and ask for an appointment for sports medicine!

## 2018-05-06 ENCOUNTER — Ambulatory Visit (INDEPENDENT_AMBULATORY_CARE_PROVIDER_SITE_OTHER): Payer: Medicare Other

## 2018-05-06 DIAGNOSIS — M5031 Other cervical disc degeneration,  high cervical region: Secondary | ICD-10-CM | POA: Diagnosis not present

## 2018-05-06 DIAGNOSIS — M4802 Spinal stenosis, cervical region: Secondary | ICD-10-CM | POA: Diagnosis not present

## 2018-05-07 ENCOUNTER — Encounter: Payer: Self-pay | Admitting: Physical Therapy

## 2018-05-07 ENCOUNTER — Ambulatory Visit (INDEPENDENT_AMBULATORY_CARE_PROVIDER_SITE_OTHER): Payer: Medicare Other | Admitting: Physical Therapy

## 2018-05-07 ENCOUNTER — Other Ambulatory Visit: Payer: Self-pay

## 2018-05-07 DIAGNOSIS — R29898 Other symptoms and signs involving the musculoskeletal system: Secondary | ICD-10-CM | POA: Diagnosis not present

## 2018-05-07 DIAGNOSIS — M542 Cervicalgia: Secondary | ICD-10-CM | POA: Diagnosis not present

## 2018-05-07 DIAGNOSIS — M6281 Muscle weakness (generalized): Secondary | ICD-10-CM

## 2018-05-07 DIAGNOSIS — M25552 Pain in left hip: Secondary | ICD-10-CM | POA: Diagnosis not present

## 2018-05-07 DIAGNOSIS — E119 Type 2 diabetes mellitus without complications: Secondary | ICD-10-CM

## 2018-05-07 MED ORDER — GLUCOSE BLOOD VI STRP: ORAL_STRIP | 12 refills | Status: DC
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
## 2018-05-10 ENCOUNTER — Encounter: Payer: Self-pay | Admitting: Rehabilitative and Restorative Service Providers"

## 2018-05-10 ENCOUNTER — Ambulatory Visit (INDEPENDENT_AMBULATORY_CARE_PROVIDER_SITE_OTHER): Payer: Medicare Other | Admitting: Rehabilitative and Restorative Service Providers"

## 2018-05-10 DIAGNOSIS — R29898 Other symptoms and signs involving the musculoskeletal system: Secondary | ICD-10-CM

## 2018-05-10 DIAGNOSIS — M25552 Pain in left hip: Secondary | ICD-10-CM | POA: Diagnosis not present

## 2018-05-10 DIAGNOSIS — M542 Cervicalgia: Secondary | ICD-10-CM | POA: Diagnosis not present

## 2018-05-10 DIAGNOSIS — M6281 Muscle weakness (generalized): Secondary | ICD-10-CM | POA: Diagnosis not present

## 2018-05-10 NOTE — Therapy (Signed)
[place], [place], [address] Phone: [phone]   Fax:  [phone]  Physical Therapy Treatment  Patient Details  Name: Patrick Perkins MRN: [id number] Date of Birth: [birth year] Referring Provider: Dr Emeterio Reeve   Encounter Date: 05/10/2018  PT End of Session - 05/10/18 1401    Visit Number  2    Number of Visits  8    Date for PT Re-Evaluation  06/04/18    PT Start Time  2500    PT Stop Time  1459    PT Time Calculation (min)  60 min    Activity Tolerance  Patient tolerated treatment well       Past Medical History:  Diagnosis Date  . Diabetes mellitus   . ED (erectile dysfunction)     Past Surgical History:  Procedure Laterality Date  . HERNIA REPAIR  2005   double    There were no vitals filed for this visit.  Subjective Assessment - 05/10/18 1401    Subjective  Patient reports that his hp is feeling a good bit better but his neck "not so much". He has done his exercises. Still has stiffness and tightness in his neck with some tingling into his shoulders more in the Lt than the Rt shoulder.     Currently in Pain?  Yes    Pain Score  4     Pain Location  Neck    Pain Orientation  Left;Right    Pain Descriptors / Indicators  Tightness;Tingling    Pain Type  Chronic pain    Pain Onset  More than a month ago    Pain Frequency  Constant                       [facility] Adult PT Treatment/Exercise - 05/10/18 0001      Neck Exercises: Machines for Strengthening   UBE (Upper Arm Bike)  L2 x 2 min (1 min fwd/1 min back)       Neck Exercises: Standing   Other Standing Exercises  3 way doorway stretch 30 sec x 2 reps each position     Other Standing Exercises  axial extension 10 sec x 5; scap squeeze 10 sec x 5; L's x 10; W's x 10       Neck Exercises: Supine   Neck Retraction  5 reps;10 secs      Moist Heat Therapy   Number Minutes Moist Heat  20 Minutes    Moist Heat Location  Cervical thoracic       Electrical Stimulation   Electrical Stimulation Location  bilat cervical     Electrical Stimulation Action  IFC    Electrical Stimulation Parameters  to tolerance    Electrical Stimulation Goals  Tone;Pain      Manual Therapy   Manual therapy comments  pt supine     Joint Mobilization  cervical CPA and lateral mobs     Soft tissue mobilization  deep tissue work through the ant/lat/posterior cerivcal musculature; upper traps; leveator    Myofascial Release  anterior chest    Passive ROM  passive cervical stretch into flexion; fexion with slight rotation; scaleni x 3 - stretching bilat cervical musculature              PT Education - 05/10/18 1414    Education Details  HEP   (Pended)     Person(s) Educated  Patient  (Pended)  Methods  Explanation;Demonstration;Tactile cues;Verbal cues;Handout  (Pended)     Comprehension  Verbalized understanding;Returned demonstration;Verbal cues required;Tactile cues required  (Pended)           PT Long Term Goals - 05/07/18 0950      PT LONG TERM GOAL #1   Title  I with advanced HEP (06/04/18)     Time  4    Period  Weeks    Status  New      PT LONG TERM GOAL #2   Title  report =/> 75% improvements in feelings of stiffness in his neck ( 06/04/18)     Time  4    Period  Weeks    Status  New      PT LONG TERM GOAL #3   Title  report =/> 75% reduction of Rt hip pain with walking ( 06/04/18)     Time  4    Period  Weeks    Status  New      PT LONG TERM GOAL #4   Title  maintain or improve FOTO 30% limited (06/04/18)     Time  4    Period  Weeks    Status  New      PT LONG TERM GOAL #5   Title  improve bilat cervical rotation =/> 75 degrees ( 06/04/18)     Time  6    Period  Weeks    Status  New            Plan - 05/10/18 1446    Clinical Impression Statement  Working on [employer]. Added exercise today to focus on posture and alignment. Good response to deep tissue work and  passive stretch.     Rehab Potential  Excellent    PT Frequency  2x / week    PT Duration  4 weeks    PT Treatment/Interventions  Iontophoresis 4mg /ml Dexamethasone;Dry needling;Manual techniques;Patient/family education;Ultrasound;Therapeutic exercise;Cryotherapy;Electrical Stimulation;Moist Heat;Passive range of motion    PT Next Visit Plan  PROM cervical in all directions, joint mobs Lt hip and cervical. STM to cervical and periocciput, possible DN    Consulted and Agree with Plan of Care  Patient       Patient will benefit from skilled therapeutic intervention in order to improve the following deficits and impairments:  Pain, Impaired UE functional use, Decreased strength, Decreased range of motion  Visit Diagnosis: Cervicalgia  Pain in left hip  Muscle weakness (generalized)  Other symptoms and signs involving the musculoskeletal system     Problem List Patient Active Problem List   Diagnosis Date Noted  . Neuroforaminal stenosis of cervical spine 04/26/2018  . Degenerative disc disease, cervical 04/26/2018  . Left hip pain 04/26/2018  . Cervical radiculitis 07/09/2014  . Ganglion cyst 03/23/2014  . Encounter for monitoring statin therapy 02/24/2013  . Benign essential tremor 08/31/2011  . Hyperlipemia 08/31/2011  . Type 2 diabetes mellitus ([place]) 01/07/2010  . COLONIC POLYPS 01/01/2009  . DIVERTICULOSIS OF COLON 01/01/2009  . Former smoker 12/14/2008    Browning PT, MPH  05/10/2018, 2:49 PM  [facility] [place], [place], [address] Phone: [phone]   Fax:  [phone]  Name: Patrick Perkins MRN: [id number] Date of Birth: 1946-03-02

## 2018-05-14 ENCOUNTER — Ambulatory Visit (INDEPENDENT_AMBULATORY_CARE_PROVIDER_SITE_OTHER): Payer: Medicare Other | Admitting: Rehabilitative and Restorative Service Providers"

## 2018-05-14 DIAGNOSIS — R29898 Other symptoms and signs involving the musculoskeletal system: Secondary | ICD-10-CM

## 2018-05-14 DIAGNOSIS — M6281 Muscle weakness (generalized): Secondary | ICD-10-CM

## 2018-05-14 DIAGNOSIS — M25552 Pain in left hip: Secondary | ICD-10-CM | POA: Diagnosis not present

## 2018-05-14 DIAGNOSIS — M542 Cervicalgia: Secondary | ICD-10-CM

## 2018-05-14 NOTE — Patient Instructions (Addendum)
Resisted External Rotation: in Neutral - Bilateral   PALMS UP Sit or stand, tubing in both hands, elbows at sides, bent to 90, forearms forward. Pinch shoulder blades together and rotate forearms out. Keep elbows at sides. Repeat __10__ times per set. Do _2-3___ sets per session. Do _2-3___ sessions per day.   Low Row: Standing   Face anchor, feet shoulder width apart. Palms up, pull arms back, squeezing shoulder blades together. Repeat 10__ times per set. Do 2-3__ sets per session. Do 1_ sessions per week. Anchor Height: Waist     Strengthening: Resisted Extension   Hold tubing in right hand, arm forward. Pull arm back, elbow straight. Repeat _10___ times per set. Do 2-3____ sets per session. Do 1___ sessions per day.  Trigger Point Dry Needling  . What is Trigger Point Dry Needling (DN)? o DN is a physical therapy technique used to treat muscle pain and dysfunction. Specifically, DN helps deactivate muscle trigger points (muscle knots).  o A thin filiform needle is used to penetrate the skin and stimulate the underlying trigger point. The goal is for a local twitch response (LTR) to occur and for the trigger point to relax. No medication of any kind is injected during the procedure.   . What Does Trigger Point Dry Needling Feel Like?  o The procedure feels different for each individual patient. Some patients report that they do not actually feel the needle enter the skin and overall the process is not painful. Very mild bleeding may occur. However, many patients feel a deep cramping in the muscle in which the needle was inserted. This is the local twitch response.   Marland Kitchen How Will I feel after the treatment? o Soreness is normal, and the onset of soreness may not occur for a few hours. Typically this soreness does not last longer than two days.  o Bruising is uncommon, however; ice can be used to decrease any possible bruising.  o In rare cases feeling tired or nauseous after the  treatment is normal. In addition, your symptoms may get worse before they get better, this period will typically not last longer than 24 hours.   . What Can I do After My Treatment? o Increase your hydration by drinking more water for the next 24 hours. o You may place ice or heat on the areas treated that have become sore, however, do not use heat on inflamed or bruised areas. Heat often brings more relief post needling. o You can continue your regular activities, but vigorous activity is not recommended initially after the treatment for 24 hours. o DN is best combined with other physical therapy such as strengthening, stretching, and other therapies.

## 2018-05-14 NOTE — Therapy (Signed)
[place], [place], [address] Phone: [phone]   Fax:  [phone]  Physical Therapy Treatment  Patient Details  Name: Patrick Perkins MRN: [id number] Date of Birth: August 26, 1946 Referring Provider: Dr Emeterio Reeve   Encounter Date: 05/14/2018  PT End of Session - 05/14/18 0939    Visit Number  3    Number of Visits  8    Date for PT Re-Evaluation  06/04/18    PT Start Time  0845    PT Stop Time  0944    PT Time Calculation (min)  59 min    Activity Tolerance  Patient tolerated treatment well       Past Medical History:  Diagnosis Date  . Diabetes mellitus   . ED (erectile dysfunction)     Past Surgical History:  Procedure Laterality Date  . HERNIA REPAIR  2005   double    There were no vitals filed for this visit.  Subjective Assessment - 05/14/18 0931    Subjective  Sore through his chest the day after his last visit. Soreness resolved iin a day. Neck felt looser but did tighten up again some. Not sure he is interested in the DN.     Currently in Pain?  No/denies    Pain Location  Neck    Pain Descriptors / Indicators  Tightness    Pain Type  Chronic pain    Pain Onset  More than a month ago    Pain Frequency  Constant                       [facility] Adult PT Treatment/Exercise - 05/14/18 0001      Neck Exercises: Theraband   Shoulder Extension  10 reps;Red focus on scap squeeze     Rows  10 reps;Red focus on scap squeeze     Shoulder External Rotation  10 reps;Red scapular retraction w/ swim noodle       Neck Exercises: Standing   Other Standing Exercises  3 way doorway stretch 30 sec x 2 reps each position     Other Standing Exercises  axial extension 10 sec x 5; scap squeeze 10 sec x 5; L's x 10; W's x 10       Neck Exercises: Supine   Neck Retraction  5 reps;10 secs      Moist Heat Therapy   Number Minutes Moist Heat  20 Minutes    Moist Heat  Location  Cervical thoracic       Electrical Stimulation   Electrical Stimulation Location  bilat cervical     Electrical Stimulation Action  IFC    Electrical Stimulation Parameters  to tolerance    Electrical Stimulation Goals  Tone;Pain      Manual Therapy   Manual therapy comments  pt supine     Joint Mobilization  cervical CPA and lateral mobs     Soft tissue mobilization  deep tissue work through the ant/lat/posterior cerivcal musculature; upper traps; leveator    Myofascial Release  anterior chest    Passive ROM  passive cervical stretch into flexion; fexion with slight rotation; scaleni x 3 - stretching bilat cervical musculature              PT Education - 05/14/18 0902    Education Details  HEP DN    Person(s) Educated  Patient    Methods  Explanation;Demonstration;Tactile cues;Verbal cues;Handout    Comprehension  Verbalized understanding;Returned demonstration;Verbal cues required;Tactile cues required          PT Long Term Goals - 05/14/18 0942      PT LONG TERM GOAL #1   Title  I with advanced HEP (06/04/18)     Time  4    Period  Weeks    Status  On-going      PT LONG TERM GOAL #2   Title  report =/> 75% improvements in feelings of stiffness in his neck ( 06/04/18)     Time  4    Period  Weeks    Status  On-going      PT LONG TERM GOAL #3   Title  report =/> 75% reduction of Rt hip pain with walking ( 06/04/18)     Time  4    Period  Weeks    Status  On-going      PT LONG TERM GOAL #4   Title  maintain or improve FOTO 30% limited (06/04/18)     Time  4    Period  Weeks    Status  On-going      PT LONG TERM GOAL #5   Title  improve bilat cervical rotation =/> 75 degrees ( 06/04/18)     Time  6    Period  Weeks    Status  On-going            Plan - 05/14/18 9628    Clinical Impression Statement  Some soreness through pecs from introducint pec stretch. Noted decreased stiffness and tightness through the neck area following treatment.  Continued with stretching, adding posterior shoulder girdle strengthening today. Significant muscular tightness through the ant/lat/post cervical musculature noted with deep tissue work.     Rehab Potential  Excellent    PT Frequency  2x / week    PT Duration  4 weeks    PT Treatment/Interventions  Iontophoresis 4mg /ml Dexamethasone;Dry needling;Manual techniques;Patient/family education;Ultrasound;Therapeutic exercise;Cryotherapy;Electrical Stimulation;Moist Heat;Passive range of motion    PT Next Visit Plan  PROM cervical in all directions, joint mobs Lt hip as needed(no c/o of hip pain currently); continue with cervical mobs. STM to cervical and periocciput, possible DN(provided DN info today)    Consulted and Agree with Plan of Care  Patient       Patient will benefit from skilled therapeutic intervention in order to improve the following deficits and impairments:  Pain, Impaired UE functional use, Decreased strength, Decreased range of motion  Visit Diagnosis: Cervicalgia  Pain in left hip  Muscle weakness (generalized)  Other symptoms and signs involving the musculoskeletal system     Problem List Patient Active Problem List   Diagnosis Date Noted  . Neuroforaminal stenosis of cervical spine 04/26/2018  . Degenerative disc disease, cervical 04/26/2018  . Left hip pain 04/26/2018  . Cervical radiculitis 07/09/2014  . Ganglion cyst 03/23/2014  . Encounter for monitoring statin therapy 02/24/2013  . Benign essential tremor 08/31/2011  . Hyperlipemia 08/31/2011  . Type 2 diabetes mellitus ([place]) 01/07/2010  . COLONIC POLYPS 01/01/2009  . DIVERTICULOSIS OF COLON 01/01/2009  . Former smoker 12/14/2008    [place] PT, MPH  05/14/2018, 9:44 AM  [facility] [place], [place], [address] Phone: [phone]   Fax:  [phone]  Name: Patrick Perkins MRN: [id number] Date of Birth: 1946/10/04

## 2018-05-17 ENCOUNTER — Encounter: Payer: Self-pay | Admitting: Rehabilitative and Restorative Service Providers"

## 2018-05-17 ENCOUNTER — Ambulatory Visit (INDEPENDENT_AMBULATORY_CARE_PROVIDER_SITE_OTHER): Payer: Medicare Other | Admitting: Rehabilitative and Restorative Service Providers"

## 2018-05-17 DIAGNOSIS — M25552 Pain in left hip: Secondary | ICD-10-CM

## 2018-05-17 DIAGNOSIS — M542 Cervicalgia: Secondary | ICD-10-CM | POA: Diagnosis not present

## 2018-05-17 DIAGNOSIS — R29898 Other symptoms and signs involving the musculoskeletal system: Secondary | ICD-10-CM

## 2018-05-17 DIAGNOSIS — M6281 Muscle weakness (generalized): Secondary | ICD-10-CM | POA: Diagnosis not present

## 2018-05-17 NOTE — Patient Instructions (Addendum)
Flexors, Sitting / Standing    Stand or sit, head in comfortable, centered position. Draw chin in, pulling head straight back, keeping jaw and eyes level. Hold __10-15_ seconds. Repeat __5_ times per session. Do _2-3__ sessions per day.   Side Bend, Sitting    Sit, head in comfortable, centered position, chin slightly tucked. Gently tilt head, bringing ear toward same-side shoulder. Hold _10-15__ seconds.  Repeat __5_ times per session. Do _2-3__ sessions per day.  Shoulder Shrugs 5-10  Shoulder rolls backwards 5-10

## 2018-05-17 NOTE — Therapy (Signed)
[place], [place], [address] Phone: [phone]   Fax:  [phone]  Physical Therapy Treatment  Patient Details  Name: Patrick Perkins MRN: [id number] Date of Birth: Jan 29, 1946 Referring Provider: Dr Emeterio Reeve   Encounter Date: 05/17/2018  PT End of Session - 05/17/18 0851    Visit Number  4    Number of Visits  8    Date for PT Re-Evaluation  06/04/18    PT Start Time  0109    PT Stop Time  0945    PT Time Calculation (min)  58 min    Activity Tolerance  Patient tolerated treatment well       Past Medical History:  Diagnosis Date  . Diabetes mellitus   . ED (erectile dysfunction)     Past Surgical History:  Procedure Laterality Date  . HERNIA REPAIR  2005   double    There were no vitals filed for this visit.  Subjective Assessment - 05/17/18 0856    Subjective  patient reports that he had a lot of soreness in the Rt pesc Tuesday night - did not feel any pain when stretching but felt pain every time he turned over Tuesday night. Better today and has no pain with pec stretch     Currently in Pain?  No/denies    Pain Location  Neck    Pain Orientation  Left;Right    Pain Descriptors / Indicators  Tightness    Pain Type  Chronic pain    Pain Onset  More than a month ago    Pain Frequency  Constant                       [facility] Adult PT Treatment/Exercise - 05/17/18 0001      Neck Exercises: Theraband   Shoulder Extension  10 reps;Red focus on scap squeeze     Rows  10 reps;Red focus on scap squeeze     Shoulder External Rotation  10 reps;Red scapular retraction w/ swim noodle       Neck Exercises: Standing   Other Standing Exercises  3 way doorway stretch 30 sec x 2 reps each position     Other Standing Exercises  axial extension 10 sec x 5; scap squeeze 10 sec x 5; L's x 10; W's x 10       Neck Exercises: Seated   Neck Retraction  5 reps;10 secs     Lateral Flexion  Right;Left;5 reps 10 sec     Shoulder Shrugs  5 reps    Shoulder Rolls  Backwards;Forwards;5 reps      Neck Exercises: Supine   Neck Retraction  5 reps;10 secs      Moist Heat Therapy   Number Minutes Moist Heat  20 Minutes    Moist Heat Location  Cervical thoracic       Electrical Stimulation   Electrical Stimulation Location  bilat cervical     Electrical Stimulation Action  IFC    Electrical Stimulation Parameters  to tolerance    Electrical Stimulation Goals  Tone;Pain      Manual Therapy   Manual therapy comments  pt supine     Joint Mobilization  cervical CPA and lateral mobs     Soft tissue mobilization  deep tissue work through the ant/lat/posterior cerivcal musculature; upper traps; leveator    Myofascial Release  anterior chest    Passive ROM  passive cervical stretch  into flexion; fexion with slight rotation; scaleni x 3 - stretching bilat cervical musculature              PT Education - 05/17/18 0957    Education Details  HEP     Person(s) Educated  Patient    Methods  Explanation;Demonstration;Tactile cues;Verbal cues;Handout    Comprehension  Verbalized understanding;Returned demonstration;Verbal cues required;Tactile cues required          PT Long Term Goals - 05/14/18 0942      PT LONG TERM GOAL #1   Title  I with advanced HEP (06/04/18)     Time  4    Period  Weeks    Status  On-going      PT LONG TERM GOAL #2   Title  report =/> 75% improvements in feelings of stiffness in his neck ( 06/04/18)     Time  4    Period  Weeks    Status  On-going      PT LONG TERM GOAL #3   Title  report =/> 75% reduction of Rt hip pain with walking ( 06/04/18)     Time  4    Period  Weeks    Status  On-going      PT LONG TERM GOAL #4   Title  maintain or improve FOTO 30% limited (06/04/18)     Time  4    Period  Weeks    Status  On-going      PT LONG TERM GOAL #5   Title  improve bilat cervical rotation =/> 75 degrees ( 06/04/18)      Time  6    Period  Weeks    Status  On-going            Plan - 05/17/18 6948    Clinical Impression Statement  Continued soreness and tightness in the Lt > Rt cervical spine. Patient does have an essential tremor - noted in neck and UE's which may influence the stiffness in the neck. Patient most likely over stretched pecs wtth doorway stretch. Continue work toward stated goals of therapy.     Rehab Potential  Excellent    PT Frequency  2x / week    PT Duration  4 weeks    PT Treatment/Interventions  Iontophoresis 4mg /ml Dexamethasone;Dry needling;Manual techniques;Patient/family education;Ultrasound;Therapeutic exercise;Cryotherapy;Electrical Stimulation;Moist Heat;Passive range of motion    PT Next Visit Plan  PROM cervical in all directions, joint mobs Lt hip as needed(no c/o of hip pain currently); continue with cervical mobs. STM to cervical and periocciput, possible DN(provided DN info today)    Consulted and Agree with Plan of Care  Patient       Patient will benefit from skilled therapeutic intervention in order to improve the following deficits and impairments:  Pain, Impaired UE functional use, Decreased strength, Decreased range of motion  Visit Diagnosis: Cervicalgia  Pain in left hip  Muscle weakness (generalized)  Other symptoms and signs involving the musculoskeletal system     Problem List Patient Active Problem List   Diagnosis Date Noted  . Neuroforaminal stenosis of cervical spine 04/26/2018  . Degenerative disc disease, cervical 04/26/2018  . Left hip pain 04/26/2018  . Cervical radiculitis 07/09/2014  . Ganglion cyst 03/23/2014  . Encounter for monitoring statin therapy 02/24/2013  . Benign essential tremor 08/31/2011  . Hyperlipemia 08/31/2011  . Type 2 diabetes mellitus ([place]) 01/07/2010  . COLONIC POLYPS 01/01/2009  . DIVERTICULOSIS OF COLON 01/01/2009  . Former smoker  12/14/2008    Jordie Schreur Nilda Simmer PT, MPH  05/17/2018, 10:04 AM  [facility] [place], [place], [address] Phone: [phone]   Fax:  [phone]  Name: STARK AGUINAGA MRN: [id number] Date of Birth: Jan 15, 1946

## 2018-05-21 ENCOUNTER — Ambulatory Visit (INDEPENDENT_AMBULATORY_CARE_PROVIDER_SITE_OTHER): Payer: Medicare Other | Admitting: Rehabilitative and Restorative Service Providers"

## 2018-05-21 ENCOUNTER — Encounter: Payer: Self-pay | Admitting: Rehabilitative and Restorative Service Providers"

## 2018-05-21 DIAGNOSIS — M25552 Pain in left hip: Secondary | ICD-10-CM

## 2018-05-21 DIAGNOSIS — R29898 Other symptoms and signs involving the musculoskeletal system: Secondary | ICD-10-CM | POA: Diagnosis not present

## 2018-05-21 DIAGNOSIS — M542 Cervicalgia: Secondary | ICD-10-CM

## 2018-05-21 DIAGNOSIS — M6281 Muscle weakness (generalized): Secondary | ICD-10-CM

## 2018-05-21 NOTE — Therapy (Signed)
Patrick Perkins [facility] [place], [place], [address] Phone: [phone]   Fax:  [phone]  Physical Therapy Treatment  Patient Details  Name: Patrick Perkins MRN: [id number] Date of Birth: [birth year] Referring Provider: Dr Sheppard Coil    Encounter Date: 05/21/2018  PT End of Session - 05/21/18 0901    Visit Number  5    Number of Visits  8    Date for PT Re-Evaluation  06/04/18    PT Start Time  0849    PT Stop Time  0942    PT Time Calculation (min)  53 min    Activity Tolerance  Patient tolerated treatment well       Past Medical History:  Diagnosis Date  . Diabetes mellitus   . ED (erectile dysfunction)     Past Surgical History:  Procedure Laterality Date  . HERNIA REPAIR  2005   double    There were no vitals filed for this visit.  Subjective Assessment - 05/21/18 0902    Subjective  [name] reports continued tightness in the neck musculature on Lt - He feels better after therapy but then tightens up again. He is doing his exercises at home twice a day. May consider DN at next visit.     Currently in Pain?  Yes    Pain Score  2     Pain Location  Neck    Pain Orientation  Left;Right    Pain Descriptors / Indicators  Tightness    Pain Type  Chronic pain    Pain Frequency  Constant         [facility] PT Assessment - 05/21/18 0001      Assessment   Medical Diagnosis  Neck pain and Lt hip pain    Referring Provider  Dr Sheppard Coil     Onset Date/Surgical Date  03/07/18    Hand Dominance  Right    Next MD Visit  3 months    Prior Therapy  yes 4-5 yrs ago      AROM   Cervical Flexion  55    Cervical Extension  43    Cervical - Right Rotation  63    Cervical - Left Rotation  54      Palpation   Spinal mobility  cervical hypomobile throughout - with CPA and lateral mobs     Palpation comment  tight in bilat cervical paraspinals and periocciput - no pain                   [facility] Adult PT  Treatment/Exercise - 05/21/18 0001      Neck Exercises: Machines for Strengthening   UBE (Upper Arm Bike)  L2 x 4 min (1 min fwd/1 min back)       Neck Exercises: Standing   Other Standing Exercises  3 way doorway stretch 30 sec x 2 reps each position     Other Standing Exercises  axial extension 10 sec x 5; scap squeeze 10 sec x 5; L's x 10; W's x 10       Neck Exercises: Seated   Neck Retraction  5 reps;10 secs    Lateral Flexion  Right;Left;5 reps 10 sec       Neck Exercises: Supine   Neck Retraction  5 reps;10 secs      Moist Heat Therapy   Number Minutes Moist Heat  20 Minutes    Moist Heat Location  Cervical thoracic  Hospital doctor  IFC    Electrical Stimulation Parameters  to tolerance    Electrical Stimulation Goals  Tone;Pain      Manual Therapy   Manual therapy comments  pt supine     Joint Mobilization  cervical CPA and lateral mobs     Soft tissue mobilization  deep tissue work through the ant/lat/posterior cerivcal musculature; upper traps; leveator    Myofascial Release  anterior chest    Passive ROM  passive cervical stretch into flexion; fexion with slight rotation; scaleni x 3 - stretching bilat cervical musculature              PT Education - 05/21/18 0933    Education Details  HEP    Person(s) Educated  Patient    Methods  Explanation;Demonstration;Tactile cues;Verbal cues;Handout    Comprehension  Verbalized understanding;Returned demonstration;Verbal cues required;Tactile cues required          PT Long Term Goals - 05/21/18 0915      PT LONG TERM GOAL #1   Title  I with advanced HEP (06/04/18)     Time  4    Period  Weeks    Status  On-going      PT LONG TERM GOAL #2   Title  report =/> 75% improvements in feelings of stiffness in his neck ( 06/04/18)     Time  4    Period  Weeks    Status  On-going      PT LONG TERM GOAL #3   Title   report =/> 75% reduction of Rt hip pain with walking ( 06/04/18)     Time  4    Period  Weeks    Status  On-going      PT LONG TERM GOAL #4   Title  maintain or improve FOTO 30% limited (06/04/18)     Time  4    Period  Weeks    Status  On-going      PT LONG TERM GOAL #5   Title  improve bilat cervical rotation =/> 75 degrees ( 06/04/18)     Time  6    Period  Weeks    Status  On-going            Plan - 05/21/18 1104    Clinical Impression Statement  [name] continues to have tightness in Lt > Rt cervical spine - which is improved with stretching and manual work as well as modalities in the clinic but symptoms return in time. Patient does have essential tremor which may continbute to cervical stiffness. He sees his neurologist next week and will ask his opinion.     Rehab Potential  Excellent    PT Frequency  2x / week    PT Duration  4 weeks    PT Treatment/Interventions  Iontophoresis 4mg /ml Dexamethasone;Dry needling;Manual techniques;Patient/family education;Ultrasound;Therapeutic exercise;Cryotherapy;Electrical Stimulation;Moist Heat;Passive range of motion    PT Next Visit Plan  PROM cervical in all directions, joint mobs Lt hip as needed(no c/o of hip pain currently); continue with cervical mobs. STM to cervical and periocciput, possible DN - at next visit     Consulted and Agree with Plan of Care  Patient       Patient will benefit from skilled therapeutic intervention in order to improve the following deficits and impairments:  Pain, Impaired UE functional use, Decreased strength, Decreased range of motion  Visit  Diagnosis: Cervicalgia  Pain in left hip  Muscle weakness (generalized)  Other symptoms and signs involving the musculoskeletal system     Problem List Patient Active Problem List   Diagnosis Date Noted  . Neuroforaminal stenosis of cervical spine 04/26/2018  . Degenerative disc disease, cervical 04/26/2018  . Left hip pain 04/26/2018  . Cervical  radiculitis 07/09/2014  . Ganglion cyst 03/23/2014  . Encounter for monitoring statin therapy 02/24/2013  . Benign essential tremor 08/31/2011  . Hyperlipemia 08/31/2011  . Type 2 diabetes mellitus ([place]) 01/07/2010  . COLONIC POLYPS 01/01/2009  . DIVERTICULOSIS OF COLON 01/01/2009  . Former smoker 12/14/2008    Kenvil PT, MPH 05/21/2018, 11:07 AM  [facility] [place], [place], [address] Phone: [phone]   Fax:  [phone]  Name: Patrick Perkins MRN: [id number] Date of Birth: 09-May-1946

## 2018-05-21 NOTE — Patient Instructions (Addendum)
SUPINE Tips A   Can add noodle along spine  Being in the supine position means to be lying on the back. Lying on the back is the position of least compression on the bones and discs of the spine, and helps to re-align the natural curves of the back. Arms at side resting flat - work toward 3-5 min - can bend elbows for a few seconds to release stretch as needed

## 2018-05-24 ENCOUNTER — Encounter: Payer: Medicare Other | Admitting: Rehabilitative and Restorative Service Providers"

## 2018-05-28 ENCOUNTER — Encounter: Payer: Self-pay | Admitting: Rehabilitative and Restorative Service Providers"

## 2018-05-28 ENCOUNTER — Ambulatory Visit (INDEPENDENT_AMBULATORY_CARE_PROVIDER_SITE_OTHER): Payer: Medicare Other | Admitting: Rehabilitative and Restorative Service Providers"

## 2018-05-28 DIAGNOSIS — M25552 Pain in left hip: Secondary | ICD-10-CM | POA: Diagnosis not present

## 2018-05-28 DIAGNOSIS — R29898 Other symptoms and signs involving the musculoskeletal system: Secondary | ICD-10-CM

## 2018-05-28 DIAGNOSIS — M542 Cervicalgia: Secondary | ICD-10-CM

## 2018-05-28 DIAGNOSIS — M6281 Muscle weakness (generalized): Secondary | ICD-10-CM | POA: Diagnosis not present

## 2018-05-28 NOTE — Therapy (Signed)
[facility] [place], [place], [address] Phone: [phone]   Fax:  [phone]  Physical Therapy Treatment  Patient Details  Name: Patrick Perkins MRN: [id number] Date of Birth: 01-14-1946 Referring Provider: Dr Sheppard Coil   Encounter Date: 05/28/2018  PT End of Session - 05/28/18 0935    Visit Number  6    Number of Visits  8    Date for PT Re-Evaluation  06/04/18    PT Start Time  0933    PT Stop Time  1034    PT Time Calculation (min)  61 min    Activity Tolerance  Patient tolerated treatment well       Past Medical History:  Diagnosis Date  . Diabetes mellitus   . ED (erectile dysfunction)     Past Surgical History:  Procedure Laterality Date  . HERNIA REPAIR  2005   double    There were no vitals filed for this visit.  Subjective Assessment - 05/28/18 0936    Subjective  Patrick Perkins reports flare up of muscular tightness through the Lt neck Saturday 05/25/18 - he worked on his stretching and massaged the neck and it gradually loosened up. See neurologist this week and will ask about esential tremor and effect on muscular tightness.     Currently in Pain?  Yes    Pain Score  2     Pain Location  Neck    Pain Orientation  Left;Right    Pain Descriptors / Indicators  Tightness    Pain Type  Chronic pain         [facility] PT Assessment - 05/28/18 0001      Assessment   Medical Diagnosis  Neck pain and Lt hip pain    Referring Provider  Dr Sheppard Coil    Onset Date/Surgical Date  03/07/18    Hand Dominance  Right    Next MD Visit  3 months    Prior Therapy  yes 4-5 yrs ago      AROM   Cervical Flexion  63    Cervical Extension  50    Cervical - Right Rotation  72    Cervical - Left Rotation  59      Palpation   Palpation comment  tight in bilat cervical paraspinals and periocciput - no pain                   [facility] Adult PT Treatment/Exercise - 05/28/18 0001      Neck  Exercises: Machines for Strengthening   UBE (Upper Arm Bike)  L2 x 4 min (1 min fwd/1 min back)       Neck Exercises: Standing   Other Standing Exercises  3 way doorway stretch 30 sec x 2 reps each position       Neck Exercises: Supine   Neck Retraction  5 reps;10 secs      Moist Heat Therapy   Number Minutes Moist Heat  20 Minutes    Moist Heat Location  Cervical thoracic       Electrical Stimulation   Electrical Stimulation Location  bilat cervical     Electrical Stimulation Action  IFC    Electrical Stimulation Parameters  to tolerance    Electrical Stimulation Goals  Tone;Pain      Manual Therapy   Manual therapy comments  pt prone     Joint Mobilization  cervical CPA and lateral mobs     Soft tissue mobilization  deep tissue work through the ant/lat/posterior cerivcal musculature; upper traps; leveator    Myofascial Release  posterior cervical to thoracic area        Trigger Point Dry Needling - 05/28/18 1012    Consent Given?  Yes    Education Handout Provided  Yes    Muscles Treated Upper Body  Scalenes;Upper trapezius;Oblique capitus;Levator scapulae;Longissimus Lt with estim     Scalenes Response  Palpable increased muscle length    Oblique Capitus Response  Palpable increased muscle length    Longissimus Response  Palpable increased muscle length cervcial            PT Education - 05/28/18 0940    Education Details  DN    Person(s) Educated  Patient    Methods  Explanation    Comprehension  Verbalized understanding          PT Long Term Goals - 05/28/18 0254      PT LONG TERM GOAL #1   Title  I with advanced HEP (06/04/18)     Time  4    Period  Weeks    Status  On-going      PT LONG TERM GOAL #2   Title  report =/> 75% improvements in feelings of stiffness in his neck ( 06/04/18)     Time  4    Period  Weeks    Status  On-going      PT LONG TERM GOAL #3   Title  report =/> 75% reduction of Rt hip pain with walking ( 06/04/18)     Time  4     Period  Weeks    Status  On-going      PT LONG TERM GOAL #4   Title  maintain or improve FOTO 30% limited (06/04/18)     Time  4    Period  Weeks    Status  On-going      PT LONG TERM GOAL #5   Title  improve bilat cervical rotation =/> 75 degrees ( 06/04/18)     Time  6    Period  Weeks    Status  On-going            Plan - 05/28/18 0941    Clinical Impression Statement  Patrick Perkins continues to have tightness through the cervical musculature Lt > Rt. He does have Rt handed golf swing and the Lt UE does most of the driving forse with the swing as he looks to the Lt - and he does play "a lot of golf". Good response to DN and manual work. Will await report from neurologist re essential tremor and influence on chronic neck pain/tightness.     Rehab Potential  Excellent    PT Frequency  2x / week    PT Duration  4 weeks    PT Treatment/Interventions  Iontophoresis 4mg /ml Dexamethasone;Dry needling;Manual techniques;Patient/family education;Ultrasound;Therapeutic exercise;Cryotherapy;Electrical Stimulation;Moist Heat;Passive range of motion    PT Next Visit Plan  PROM cervical in all directions, joint mobs Lt hip as needed(no c/o of hip pain currently); continue with cervical mobs. STM to cervical and periocciput,assess response to DN    Consulted and Agree with Plan of Care  Patient       Patient will benefit from skilled therapeutic intervention in order to improve the following deficits and impairments:  Pain, Impaired UE functional use, Decreased strength, Decreased range of motion  Visit Diagnosis: Cervicalgia  Pain in left hip  Muscle weakness (generalized)  Other  symptoms and signs involving the musculoskeletal system     Problem List Patient Active Problem List   Diagnosis Date Noted  . Neuroforaminal stenosis of cervical spine 04/26/2018  . Degenerative disc disease, cervical 04/26/2018  . Left hip pain 04/26/2018  . Cervical radiculitis 07/09/2014  . Ganglion cyst  03/23/2014  . Encounter for monitoring statin therapy 02/24/2013  . Benign essential tremor 08/31/2011  . Hyperlipemia 08/31/2011  . Type 2 diabetes mellitus ([place]) 01/07/2010  . COLONIC POLYPS 01/01/2009  . DIVERTICULOSIS OF COLON 01/01/2009  . Former smoker 12/14/2008    Inga Noller Nilda Simmer PT, MPH  05/28/2018, 10:14 AM  [facility] [place], [place], [address] Phone: [phone]   Fax:  [phone]  Name: Patrick Perkins MRN: [id number] Date of Birth: [birth year]

## 2018-05-28 NOTE — Patient Instructions (Signed)

## 2018-05-31 ENCOUNTER — Ambulatory Visit (INDEPENDENT_AMBULATORY_CARE_PROVIDER_SITE_OTHER): Payer: Medicare Other | Admitting: Rehabilitative and Restorative Service Providers"

## 2018-05-31 ENCOUNTER — Encounter: Payer: Self-pay | Admitting: Rehabilitative and Restorative Service Providers"

## 2018-05-31 DIAGNOSIS — M542 Cervicalgia: Secondary | ICD-10-CM | POA: Diagnosis not present

## 2018-05-31 DIAGNOSIS — M25552 Pain in left hip: Secondary | ICD-10-CM | POA: Diagnosis not present

## 2018-05-31 DIAGNOSIS — R29898 Other symptoms and signs involving the musculoskeletal system: Secondary | ICD-10-CM | POA: Diagnosis not present

## 2018-05-31 DIAGNOSIS — M6281 Muscle weakness (generalized): Secondary | ICD-10-CM | POA: Diagnosis not present

## 2018-05-31 NOTE — Therapy (Signed)
[facility] [place], [place], [address] Phone: [phone]   Fax:  [phone]  Physical Therapy Treatment  Patient Details  Name: Patrick Perkins MRN: [id number] Date of Birth: [birth year] Referring Provider: Dr Sheppard Coil   Encounter Date: 05/31/2018  PT End of Session - 05/31/18 0938    Visit Number  7    Number of Visits  8    Date for PT Re-Evaluation  06/04/18    PT Start Time  0934    PT Stop Time  1030    PT Time Calculation (min)  56 min    Activity Tolerance  Patient tolerated treatment well       Past Medical History:  Diagnosis Date  . Diabetes mellitus   . ED (erectile dysfunction)     Past Surgical History:  Procedure Laterality Date  . HERNIA REPAIR  2005   double    There were no vitals filed for this visit.  Subjective Assessment - 05/31/18 0938    Subjective  [name] saw the neurologist that follows him for essential tremor this week MD felt the tremor of head/neck could directly contribute to the neck pain/stiffness. Briefly discussed the possibility of ?TP injections. Wants [name] to continue with PT. Patient reports that he had some soreness in the neck following DN but does feel it wsa helpful. He feels he is continuing to make progress with neck pain/stiffness.     Currently in Pain?  No/denies    Pain Location  Neck    Pain Orientation  Left;Right    Pain Descriptors / Indicators  Tightness    Pain Onset  More than a month ago    Pain Frequency  Constant                       [facility] Adult PT Treatment/Exercise - 05/31/18 0001      Neck Exercises: Machines for Strengthening   UBE (Upper Arm Bike)  L2 x 4 min (1 min fwd/1 min back)       Neck Exercises: Standing   Other Standing Exercises  3 way doorway stretch 30 sec x 2 reps each position       Neck Exercises: Supine   Neck Retraction  5 reps;10 secs      Moist Heat Therapy   Number Minutes Moist Heat   20 Minutes    Moist Heat Location  Cervical thoracic       Electrical Stimulation   Electrical Stimulation Location  bilat cervical     Electrical Stimulation Action  IFC    Electrical Stimulation Parameters  to tolerance    Electrical Stimulation Goals  Tone;Pain      Manual Therapy   Manual therapy comments  pt supine     Joint Mobilization  cervical CPA and lateral mobs     Soft tissue mobilization  deep tissue work through the ant/lat/posterior cerivcal musculature; upper traps; leveator    Myofascial Release  posterior cervical to thoracic area     Passive ROM  passive cervical stretch into flexion; fexion with slight rotation; scaleni x 3 - stretching bilat cervical musculature        Trigger Point Dry Needling - 05/31/18 1357    Consent Given?  Yes    Muscles Treated Upper Body  -- bilat     Scalenes Response  Palpable increased muscle length;Twitch reponse elicited    Oblique Capitus Response  Palpable increased muscle length;Twitch  response elicited    Longissimus Response  Palpable increased muscle length                PT Long Term Goals - 05/28/18 3329      PT LONG TERM GOAL #1   Title  I with advanced HEP (06/04/18)     Time  4    Period  Weeks    Status  On-going      PT LONG TERM GOAL #2   Title  report =/> 75% improvements in feelings of stiffness in his neck ( 06/04/18)     Time  4    Period  Weeks    Status  On-going      PT LONG TERM GOAL #3   Title  report =/> 75% reduction of Rt hip pain with walking ( 06/04/18)     Time  4    Period  Weeks    Status  On-going      PT LONG TERM GOAL #4   Title  maintain or improve FOTO 30% limited (06/04/18)     Time  4    Period  Weeks    Status  On-going      PT LONG TERM GOAL #5   Title  improve bilat cervical rotation =/> 75 degrees ( 06/04/18)     Time  6    Period  Weeks    Status  On-going            Plan - 05/31/18 0941    Clinical Impression Statement  Good response to the DN with  less discomfort and pain. He does have some continued stiffness. Will continue with stretching, postural strengthening; DN; manual work. Progressing gradually toward goals of therapy.     Rehab Potential  Good    PT Frequency  2x / week    PT Duration  4 weeks    PT Treatment/Interventions  Iontophoresis 4mg /ml Dexamethasone;Dry needling;Manual techniques;Patient/family education;Ultrasound;Therapeutic exercise;Cryotherapy;Electrical Stimulation;Moist Heat;Passive range of motion    PT Next Visit Plan  PROM cervical in all directions, joint mobs Lt hip as needed(no c/o of hip pain currently); continue with cervical mobs. STM to cervical and periocciput, continue DN    Consulted and Agree with Plan of Care  Patient       Patient will benefit from skilled therapeutic intervention in order to improve the following deficits and impairments:  Pain, Impaired UE functional use, Decreased strength, Decreased range of motion  Visit Diagnosis: Cervicalgia  Pain in left hip  Muscle weakness (generalized)  Other symptoms and signs involving the musculoskeletal system     Problem List Patient Active Problem List   Diagnosis Date Noted  . Neuroforaminal stenosis of cervical spine 04/26/2018  . Degenerative disc disease, cervical 04/26/2018  . Left hip pain 04/26/2018  . Cervical radiculitis 07/09/2014  . Ganglion cyst 03/23/2014  . Encounter for monitoring statin therapy 02/24/2013  . Benign essential tremor 08/31/2011  . Hyperlipemia 08/31/2011  . Type 2 diabetes mellitus ([place]) 01/07/2010  . COLONIC POLYPS 01/01/2009  . DIVERTICULOSIS OF COLON 01/01/2009  . Former smoker 12/14/2008    Devin Foskey Nilda Simmer PT, MPH  05/31/2018, 2:00 [place], [place], [address] Phone: [phone]   Fax:  [phone]  Name: JACEY PELC MRN: [id number] Date of Birth: 08-Aug-1946

## 2018-06-04 ENCOUNTER — Encounter: Payer: Medicare Other | Admitting: Rehabilitative and Restorative Service Providers"

## 2018-06-05 ENCOUNTER — Encounter: Payer: Self-pay | Admitting: Rehabilitative and Restorative Service Providers"

## 2018-06-05 ENCOUNTER — Ambulatory Visit (INDEPENDENT_AMBULATORY_CARE_PROVIDER_SITE_OTHER): Payer: Medicare Other | Admitting: Rehabilitative and Restorative Service Providers"

## 2018-06-05 DIAGNOSIS — R29898 Other symptoms and signs involving the musculoskeletal system: Secondary | ICD-10-CM

## 2018-06-05 DIAGNOSIS — M25552 Pain in left hip: Secondary | ICD-10-CM | POA: Diagnosis not present

## 2018-06-05 DIAGNOSIS — M542 Cervicalgia: Secondary | ICD-10-CM | POA: Diagnosis not present

## 2018-06-05 DIAGNOSIS — M6281 Muscle weakness (generalized): Secondary | ICD-10-CM

## 2018-06-05 NOTE — Therapy (Signed)
[facility] [place], [place], [address] Phone: [phone]   Fax:  [phone]  Physical Therapy Treatment  Patient Details  Name: Patrick Perkins MRN: [id number] Date of Birth: 1946/06/19 Referring Provider: Dr Sheppard Coil   Encounter Date: 06/05/2018  PT End of Session - 06/05/18 0937    Visit Number  8    Number of Visits  20    Date for PT Re-Evaluation  07/17/18    PT Start Time  0930    PT Stop Time  1020    PT Time Calculation (min)  50 min    Activity Tolerance  Patient tolerated treatment well       Past Medical History:  Diagnosis Date  . Diabetes mellitus   . ED (erectile dysfunction)     Past Surgical History:  Procedure Laterality Date  . HERNIA REPAIR  2005   double    There were no vitals filed for this visit.  Subjective Assessment - 06/05/18 1005    Subjective  [name] reports that he is improving. He has less pain and really feels pretty good today. Played golf yesterday. Still has some stiffness and tightness but is definitely better.     Currently in Pain?  Yes    Pain Score  1     Pain Location  Neck    Pain Orientation  Left;Right    Pain Descriptors / Indicators  Tightness    Pain Type  Chronic pain    Pain Onset  More than a month ago    Pain Frequency  Intermittent                       [facility] Adult PT Treatment/Exercise - 06/05/18 0001      Neck Exercises: Machines for Strengthening   UBE (Upper Arm Bike)  L2 x 4 min (1 min fwd/1 min back)       Neck Exercises: Standing   Other Standing Exercises  3 way doorway stretch 30 sec x 2 reps each position       Neck Exercises: Supine   Neck Retraction  5 reps;10 secs      Neck Exercises: Prone   Other Prone Exercise  prone axial extension with posterior shoulder girdle engaged arms at side x 10; W's; airplane; superman x 5 each       Moist Heat Therapy   Number Minutes Moist Heat  20 Minutes    Moist Heat Location  Cervical thoracic       Electrical Stimulation   Electrical Stimulation Location  bilat cervical     Electrical Stimulation Action  IFC    Electrical Stimulation Parameters  to tolerance    Electrical Stimulation Goals  Tone;Pain      Manual Therapy   Manual therapy comments  pt prone and supine     Joint Mobilization  cervical CPA and lateral mobs     Soft tissue mobilization  deep tissue work through the ant/lat/posterior cerivcal musculature; upper traps; leveator    Myofascial Release  posterior cervical to thoracic area     Passive ROM  passive cervical stretch into flexion; fexion with slight rotation; scaleni x 3 - stretching bilat cervical musculature        Trigger Point Dry Needling - 06/05/18 1009    Consent Given?  Yes    Muscles Treated Upper Body  -- 1 DN Lt posterior cervical mid cervical spine level  Longissimus Response  Palpable increased muscle length Lt mid cervical                 PT Long Term Goals - 06/05/18 1012      PT LONG TERM GOAL #1   Title  I with advanced HEP (07/17/18)     Time  10    Period  Weeks    Status  Revised      PT LONG TERM GOAL #2   Title  report =/> 75% improvements in feelings of stiffness in his neck ( 07/17/18)     Time  10    Period  Weeks    Status  Revised      PT LONG TERM GOAL #3   Title  report =/> 75% reduction of Rt hip pain with walking ( 06/04/18)     Time  4    Period  Weeks    Status  Achieved      PT LONG TERM GOAL #4   Title  maintain or improve FOTO 30% limited (07/17/18)     Time  10    Period  Weeks    Status  Revised      PT LONG TERM GOAL #5   Title  improve bilat cervical rotation =/> 75 degrees ( 07/17/18)     Time  10    Period  Weeks    Status  Revised            Plan - 06/05/18 1010    Clinical Impression Statement  Continued improvement reported and noted with patient stating that he has decresaed pain and discomfort; improved cervical mobility;  decreased muscular tightness to palpation. He is progressing well toward stated goals of therapy and will benefit from continued PT to accomplish maximum rehab potential.     Rehab Potential  Good    PT Frequency  2x / week    PT Duration  Other (comment) 10 weeks     PT Treatment/Interventions  Iontophoresis 4mg /ml Dexamethasone;Dry needling;Manual techniques;Patient/family education;Ultrasound;Therapeutic exercise;Cryotherapy;Electrical Stimulation;Moist Heat;Passive range of motion    PT Next Visit Plan  PROM cervical in all directions, joint mobs Lt hip as needed(no c/o of hip pain currently); continue with cervical mobs. STM to cervical and periocciput, continue DN as indicated     Consulted and Agree with Plan of Care  Patient       Patient will benefit from skilled therapeutic intervention in order to improve the following deficits and impairments:  Pain, Impaired UE functional use, Decreased strength, Decreased range of motion  Visit Diagnosis: Cervicalgia - Plan: PT plan of care cert/re-cert  Pain in left hip  Muscle weakness (generalized) - Plan: PT plan of care cert/re-cert  Other symptoms and signs involving the musculoskeletal system - Plan: PT plan of care cert/re-cert     Problem List Patient Active Problem List   Diagnosis Date Noted  . Neuroforaminal stenosis of cervical spine 04/26/2018  . Degenerative disc disease, cervical 04/26/2018  . Left hip pain 04/26/2018  . Cervical radiculitis 07/09/2014  . Ganglion cyst 03/23/2014  . Encounter for monitoring statin therapy 02/24/2013  . Benign essential tremor 08/31/2011  . Hyperlipemia 08/31/2011  . Type 2 diabetes mellitus ([place]) 01/07/2010  . COLONIC POLYPS 01/01/2009  . DIVERTICULOSIS OF COLON 01/01/2009  . Former smoker 12/14/2008    Laureles PT, MPH  06/05/2018, 10:16 AM  [facility] [place], [place], [address] Phone: [phone]  Fax:  [phone]  Name: TALAN GILDNER MRN: [id number] Date of Birth: 1946/07/29

## 2018-06-07 ENCOUNTER — Encounter: Payer: Self-pay | Admitting: Rehabilitative and Restorative Service Providers"

## 2018-06-07 ENCOUNTER — Ambulatory Visit (INDEPENDENT_AMBULATORY_CARE_PROVIDER_SITE_OTHER): Payer: Medicare Other | Admitting: Rehabilitative and Restorative Service Providers"

## 2018-06-07 DIAGNOSIS — M25552 Pain in left hip: Secondary | ICD-10-CM

## 2018-06-07 DIAGNOSIS — M542 Cervicalgia: Secondary | ICD-10-CM | POA: Diagnosis not present

## 2018-06-07 DIAGNOSIS — M6281 Muscle weakness (generalized): Secondary | ICD-10-CM

## 2018-06-07 DIAGNOSIS — R29898 Other symptoms and signs involving the musculoskeletal system: Secondary | ICD-10-CM

## 2018-06-07 NOTE — Patient Instructions (Signed)
Scaleni stretches x 3  30 sec hold  Patient provided illustration - travell book

## 2018-06-07 NOTE — Therapy (Signed)
[place], [place], [address] Phone: [phone]   Fax:  [phone]  Physical Therapy Treatment  Patient Details  Name: Patrick Perkins MRN: [id number] Date of Birth: 1945/12/25 Referring Provider: Dr Sheppard Coil   Encounter Date: 06/07/2018  PT End of Session - 06/07/18 1022    Visit Number  9    Number of Visits  20    Date for PT Re-Evaluation  07/17/18    PT Start Time  1019    PT Stop Time  1121    PT Time Calculation (min)  62 min    Activity Tolerance  Patient tolerated treatment well       Past Medical History:  Diagnosis Date  . Diabetes mellitus   . ED (erectile dysfunction)     Past Surgical History:  Procedure Laterality Date  . HERNIA REPAIR  2005   double    There were no vitals filed for this visit.  Subjective Assessment - 06/07/18 1022    Subjective  Neck is stiff. He awoke with increased stiffness this morning. May have slept wrong. Does not know of anything he has done differently. Played golf yesterday.     Currently in Pain?  Yes    Pain Score  3     Pain Location  Neck    Pain Orientation  Left    Pain Descriptors / Indicators  Tightness;Discomfort;Dull;Aching    Pain Type  Chronic pain    Pain Onset  More than a month ago    Pain Frequency  Intermittent                       [facility] Adult PT Treatment/Exercise - 06/07/18 0001      Neck Exercises: Machines for Strengthening   UBE (Upper Arm Bike)  L2 x 4 min (1 min fwd/1 min back)       Neck Exercises: Standing   Other Standing Exercises  3 way doorway stretch 30 sec x 2 reps each position       Neck Exercises: Supine   Other Supine Exercise  scalein stretches x 3 positions 30 sec hold PT assist x 2; pt assisting with opposite hand x 1 (travell stretch with chin tucked)      Moist Heat Therapy   Number Minutes Moist Heat  20 Minutes    Moist Heat Location  Cervical thoracic       Electrical Stimulation   Electrical Stimulation Location  bilat cervical     Electrical Stimulation Action  IFC    Electrical Stimulation Parameters  to tolerance    Electrical Stimulation Goals  Tone;Pain      Manual Therapy   Manual therapy comments  pt prone and supine     Joint Mobilization  cervical CPA and lateral mobs     Soft tissue mobilization  deep tissue work through the ant/lat/posterior cerivcal musculature; upper traps; leveator    Myofascial Release  posterior cervical to thoracic area     Passive ROM  passive cervical stretch for scaleni x 3 - stretching bilat cervical musculature        Trigger Point Dry Needling - 06/07/18 1255    Consent Given?  Yes    Muscles Treated Upper Body  -- Lt with estim     Scalenes Response  Palpable increased muscle length    Oblique Capitus Response  Palpable increased muscle length    Longissimus Response  Palpable  increased muscle length           PT Education - 06/07/18 1257    Education Details  HEP     Person(s) Educated  Patient    Methods  Explanation;Demonstration;Tactile cues;Verbal cues;Handout    Comprehension  Verbalized understanding;Returned demonstration;Verbal cues required;Tactile cues required          PT Long Term Goals - 06/05/18 1012      PT LONG TERM GOAL #1   Title  I with advanced HEP (07/17/18)     Time  10    Period  Weeks    Status  Revised      PT LONG TERM GOAL #2   Title  report =/> 75% improvements in feelings of stiffness in his neck ( 07/17/18)     Time  10    Period  Weeks    Status  Revised      PT LONG TERM GOAL #3   Title  report =/> 75% reduction of Rt hip pain with walking ( 06/04/18)     Time  4    Period  Weeks    Status  Achieved      PT LONG TERM GOAL #4   Title  maintain or improve FOTO 30% limited (07/17/18)     Time  10    Period  Weeks    Status  Revised      PT LONG TERM GOAL #5   Title  improve bilat cervical rotation =/> 75 degrees ( 07/17/18)     Time  10     Period  Weeks    Status  Revised            Plan - 06/07/18 1024    Clinical Impression Statement  Flare up of stiffness and tightness today. Not sure of cause(s) of increase in symptoms. Responds well to DN, manual work and stretching. Persistent tightness Lt cervical musculature. significant tightness noted through the Lt cervical musculature - especially scaleni. Good response with DN, manual work, passive stretching Lt cervical musculature.     Rehab Potential  Good    PT Frequency  2x / week    PT Duration  Other (comment)    PT Treatment/Interventions  Iontophoresis 4mg /ml Dexamethasone;Dry needling;Manual techniques;Patient/family education;Ultrasound;Therapeutic exercise;Cryotherapy;Electrical Stimulation;Moist Heat;Passive range of motion    PT Next Visit Plan  PROM cervical in all directions, joint mobs Lt hip as needed(no c/o of hip pain currently); continue with cervical mobs. STM to cervical and periocciput, continue DN as indicated     Consulted and Agree with Plan of Care  Patient       Patient will benefit from skilled therapeutic intervention in order to improve the following deficits and impairments:  Pain, Impaired UE functional use, Decreased strength, Decreased range of motion  Visit Diagnosis: Cervicalgia  Pain in left hip  Muscle weakness (generalized)  Other symptoms and signs involving the musculoskeletal system     Problem List Patient Active Problem List   Diagnosis Date Noted  . Neuroforaminal stenosis of cervical spine 04/26/2018  . Degenerative disc disease, cervical 04/26/2018  . Left hip pain 04/26/2018  . Cervical radiculitis 07/09/2014  . Ganglion cyst 03/23/2014  . Encounter for monitoring statin therapy 02/24/2013  . Benign essential tremor 08/31/2011  . Hyperlipemia 08/31/2011  . Type 2 diabetes mellitus ([place]) 01/07/2010  . COLONIC POLYPS 01/01/2009  . DIVERTICULOSIS OF COLON 01/01/2009  . Former smoker 12/14/2008    [place]  PT, MPH  06/07/2018, 1:00 PM  [facility] [place], [place], [address] Phone: [phone]   Fax:  [phone]  Name: Patrick Perkins MRN: [id number] Date of Birth: May 27, 1946

## 2018-06-12 ENCOUNTER — Ambulatory Visit (INDEPENDENT_AMBULATORY_CARE_PROVIDER_SITE_OTHER): Payer: Medicare Other | Admitting: Rehabilitative and Restorative Service Providers"

## 2018-06-12 ENCOUNTER — Encounter: Payer: Self-pay | Admitting: Rehabilitative and Restorative Service Providers"

## 2018-06-12 DIAGNOSIS — R29898 Other symptoms and signs involving the musculoskeletal system: Secondary | ICD-10-CM | POA: Diagnosis not present

## 2018-06-12 DIAGNOSIS — M542 Cervicalgia: Secondary | ICD-10-CM

## 2018-06-12 DIAGNOSIS — M6281 Muscle weakness (generalized): Secondary | ICD-10-CM

## 2018-06-12 NOTE — Patient Instructions (Signed)

## 2018-06-12 NOTE — Therapy (Signed)
[place], [place], [address] Phone: [phone]   Fax:  [phone]  Physical Therapy Treatment  Patient Details  Name: Patrick Perkins MRN: [id number] Date of Birth: [birth year] Referring Provider: Dr Sheppard Coil   Encounter Date: 06/12/2018  PT End of Session - 06/12/18 1402    Visit Number  10    Number of Visits  20    Date for PT Re-Evaluation  07/17/18    PT Start Time  1400    PT Stop Time  1452    PT Time Calculation (min)  52 min    Activity Tolerance  Patient tolerated treatment well       Past Medical History:  Diagnosis Date  . Diabetes mellitus   . ED (erectile dysfunction)     Past Surgical History:  Procedure Laterality Date  . HERNIA REPAIR  2005   double    There were no vitals filed for this visit.  Subjective Assessment - 06/12/18 1402    Subjective  No real pain  - still has some stiffness in the Lt side of his neck. He is doing his regular activities and continues to golf without pain. He is working on his exercises daily. He is using the home traction unit again and that seems to help.     Currently in Pain?  No/denies         [facility] PT Assessment - 06/12/18 0001      Assessment   Medical Diagnosis  Neck pain and Lt hip pain    Referring Provider  Dr Sheppard Coil    Onset Date/Surgical Date  03/07/18    Hand Dominance  Right    Next MD Visit  3 months    Prior Therapy  yes 4-5 yrs ago      Observation/Other Assessments   Focus on Therapeutic Outcomes (FOTO)   26% limitation       AROM   Cervical Flexion  62    Cervical Extension  51    Cervical - Right Rotation  74    Cervical - Left Rotation  65      Palpation   Palpation comment  improving tightness Lt > Rt cervical paraspinals and periocciput - no pain                   [facility] Adult PT Treatment/Exercise - 06/12/18 0001      Neck Exercises: Machines for Strengthening   UBE (Upper Arm  Bike)  L2 x 4 min (1 min fwd/1 min back)       Neck Exercises: Standing   Other Standing Exercises  3 way doorway stretch 30 sec x 2 reps each position       Neck Exercises: Seated   Neck Retraction  5 reps;10 secs      Neck Exercises: Supine   Neck Retraction  5 reps;10 secs    Cervical Rotation  Right;Left 2 reps each side with overpressure by pt 5 sec hold     Other Supine Exercise  scaleni stretches x 3 positions 30 sec hold PT assist x 2; pt assisting with opposite hand x 1 (travell stretch with chin tucked)      Moist Heat Therapy   Number Minutes Moist Heat  15 Minutes    Moist Heat Location  Cervical thoracic       Electrical Stimulation   Electrical Stimulation Location  bilat cervical     Electrical Stimulation Action  TENS    Electrical Stimulation Parameters  to tolerance    Electrical Stimulation Goals  Tone;Pain      Manual Therapy   Manual therapy comments  pt supine     Joint Mobilization  cervical CPA and lateral mobs     Soft tissue mobilization  deep tissue work through the ant/lat/posterior cerivcal musculature; upper traps; leveator    Myofascial Release  posterior cervical to thoracic area     Passive ROM  passive cervical stretch for scaleni x 3 - stretching bilat cervical musculature              PT Education - 06/12/18 1445    Education Details  TENs unit     Person(s) Educated  Patient    Methods  Explanation    Comprehension  Verbalized understanding          PT Long Term Goals - 06/12/18 1451      PT LONG TERM GOAL #1   Title  I with advanced HEP (07/17/18)     Time  10    Period  Weeks    Status  Partially Met      PT LONG TERM GOAL #2   Title  report =/> 75% improvements in feelings of stiffness in his neck ( 07/17/18)     Time  10    Period  Weeks    Status  Partially Met      PT LONG TERM GOAL #4   Title  maintain or improve FOTO 30% limited (07/17/18)     Time  10    Period  Weeks    Status  Achieved      PT LONG TERM  GOAL #5   Title  improve bilat cervical rotation =/> 75 degrees ( 07/17/18)     Time  10    Period  Weeks    Status  Partially Met            Plan - 06/12/18 1448    Clinical Impression Statement  good improvement since last visit. Patient has decreased muacular tightness to palpation and improved cervical rotation. He has decreased pain and improved functional abilities. FOTO goal is accomplished and patient is progressing well toward remainder of goals.     Rehab Potential  Good    PT Frequency  2x / week    PT Duration  Other (comment)    PT Treatment/Interventions  Iontophoresis 55m/ml Dexamethasone;Dry needling;Manual techniques;Patient/family education;Ultrasound;Therapeutic exercise;Cryotherapy;Electrical Stimulation;Moist Heat;Passive range of motion    PT Next Visit Plan  PROM cervical in all directions, joint mobs Lt hip as needed(no c/o of hip pain currently); continue with cervical mobs. STM to cervical and periocciput, continue DN as indicated continue treatment one visit next week then hold for 1-2 weeks (pt on vacation) will assess response and readiness for discharge     Consulted and Agree with Plan of Care  Patient       Patient will benefit from skilled therapeutic intervention in order to improve the following deficits and impairments:  Pain, Impaired UE functional use, Decreased strength, Decreased range of motion  Visit Diagnosis: Cervicalgia  Muscle weakness (generalized)  Other symptoms and signs involving the musculoskeletal system     Problem List Patient Active Problem List   Diagnosis Date Noted  . Neuroforaminal stenosis of cervical spine 04/26/2018  . Degenerative disc disease, cervical 04/26/2018  . Left hip pain 04/26/2018  . Cervical radiculitis 07/09/2014  . Ganglion cyst 03/23/2014  . Encounter  for monitoring statin therapy 02/24/2013  . Benign essential tremor 08/31/2011  . Hyperlipemia 08/31/2011  . Type 2 diabetes mellitus ([place])  01/07/2010  . COLONIC POLYPS 01/01/2009  . DIVERTICULOSIS OF COLON 01/01/2009  . Former smoker 12/14/2008    [place] PT, MPH  06/12/2018, 2:52 PM  [facility] [place], [place], [address] Phone: [phone]   Fax:  [phone]  Name: Patrick Perkins MRN: [id number] Date of Birth: [birth year]

## 2018-06-18 ENCOUNTER — Ambulatory Visit (INDEPENDENT_AMBULATORY_CARE_PROVIDER_SITE_OTHER): Payer: Medicare Other | Admitting: Rehabilitative and Restorative Service Providers"

## 2018-06-18 ENCOUNTER — Encounter: Payer: Self-pay | Admitting: Rehabilitative and Restorative Service Providers"

## 2018-06-18 DIAGNOSIS — M6281 Muscle weakness (generalized): Secondary | ICD-10-CM

## 2018-06-18 DIAGNOSIS — M25552 Pain in left hip: Secondary | ICD-10-CM

## 2018-06-18 DIAGNOSIS — R29898 Other symptoms and signs involving the musculoskeletal system: Secondary | ICD-10-CM | POA: Diagnosis not present

## 2018-06-18 DIAGNOSIS — M542 Cervicalgia: Secondary | ICD-10-CM | POA: Diagnosis not present

## 2018-06-18 NOTE — Therapy (Addendum)
[facility] [address] [place], [place], [address] Phone: [phone]   Fax:  [phone]  Physical Therapy Treatment  Patient Details  Name: Patrick Perkins MRN: [id number] Date of Birth: 11/04/1946 Referring Provider: Dr Alexander   Encounter Date: 06/18/2018  PT End of Session - 06/18/18 0933    Visit Number  11    Number of Visits  20    Date for PT Re-Evaluation  07/17/18    PT Start Time  0931    PT Stop Time  1018    PT Time Calculation (min)  47 min    Activity Tolerance  Patient tolerated treatment well       Past Medical History:  Diagnosis Date  . Diabetes mellitus   . ED (erectile dysfunction)     Past Surgical History:  Procedure Laterality Date  . HERNIA REPAIR  2005   double    There were no vitals filed for this visit.  Subjective Assessment - 06/18/18 0934    Subjective  No pain - just some stiffness on the Lt side of the neck. Played golf yesterday and did well. Has improved a lot with therapy and home program. Continued HEP and traction.    Currently in Pain?  No/denies         [facility] PT Assessment - 06/18/18 0001      Assessment   Medical Diagnosis  Neck pain and Lt hip pain    Referring Provider  Dr Alexander    Onset Date/Surgical Date  03/07/18    Hand Dominance  Right    Next MD Visit  3 months    Prior Therapy  yes 4-5 yrs ago      AROM   Cervical Extension  53    Cervical - Right Side Bend  41    Cervical - Left Side Bend  44    Cervical - Right Rotation  74    Cervical - Left Rotation  68      Palpation   Palpation comment  improving tightness Lt > Rt cervical paraspinals and periocciput - no pain                   [facility] Adult PT Treatment/Exercise - 06/18/18 0001      Neck Exercises: Machines for Strengthening   UBE (Upper Arm Bike)  L3 x 4 min (1 min fwd/1 min back)       Neck Exercises: Standing   Other Standing Exercises  3 way doorway  stretch 30 sec x 2 reps each position       Neck Exercises: Seated   Neck Retraction  5 reps;10 secs    Lateral Flexion  Right;Left;5 reps with chin tuck - last 2 reps with overpressure by patient       Neck Exercises: Supine   Other Supine Exercise  prolonged snow angel ~ 2 min       Moist Heat Therapy   Number Minutes Moist Heat  15 Minutes    Moist Heat Location  Cervical thoracic       Electrical Stimulation   Electrical Stimulation Location  Lt cervical     Electrical Stimulation Action  TENs    Electrical Stimulation Parameters  to tolerance    Electrical Stimulation Goals  Tone;Pain      Manual Therapy   Manual therapy comments  pt supine     Joint Mobilization  cervical CPA and lateral mobs       Soft tissue mobilization  deep tissue work through the ant/lat/posterior cerivcal musculature; upper traps; leveator    Myofascial Release  posterior cervical to thoracic area     Passive ROM  passive cervical stretch for scaleni x 3 - stretching bilat cervical musculature              PT Education - 06/18/18 1011    Education Details  reviewed home program and engouraged consistency with stretching program     Person(s) Educated  Patient    Methods  Explanation    Comprehension  Verbalized understanding          PT Long Term Goals - 06/18/18 0935      PT LONG TERM GOAL #1   Title  I with advanced HEP (07/17/18)     Time  10    Period  Weeks    Status  Achieved      PT LONG TERM GOAL #2   Title  report =/> 75% improvements in feelings of stiffness in his neck ( 07/17/18)     Time  10    Period  Weeks    Status  Achieved      PT LONG TERM GOAL #3   Title  report =/> 75% reduction of Rt hip pain with walking ( 06/04/18)     Time  4    Period  Weeks    Status  Achieved      PT LONG TERM GOAL #4   Title  maintain or improve FOTO 30% limited (07/17/18)     Time  10    Period  Weeks    Status  Achieved      PT LONG TERM GOAL #5   Title  improve bilat  cervical rotation =/> 75 degrees ( 07/17/18)     Time  10    Period  Weeks    Status  Partially Met            Plan - 06/18/18 1013    Clinical Impression Statement  Excellent progress with rehab. Patient reports resolution of cervical and hip pain. He continues to have some tightness or stiffness in the Lt cervical spine but this is greatly improved - at least 80% per pt report. There is significant improvement in muscular tightness to palpation through the Lt lateral/posterior cervical spine. Rom is increased. Patient has returned to full ADL's including golfing without problems.     Rehab Potential  Good    PT Frequency  2x / week    PT Duration  Other (comment)    PT Treatment/Interventions  Iontophoresis 4mg/ml Dexamethasone;Dry needling;Manual techniques;Patient/family education;Ultrasound;Therapeutic exercise;Cryotherapy;Electrical Stimulation;Moist Heat;Passive range of motion    PT Next Visit Plan  Patrick Perkins will continue with independent HEP. He is leaving for 10 day vacation tomorrow. Patient will call with any questions or problems. We will hold chart open for 2 weeks for patient to reschedule as needed following trip     Consulted and Agree with Plan of Care  Patient       Patient will benefit from skilled therapeutic intervention in order to improve the following deficits and impairments:  Pain, Impaired UE functional use, Decreased strength, Decreased range of motion  Visit Diagnosis: Cervicalgia  Muscle weakness (generalized)  Other symptoms and signs involving the musculoskeletal system  Pain in left hip     Problem List Patient Active Problem List   Diagnosis Date Noted  . Neuroforaminal stenosis of cervical spine 04/26/2018  . Degenerative disc   disease, cervical 04/26/2018  . Left hip pain 04/26/2018  . Cervical radiculitis 07/09/2014  . Ganglion cyst 03/23/2014  . Encounter for monitoring statin therapy 02/24/2013  . Benign essential tremor 08/31/2011  .  Hyperlipemia 08/31/2011  . Type 2 diabetes mellitus ([place]) 01/07/2010  . COLONIC POLYPS 01/01/2009  . DIVERTICULOSIS OF COLON 01/01/2009  . Former smoker 12/14/2008    [place] PT, MPH  06/18/2018, 10:17 AM  [facility] [place], [place], [address] Phone: [phone]   Fax:  [phone]  Name: SAGAR TENGAN MRN: [id number] Date of Birth: October 24, 1946  PHYSICAL THERAPY DISCHARGE SUMMARY  Visits from Start of Care: 11  Current functional level related to goals / functional outcomes: See last progress note for discharge status    Remaining deficits: Unknown    Education / Equipment: HEP and home cervical traction unit daily  Plan: Patient agrees to discharge.  Patient goals were met. Patient is being discharged due to meeting the stated rehab goals.  ?????     Elsbeth Yearick P. Helene Kelp PT, MPH 07/05/18 12:17 PM

## 2018-07-24 ENCOUNTER — Telehealth: Payer: Self-pay

## 2018-07-24 DIAGNOSIS — E119 Type 2 diabetes mellitus without complications: Secondary | ICD-10-CM

## 2018-07-24 DIAGNOSIS — Z0000 Encounter for general adult medical examination without abnormal findings: Secondary | ICD-10-CM

## 2018-07-24 NOTE — Telephone Encounter (Signed)
Orders are in

## 2018-07-24 NOTE — Telephone Encounter (Signed)
Pt has an appt next week & is requesting lab order for annual check up. Thanks.

## 2018-07-24 NOTE — Telephone Encounter (Signed)
Left a vm msg for pt regarding provider's note. Direct call back information provided.

## 2018-07-30 ENCOUNTER — Encounter: Payer: Self-pay | Admitting: Osteopathic Medicine

## 2018-07-30 ENCOUNTER — Ambulatory Visit (INDEPENDENT_AMBULATORY_CARE_PROVIDER_SITE_OTHER): Payer: Medicare Other | Admitting: Osteopathic Medicine

## 2018-07-30 VITALS — BP 130/67 | HR 63 | Temp 98.1°F | Wt 179.1 lb

## 2018-07-30 DIAGNOSIS — R7303 Prediabetes: Secondary | ICD-10-CM | POA: Diagnosis not present

## 2018-07-30 DIAGNOSIS — R0982 Postnasal drip: Secondary | ICD-10-CM | POA: Diagnosis not present

## 2018-07-30 DIAGNOSIS — J029 Acute pharyngitis, unspecified: Secondary | ICD-10-CM

## 2018-07-30 LAB — POCT GLYCOSYLATED HEMOGLOBIN (HGB A1C): HEMOGLOBIN A1C: 6.2 % — AB (ref 4.0–5.6)

## 2018-07-30 MED ORDER — AMOXICILLIN-POT CLAVULANATE 875-125 MG PO TABS: 1.0000 | ORAL_TABLET | Freq: Two times a day (BID) | ORAL | 0 refills | Status: DC

## 2018-07-30 MED ORDER — FLUTICASONE PROPIONATE 50 MCG/ACT NA SUSP: 2.0000 | Freq: Every day | NASAL | 6 refills | Status: DC

## 2018-07-30 NOTE — Patient Instructions (Signed)
Call or message me in the next week or so with update on sore throat - if better, nothing to do, if still bothering you, will send referral to ENT specialist.

## 2018-07-30 NOTE — Progress Notes (Signed)
HPI: Patrick Perkins is a 72 y.o. male who  has a past medical history of Diabetes mellitus and ED (erectile dysfunction).  he presents to [facility] today, 07/30/18,  for chief complaint of:  Sugar follow-up  A1C last visit was up to 6.6 diabetic range, today improved to 6.2!  Patient reports ongoing sore throat for about 3 weeks.  Deviously seen by urgent care, was told viral, treated symptomatically, has not seemed to make much difference.  History of smoking but quit about 11 years ago, no voice changes, no significant sinus pressure or drainage, no acid reflux.     Past medical history, surgical history, and family history reviewed.  Current medication list and allergy/intolerance information reviewed.   (See remainder of HPI, ROS, Phys Exam below)   Results for orders placed or performed in visit on 07/30/18 (from the past 72 hour(s))  POCT HgB A1C     Status: Abnormal   Collection Time: 07/30/18  8:58 AM  Result Value Ref Range   Hemoglobin A1C 6.2 (A) 4.0 - 5.6 %   HbA1c POC (<> result, manual entry)     HbA1c, POC (prediabetic range)     HbA1c, POC (controlled diabetic range)              ASSESSMENT/PLAN:   Prediabetes - Plan: POCT HgB A1C, amoxicillin-clavulanate (AUGMENTIN) 875-125 MG tablet, fluticasone (FLONASE) 50 MCG/ACT nasal spray  Postnasal drip - Plan: amoxicillin-clavulanate (AUGMENTIN) 875-125 MG tablet, fluticasone (FLONASE) 50 MCG/ACT nasal spray  Sore throat - Plan: amoxicillin-clavulanate (AUGMENTIN) 875-125 MG tablet, fluticasone (FLONASE) 50 MCG/ACT nasal spray   Meds ordered this encounter  Medications  . amoxicillin-clavulanate (AUGMENTIN) 875-125 MG tablet    Sig: Take 1 tablet by mouth 2 (two) times daily.    Dispense:  14 tablet    Refill:  0  . fluticasone (FLONASE) 50 MCG/ACT nasal spray    Sig: Place 2 sprays into both nostrils daily.    Dispense:  16 g    Refill:  6     Given smoking  history, if sore throat does not improve with treatment for possible infection/postnasal drip, would send to ENT for laryngoscopy  Patient Instructions  Call or message me in the next week or so with update on sore throat - if better, nothing to do, if still bothering you, will send referral to ENT specialist.     Follow-up plan: Return in about 3 months (around 10/29/2018), or sooner if symptoms worsen or fail to improve, for ANNUAL PHYSICAL VISIT AND MEDICARE WELLNESS VISIT .                 ############################################ ############################################ ############################################ ############################################    Outpatient Encounter Medications as of 07/30/2018  Medication Sig Note  . AMBULATORY NON FORMULARY MEDICATION One Touch Verio glucose monitor.  Check blood sugar daily. Dx type 2 diabetes. E11.9   . AMBULATORY NON FORMULARY MEDICATION One Touch Verio glucose monitor test strips and lancets.  Check blood sugar daily. Dx type 2 diabetes. E11.9   . aspirin 81 MG tablet Take 81 mg by mouth daily.     Marland Kitchen glucose blood (ACCU-CHEK AVIVA) test strip Test blood sugar twice daily. Dx: E11.9   . lovastatin (MEVACOR) 20 MG tablet TAKE ONE TABLET BY MOUTH AT BEDTIME   . Multiple Vitamins-Minerals (MULTIVITAMIN PO) Take by mouth.   . primidone (MYSOLINE) 50 MG tablet Take 1 tablet (50 mg total) 3 (three) times daily by mouth. (Patient  taking differently: Take 50 mg by mouth 4 (four) times daily. ) 07/30/2018: As per pt, taking 5 tabs daily at night   No facility-administered encounter medications on file as of 07/30/2018.    No Known Allergies    Review of Systems:  Constitutional: No recent illness  HEENT: No  headache, no vision change  Cardiac: No  chest pain, No  pressure, No palpitations  Respiratory:  No  shortness of breath. No  Cough  Gastrointestinal: No  abdominal pain, no change on bowel  habits  Musculoskeletal: No new myalgia/arthralgia  Skin: No  Rash  Hem/Onc: No  easy bruising/bleeding, No  abnormal lumps/bumps  Neurologic: No  weakness, No  Dizziness  Psychiatric: No  concerns with depression, No  concerns with anxiety  Exam:  BP 130/67 (BP Location: Left Arm, Patient Position: Sitting, Cuff Size: Normal)   Pulse 63   Temp 98.1 F (36.7 C) (Oral)   Wt 179 lb 1.6 oz (81.2 kg)   BMI 25.70 kg/m   Constitutional: VS see above. General Appearance: alert, well-developed, well-nourished, NAD  Eyes: Normal lids and conjunctive, non-icteric sclera  Ears, Nose, Mouth, Throat: MMM, Normal external inspection ears/nares/mouth/lips/gums.  Pharynx no erythema or exudate.  Normal nasal mucosa.  Somewhat sclerotic tympanic membranes but no effusion/erythema or bulging  Neck: No masses, trachea midline.  No lymphadenopathy or thyromegaly  Respiratory: Normal respiratory effort. no wheeze, no rhonchi, no rales  Cardiovascular: S1/S2 normal, no murmur, no rub/gallop auscultated. RRR.   Musculoskeletal: Gait normal. Symmetric and independent movement of all extremities  Neurological: Normal balance/coordination. No tremor.  Skin: warm, dry, intact.   Psychiatric: Normal judgment/insight. Normal mood and affect. Oriented x3.   Visit summary with medication list and pertinent instructions was printed for patient to review, advised to alert [place] if any changes needed. All questions at time of visit were answered - patient instructed to contact office with any additional concerns. ER/RTC precautions were reviewed with the patient and understanding verbalized.   Follow-up plan: Return in about 3 months (around 10/29/2018), or sooner if symptoms worsen or fail to improve, for ANNUAL PHYSICAL VISIT AND MEDICARE WELLNESS VISIT .    Please note: voice recognition software was used to produce this document, and typos may escape review. Please contact Dr. Sheppard Coil for any needed  clarifications.

## 2018-07-31 NOTE — Progress Notes (Signed)
Subjective:   Patrick Perkins is a 72 y.o. male who presents for Medicare Annual/Subsequent preventive examination.  Review of Systems:  No ROS.  Medicare Wellness Visit. Additional risk factors are reflected in the social history.   Cardiac Risk Factors include: advanced age (>43men, >35 women);hypertension Sleep patterns:    Home Safety/Smoke Alarms: Feels safe in home. Smoke alarms in place.  Living environment;Lives with wife in 1 story. Shower is step over tub does have mats in place in tub. Is going to redo bath and put grab bars in shower.     Male:   CCS-utd  Due 01/22/2024 Eye Exam- utd due 12/04/2018    PSA- aged out Lab Results  Component Value Date   PSA 1.32 08/29/2012   PSA 1.69 01/07/2010   PSA 1.30 12/15/2008       Objective:    Vitals: BP 122/82   Pulse 62   Ht 5\' 10"  (1.778 m)   Wt 180 lb (81.6 kg)   SpO2 98%   BMI 25.83 kg/m   Body mass index is 25.83 kg/m.  Advanced Directives 08/07/2018 05/07/2018 03/10/2015  Does Patient Have a Medical Advance Directive? Yes Yes Yes  Type of Advance Directive Living will Living will [place];Living will  Does patient want to make changes to medical advance directive? No - Patient declined - -  Copy of [place] in Chart? - - No - copy requested    Tobacco Social History   Tobacco Use  Smoking Status Former Smoker  . Last attempt to quit: 09/30/2009  . Years since quitting: 8.8  Smokeless Tobacco Never Used     Counseling given: No   Clinical Intake:  Pre-visit preparation completed: Yes  Pain : No/denies pain     Nutritional Risks: None Diabetes: No  How often do you need to have someone help you when you read instructions, pamphlets, or other written materials from your doctor or pharmacy?: 1 - Never What is the last grade level you completed in school?: college  Interpreter Needed?: No     Past Medical History:  Diagnosis Date  . Diabetes  mellitus   . ED (erectile dysfunction)    Past Surgical History:  Procedure Laterality Date  . HERNIA REPAIR  2005   double   Family History  Problem Relation Age of Onset  . Cancer Father 35       colon   Social History   Socioeconomic History  . Marital status: Married    Spouse name: Peter Congo  . Number of children: 1  . Years of education: college  . Highest education level: 12th grade  Occupational History  . 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 Financial resource strain: Not hard at all  . Food insecurity:    Worry: Never true    Inability: Never true  . Transportation needs:    Medical: No    Non-medical: No  Tobacco Use  . Smoking status: Former Smoker    Last attempt to quit: 09/30/2009    Years since quitting: 8.8  . Smokeless tobacco: Never Used  Substance and Sexual Activity  . Alcohol use: Yes    Alcohol/week: 1.0 standard drinks    Types: 1 Cans of beer per week    Comment: occasionally  . Drug use: Never  . Sexual activity: Not Currently  Lifestyle  . Physical activity:  Days per week: 3 days    Minutes per session: 40 min  . Stress: Not at all  Relationships  . Social connections:    Talks on phone: Once a week    Gets together: Three times a week    Attends religious service: Never    Active member of club or organization: Yes    Attends meetings of clubs or organizations: More than 4 times per year    Relationship status: Widowed  Other Topics Concern  . Not on file  Social History Narrative   Plays golf twice a week    Outpatient Encounter Medications as of 08/07/2018  Medication Sig  . AMBULATORY NON FORMULARY MEDICATION One Touch Verio glucose monitor.  Check blood sugar daily. Dx type 2 diabetes. E11.9  . AMBULATORY NON FORMULARY MEDICATION One Touch Verio glucose monitor test strips and lancets.  Check blood sugar daily. Dx type 2 diabetes. E11.9  . aspirin 81 MG  tablet Take 81 mg by mouth daily.    . fluticasone (FLONASE) 50 MCG/ACT nasal spray Place 2 sprays into both nostrils daily.  Marland Kitchen glucose blood (ACCU-CHEK AVIVA) test strip Test blood sugar twice daily. Dx: E11.9  . lovastatin (MEVACOR) 20 MG tablet TAKE ONE TABLET BY MOUTH AT BEDTIME  . Multiple Vitamins-Minerals (MULTIVITAMIN PO) Take by mouth.  . primidone (MYSOLINE) 50 MG tablet Take 1 tablet (50 mg total) 3 (three) times daily by mouth. (Patient taking differently: Take 50 mg by mouth 4 (four) times daily. )  . [DISCONTINUED] amoxicillin-clavulanate (AUGMENTIN) 875-125 MG tablet Take 1 tablet by mouth 2 (two) times daily.   No facility-administered encounter medications on file as of 08/07/2018.     Activities of Daily Living In your present state of health, do you have any difficulty performing the following activities: 08/07/2018  Hearing? Y  Comment has trouble hearing high frequency tones  Vision? N  Difficulty concentrating or making decisions? N  Walking or climbing stairs? N  Dressing or bathing? N  Doing errands, shopping? N  Preparing Food and eating ? N  Using the Toilet? N  In the past six months, have you accidently leaked urine? N  Do you have problems with loss of bowel control? N  Managing your Medications? N  Managing your Finances? N  Housekeeping or managing your Housekeeping? N  Some recent data might be hidden    Patient Care Team: Emeterio Reeve, DO as PCP - General (Osteopathic Medicine) Orbie Hurst, MD as Referring Physician (Dermatology) Lawrence Marseilles, MD as Referring Physician (Neurology)   Assessment:   This is a routine wellness examination for [name].. Physical assessment deferred to PCP. Checks blood sugars at home everyday 130-135 range.  Exercise Activities and Dietary recommendations Current Exercise Habits: Home exercise routine, Type of exercise: walking;Other - see comments(golf 2 times a wek), Time (Minutes): 40, Frequency (Times/Week):  3, Weekly Exercise (Minutes/Week): 120, Intensity: Mild Diet healthy diet, with vegetables, doesn't eat a whole lot of fruit Breakfast:sausage and eggs with coffee 2 cups Lunch: sometimes eats Dinner:  Meat or fish, vegetables. Does watch amount of salt intake to help keep blood pressure down. Drinks water all day   Goals    . DIET - REDUCE SODIUM INTAKE     Decrease salt intake to help keep blood pressure unde control.    . Exercise 150 min/wk Moderate Activity     Continue to exercise as you are doing. Your doing a great job at staying healthy.  Fall Risk Fall Risk  08/07/2018 10/03/2016  Falls in the past year? No No   Is the patient's home free of loose throw rugs in walkways, pet beds, electrical cords, etc?   yes      Grab bars in the bathroom? no      Handrails on the stairs?   no      Adequate lighting?   yes  Depression Screen PHQ 2/9 Scores 08/07/2018 07/30/2018 04/03/2017 10/03/2016  PHQ - 2 Score 0 0 0 0  PHQ- 9 Score 0 1 0 -    Cognitive Function     6CIT Screen 08/07/2018  What Year? 0 points  What month? 0 points  What time? 0 points  Count back from 20 0 points  Months in reverse 0 points  Repeat phrase 0 points  Total Score 0    Immunization History  Administered Date(s) Administered  . H1N1 12/14/2008  . Influenza Split 09/04/2011, 09/03/2012  . Influenza Whole 09/06/2009, 09/20/2010, 08/20/2013  . Influenza, High Dose Seasonal PF 09/18/2017  . Influenza-Unspecified 08/20/2014, 07/22/2015, 09/03/2016  . Pneumococcal Conjugate-13 04/23/2015  . Pneumococcal Polysaccharide-23 09/20/2010, 04/03/2017  . Td 12/14/2008  . Zoster 02/29/2012  . Zoster Recombinat (Shingrix) 04/07/2017    Screening Tests Health Maintenance  Topic Date Due  . FOOT EXAM  10/03/2017  . INFLUENZA VACCINE  06/20/2018  . Hepatitis C Screening  11/20/2018 (Originally Dec 19, 1945)  . URINE MICROALBUMIN  10/05/2018  . OPHTHALMOLOGY EXAM  12/04/2018  . TETANUS/TDAP   12/14/2018  . HEMOGLOBIN A1C  01/28/2019  . COLONOSCOPY  01/22/2024  . PNA vac Low Risk Adult  Completed       Plan:  Please schedule your next medicare wellness visit with me in 1 yr.  Mr. Crymes , Thank you for taking time to come for your Medicare Wellness Visit. I appreciate your ongoing commitment to your health goals. Please review the following plan we discussed and let me know if I can assist you in the future.  Continue to monitor sugars at home as we discussed.  These are the goals we discussed: Goals    . DIET - REDUCE SODIUM INTAKE     Decrease salt intake to help keep blood pressure unde control.    . Exercise 150 min/wk Moderate Activity     Continue to exercise as you are doing. Your doing a great job at staying healthy.       This is a list of the screening recommended for you and due dates:  Health Maintenance  Topic Date Due  . Complete foot exam   10/03/2017  . Flu Shot  06/20/2018  .  Hepatitis C: One time screening is recommended by Center for Disease Control  (CDC) for  adults born from [birth year] through 1964.   11/20/2018*  . Urine Protein Check  10/05/2018  . Eye exam for diabetics  12/04/2018  . Tetanus Vaccine  12/14/2018  . Hemoglobin A1C  01/28/2019  . Colon Cancer Screening  01/22/2024  . Pneumonia vaccines  Completed  *Topic was postponed. The date shown is not the original due date.     I have personally reviewed and noted the following in the patient's chart:   . Medical and social history . Use of alcohol, tobacco or illicit drugs  . Current medications and supplements . Functional ability and status . Nutritional status . Physical activity . Advanced directives . List of other physicians . Hospitalizations, surgeries, and ER visits in previous 12 months .  Vitals . Screenings to include cognitive, depression, and falls . Referrals and appointments  In addition, I have reviewed and discussed with patient certain preventive  protocols, quality metrics, and best practice recommendations. A written personalized care plan for preventive services as well as general preventive health recommendations were provided to patient.     Joanne Chars, LPN  11/30/5518

## 2018-08-07 ENCOUNTER — Ambulatory Visit (INDEPENDENT_AMBULATORY_CARE_PROVIDER_SITE_OTHER): Payer: Medicare Other | Admitting: *Deleted

## 2018-08-07 VITALS — BP 122/82 | HR 62 | Ht 70.0 in | Wt 180.0 lb

## 2018-08-07 DIAGNOSIS — Z0000 Encounter for general adult medical examination without abnormal findings: Secondary | ICD-10-CM

## 2018-08-07 NOTE — Patient Instructions (Addendum)
Please schedule your next medicare wellness visit with me in 1 yr.  Patrick Perkins , Thank you for taking time to come for your Medicare Wellness Visit. I appreciate your ongoing commitment to your health goals. Please review the following plan we discussed and let me know if I can assist you in the future.  Continue to monitor sugars at home as we discussed.  These are the goals we discussed: Goals    . DIET - REDUCE SODIUM INTAKE     Decrease salt intake to help keep blood pressure unde control.    . Exercise 150 min/wk Moderate Activity     Continue to exercise as you are doing. Your doing a great job at staying healthy.        Health Maintenance, Male A healthy lifestyle and preventive care is important for your health and wellness. Ask your health care provider about what schedule of regular examinations is right for you. What should I know about weight and diet? Eat a Healthy Diet  Eat plenty of vegetables, fruits, whole grains, low-fat dairy products, and lean protein.  Do not eat a lot of foods high in solid fats, added sugars, or salt.  Maintain a Healthy Weight Regular exercise can help you achieve or maintain a healthy weight. You should:  Do at least 150 minutes of exercise each week. The exercise should increase your heart rate and make you sweat (moderate-intensity exercise).  Do strength-training exercises at least twice a week.  Watch Your Levels of Cholesterol and Blood Lipids  Have your blood tested for lipids and cholesterol every 5 years starting at 71 years of age. If you are at high risk for heart disease, you should start having your blood tested when you are 71 years old. You may need to have your cholesterol levels checked more often if: ? Your lipid or cholesterol levels are high. ? You are older than 71 years of age. ? You are at high risk for heart disease.  What should I know about cancer screening? Many types of cancers can be detected early and  may often be prevented. Lung Cancer  You should be screened every year for lung cancer if: ? You are a current smoker who has smoked for at least 30 years. ? You are a former smoker who has quit within the past 15 years.  Talk to your health care provider about your screening options, when you should start screening, and how often you should be screened.  Colorectal Cancer  Routine colorectal cancer screening usually begins at 71 years of age and should be repeated every 5-10 years until you are 71 years old. You may need to be screened more often if early forms of precancerous polyps or small growths are found. Your health care provider may recommend screening at an earlier age if you have risk factors for colon cancer.  Your health care provider may recommend using home test kits to check for hidden blood in the stool.  A small camera at the end of a tube can be used to examine your colon (sigmoidoscopy or colonoscopy). This checks for the earliest forms of colorectal cancer.  Prostate and Testicular Cancer  Depending on your age and overall health, your health care provider may do certain tests to screen for prostate and testicular cancer.  Talk to your health care provider about any symptoms or concerns you have about testicular or prostate cancer.  Skin Cancer  Check your skin from head to toe  regularly.  Tell your health care provider about any new moles or changes in moles, especially if: ? There is a change in a mole's size, shape, or color. ? You have a mole that is larger than a pencil eraser.  Always use sunscreen. Apply sunscreen liberally and repeat throughout the day.  Protect yourself by wearing long sleeves, pants, a wide-brimmed hat, and sunglasses when outside.  What should I know about heart disease, diabetes, and high blood pressure?  If you are 36-87 years of age, have your blood pressure checked every 3-5 years. If you are 4 years of age or older, have your  blood pressure checked every year. You should have your blood pressure measured twice-once when you are at a hospital or clinic, and once when you are not at a hospital or clinic. Record the average of the two measurements. To check your blood pressure when you are not at a hospital or clinic, you can use: ? An automated blood pressure machine at a pharmacy. ? A home blood pressure monitor.  Talk to your health care provider about your target blood pressure.  If you are between 58-2 years old, ask your health care provider if you should take aspirin to prevent heart disease.  Have regular diabetes screenings by checking your fasting blood sugar level. ? If you are at a normal weight and have a low risk for diabetes, have this test once every three years after the age of 57. ? If you are overweight and have a high risk for diabetes, consider being tested at a younger age or more often.  A one-time screening for abdominal aortic aneurysm (AAA) by ultrasound is recommended for men aged 45-75 years who are current or former smokers. What should I know about preventing infection? Hepatitis B If you have a higher risk for hepatitis B, you should be screened for this virus. Talk with your health care provider to find out if you are at risk for hepatitis B infection. Hepatitis C Blood testing is recommended for:  Everyone born from [birth year] through 1964.  Anyone with known risk factors for hepatitis C.  Sexually Transmitted Diseases (STDs)  You should be screened each year for STDs including gonorrhea and chlamydia if: ? You are sexually active and are younger than 71 years of age. ? You are older than 71 years of age and your health care provider tells you that you are at risk for this type of infection. ? Your sexual activity has changed since you were last screened and you are at an increased risk for chlamydia or gonorrhea. Ask your health care provider if you are at risk.  Talk with your  health care provider about whether you are at high risk of being infected with HIV. Your health care provider may recommend a prescription medicine to help prevent HIV infection.  What else can I do?  Schedule regular health, dental, and eye exams.  Stay current with your vaccines (immunizations).  Do not use any tobacco products, such as cigarettes, chewing tobacco, and e-cigarettes. If you need help quitting, ask your health care provider.  Limit alcohol intake to no more than 2 drinks per day. One drink equals 12 ounces of beer, 5 ounces of wine, or 1 ounces of hard liquor.  Do not use street drugs.  Do not share needles.  Ask your health care provider for help if you need support or information about quitting drugs.  Tell your health care provider if you often  feel depressed.  Tell your health care provider if you have ever been abused or do not feel safe at home. This information is not intended to replace advice given to you by your health care provider. Make sure you discuss any questions you have with your health care provider. Document Released: 05/04/2008 Document Revised: 07/05/2016 Document Reviewed: 08/10/2015 Elsevier Interactive Patient Education  [employer].

## 2018-09-10 ENCOUNTER — Encounter: Payer: Self-pay | Admitting: Family Medicine

## 2018-09-10 ENCOUNTER — Ambulatory Visit (INDEPENDENT_AMBULATORY_CARE_PROVIDER_SITE_OTHER): Payer: Medicare Other | Admitting: Family Medicine

## 2018-09-10 VITALS — BP 144/77 | HR 66 | Ht 70.5 in | Wt 180.0 lb

## 2018-09-10 DIAGNOSIS — S39012A Strain of muscle, fascia and tendon of lower back, initial encounter: Secondary | ICD-10-CM | POA: Diagnosis not present

## 2018-09-10 DIAGNOSIS — Z23 Encounter for immunization: Secondary | ICD-10-CM | POA: Diagnosis not present

## 2018-09-10 NOTE — Progress Notes (Signed)
Patrick Perkins is a 72 y.o. male who presents to [facility] today for right low back pain.  But notes a 3-week history of pain in the right low back into the right buttocks.  Pain is slightly improving.  He denies any injury but notes pain is worse with golfing and walking and activity.  He denies any significant radiating pain weakness or numbness.  He is tried Tylenol which helps.  He is tried some gentle exercises at home which also help a bit.  He feels well otherwise no fevers chills nausea vomiting or diarrhea.  Symptoms are moderate and do interfere with his quality of life.    ROS:  As above  Exam:  BP (!) 144/77   Pulse 66   Ht 5' 10.5" (1.791 m)   Wt 180 lb (81.6 kg)   BMI 25.46 kg/m  General: Well Developed, well nourished, and in no acute distress.  Neuro/Psych: Alert and oriented x3, extra-ocular muscles intact, able to move all 4 extremities, sensation grossly intact. Skin: Warm and dry, no rashes noted.  Respiratory: Not using accessory muscles, speaking in full sentences, trachea midline.  Cardiovascular: Pulses palpable, no extremity edema. Abdomen: Does not appear distended. MSK:  Spine: Nontender to spinal midline.  Mildly tender to palpation right SI joint area and right buttocks. Lumbar motion is intact.  Lower extremity strength reflexes and sensation are intact. Right hip normal-appearing nontender normal motion normal strength.    Lab and Radiology Results EXAM: DG HIP (WITH OR WITHOUT PELVIS) 2-3V LEFT  COMPARISON:  None.  FINDINGS: There is no evidence of hip fracture or dislocation. There is no evidence of arthropathy or other focal bone abnormality.  IMPRESSION: Negative.   Electronically Signed   By: Patrick Perkins M.D. I personally (independently) visualized and performed the interpretation of the images attached in this note.     Assessment and Plan: 72 y.o. male with right low  back pain likely lumbosacral strain.  Plan for physical therapy TENS unit heating pad.  If not better return to clinic would consider SI joint injection at that time.  Flu vaccine given    Orders Placed This Encounter  Procedures  . Flu Vaccine QUAD 36+ mos IM  . Ambulatory referral to Physical Therapy    Referral Priority:   Routine    Referral Type:   Physical Medicine    Referral Reason:   Specialty Services Required    Requested Specialty:   Physical Therapy   No orders of the defined types were placed in this encounter.   Historical information moved to improve visibility of documentation.  Past Medical History:  Diagnosis Date  . Diabetes mellitus   . ED (erectile dysfunction)    Past Surgical History:  Procedure Laterality Date  . HERNIA REPAIR  2005   double   Social History   Tobacco Use  . Smoking status: Former Smoker    Last attempt to quit: 09/30/2009    Years since quitting: 8.9  . Smokeless tobacco: Never Used  Substance Use Topics  . Alcohol use: Yes    Alcohol/week: 1.0 standard drinks    Types: 1 Cans of beer per week    Comment: occasionally   family history includes Cancer (age of onset: 33) in his father.  Medications: Current Outpatient Medications  Medication Sig Dispense Refill  . AMBULATORY NON FORMULARY MEDICATION One Touch Verio glucose monitor.  Check blood sugar daily. Dx type 2 diabetes. E11.9  1 Units 0  . AMBULATORY NON FORMULARY MEDICATION One Touch Verio glucose monitor test strips and lancets.  Check blood sugar daily. Dx type 2 diabetes. E11.9 50 Units 11  . aspirin 81 MG tablet Take 81 mg by mouth daily.      . fluticasone (FLONASE) 50 MCG/ACT nasal spray Place 2 sprays into both nostrils daily. 16 g 6  . glucose blood (ACCU-CHEK AVIVA) test strip Test blood sugar twice daily. Dx: E11.9 60 each 12  . lovastatin (MEVACOR) 20 MG tablet TAKE ONE TABLET BY MOUTH AT BEDTIME 90 tablet 3  . Multiple Vitamins-Minerals (MULTIVITAMIN PO)  Take by mouth.    . primidone (MYSOLINE) 50 MG tablet Take 1 tablet (50 mg total) 3 (three) times daily by mouth. (Patient taking differently: Take 50 mg by mouth 4 (four) times daily. ) 270 tablet 0   No current facility-administered medications for this visit.    No Known Allergies    Discussed warning signs or symptoms. Please see discharge instructions. Patient expresses understanding.

## 2018-09-10 NOTE — Progress Notes (Signed)
ue 

## 2018-09-10 NOTE — Patient Instructions (Addendum)
Thank you for coming in today. Attend PT Use a heating pad and TENS unit as needed.   Recheck with me in 4 weeks if not better.  Return sooner if needed.      TENS UNIT: This is helpful for muscle pain and spasm.   Search and Purchase a TENS 7000 2nd edition at  www.tenspros.com or www.[employer].com It should be less than $30.     TENS unit instructions: Do not shower or bathe with the unit on Turn the unit off before removing electrodes or batteries If the electrodes lose stickiness add a drop of water to the electrodes after they are disconnected from the unit and place on plastic sheet. If you continued to have difficulty, call the TENS unit company to purchase more electrodes. Do not apply lotion on the skin area prior to use. Make sure the skin is clean and dry as this will help prolong the life of the electrodes. After use, always check skin for unusual red areas, rash or other skin difficulties. If there are any skin problems, does not apply electrodes to the same area. Never remove the electrodes from the unit by pulling the wires. Do not use the TENS unit or electrodes other than as directed. Do not change electrode placement without consultating your therapist or physician. Keep 2 fingers with between each electrode. Wear time ratio is 2:1, on to off times.    For example on for 30 minutes off for 15 minutes and then on for 30 minutes off for 15 minutes

## 2018-09-11 ENCOUNTER — Telehealth: Payer: Self-pay | Admitting: Osteopathic Medicine

## 2018-09-11 NOTE — Telephone Encounter (Signed)
flonase removed from med list per Pt request

## 2018-09-19 ENCOUNTER — Ambulatory Visit (INDEPENDENT_AMBULATORY_CARE_PROVIDER_SITE_OTHER): Payer: Medicare Other | Admitting: Rehabilitative and Restorative Service Providers"

## 2018-09-19 ENCOUNTER — Encounter: Payer: Self-pay | Admitting: Rehabilitative and Restorative Service Providers"

## 2018-09-19 DIAGNOSIS — R531 Weakness: Secondary | ICD-10-CM | POA: Diagnosis not present

## 2018-09-19 DIAGNOSIS — R29898 Other symptoms and signs involving the musculoskeletal system: Secondary | ICD-10-CM

## 2018-09-19 DIAGNOSIS — R293 Abnormal posture: Secondary | ICD-10-CM | POA: Diagnosis not present

## 2018-09-19 DIAGNOSIS — M25551 Pain in right hip: Secondary | ICD-10-CM

## 2018-09-19 NOTE — Therapy (Signed)
[facility], [place], [address] Phone: [phone]   Fax:  [phone]  Physical Therapy Evaluation  Patient Details  Name: Patrick Perkins MRN: [id number] Date of Birth: [birth year] Referring Provider (PT): Dr Lynne Leader    Encounter Date: 09/19/2018  PT End of Session - 09/19/18 1201    Visit Number  1    Number of Visits  12    Date for PT Re-Evaluation  10/31/18    PT Start Time  1104    PT Stop Time  1205    PT Time Calculation (min)  61 min    Activity Tolerance  Patient tolerated treatment well       Past Medical History:  Diagnosis Date  . Diabetes mellitus   . ED (erectile dysfunction)     Past Surgical History:  Procedure Laterality Date  . HERNIA REPAIR  2005   double    There were no vitals filed for this visit.   Subjective Assessment - 09/19/18 1114    Subjective  Patient reports that he experienced Rt hip pain on the 4 th day of playing golf. he felt a painin the Rt hip with his follow through to golf swing. Pain has persisted but may be getting a bit better.     Pertinent History  AODM; cervical dysfunction managed with HEP     Patient Stated Goals  get rid of the hip  pain and return to golfing     Currently in Pain?  Yes    Pain Score  0-No pain    Pain Location  Hip    Pain Orientation  Right    Pain Descriptors / Indicators  Nagging;Sharp    Pain Type  Acute pain    Pain Radiating Towards  Rt posterior hip     Pain Onset  More than a month ago    Pain Frequency  Intermittent    Aggravating Factors   golf swing; twisting back; getting in and out of the floor     Pain Relieving Factors  lying down          [facility] PT Assessment - 09/19/18 0001      Assessment   Medical Diagnosis  Rt hip pain     Referring Provider (PT)  Dr Lynne Leader     Onset Date/Surgical Date  08/14/18    Hand Dominance  Right    Next MD Visit  PRN    Prior Therapy  yes for cervical  dysfunctioin       Precautions   Precautions  None      Balance Screen   Has the patient fallen in the past 6 months  No    Has the patient had a decrease in activity level because of a fear of falling?   No    Is the patient reluctant to leave their home because of a fear of falling?   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     Leisure  golfing; yard work; Scientist, research (life sciences) currently       Observation/Other Assessments   Focus on Therapeutic Outcomes (FOTO)   42% limitation  Sensation   Additional Comments  WFL's       Posture/Postural Control   Posture Comments  head forward; shoudlers rounded       AROM   Right/Left Hip  --   end range tightness bilat Rt > Lt    Lumbar Flexion  80%    Lumbar Extension  50%    Lumbar - Right Side Bend  75%    Lumbar - Left Side Bend  70%    Lumbar - Right Rotation  50%    Lumbar - Left Rotation  50%      Strength   Overall Strength Comments  5/5 bilat LE's       Flexibility   Hamstrings  tight Rt > Lt     Quadriceps  tight Rt > Lt     ITB  tight Rt > Lt     Piriformis  tight Rt > Lt       Palpation   Spinal mobility  hypomobility w/ CPA mobs lumbar spine     Palpation comment  muscular tightness Rt lateral sacral border; piriformis      Ambulation/Gait   Gait Comments  WFL's                 Objective measurements completed on examination: See above findings.      [facility] Adult PT Treatment/Exercise - 09/19/18 0001      Therapeutic Activites    Therapeutic Activities  --   myofacial ball release work Rt posterior hip supine/standing     Knee/Hip Exercises: Stretches   Passive Hamstring Stretch  Right;2 reps;30 seconds   supine with strap    Quad Stretch  Right;2 reps;30 seconds   prone with  strap    ITB Stretch  Right;2 reps;30 seconds   supine with strap    Piriformis Stretch  Right;4 reps;30 seconds   supine travell varying angles      Moist Heat Therapy   Number Minutes Moist Heat  20 Minutes    Moist Heat Location  Hip      Electrical Stimulation   Electrical Stimulation Location  Rt posterior hip    Electrical Stimulation Action  IFC    Electrical Stimulation Parameters  to tolerance    Electrical Stimulation Goals  Pain;Tone             PT Education - 09/19/18 1143    Education Details  HEP     Person(s) Educated  Patient    Methods  Explanation;Demonstration;Tactile cues;Verbal cues;Handout    Comprehension  Verbalized understanding;Returned demonstration;Verbal cues required;Tactile cues required          PT Long Term Goals - 09/19/18 1213      PT LONG TERM GOAL #1   Title  I with advanced HEP 10/31/18    Time  6    Period  Weeks    Status  New      PT LONG TERM GOAL #2   Title  report =/> 75% improvements in pain in the Rt hip 10/31/18    Time  6    Period  Weeks    Status  New      PT LONG TERM GOAL #3   Title  patient reports return to golf and functional activities with no pain 10/31/18    Time  6    Period  Weeks    Status  New      PT LONG TERM GOAL #4   Title  improve  FOTO to </= 33% limitation 10/31/18    Time  6    Period  Weeks    Status  New      PT LONG TERM GOAL #5   Title  improve tissue extensibility Rt posterior hip wit hno palpable trigger points through the piriformis/gluts 10/31/18    Time  6    Period  Weeks    Status  New             Plan - 09/19/18 1201    Clinical Impression Statement  Patient presents with Rt posterior hip pain which has been present for the past 5-6 weeks. Pain started on his 4th day of playing 18 holes/golf per day and has persisted since that time. Symptoms are improved since initial onset. He continues to have pain in an intermittent basis. He presents clinically with some  tightness Rt hip/LE compared to Lt; muscular tightness through the posterior Rt hip; pain with functional activities on an intermittent basis. He will benefit from PT to address problems identified.     Clinical Presentation  Stable    Clinical Decision Making  Low    Rehab Potential  Good    PT Frequency  2x / week    PT Duration  6 weeks    PT Treatment/Interventions  Patient/family education;ADLs/Self Care Home Management;Cryotherapy;Electrical Stimulation;Iontophoresis 4mg /ml Dexamethasone;Moist Heat;Ultrasound;Dry needling;Manual techniques;Neuromuscular re-education;Therapeutic activities;Therapeutic exercise    PT Next Visit Plan  review HEP; trial of hip flexor stretch; manual work Rt piriformis/gluts; modalities as indicated     Consulted and Agree with Plan of Care  Patient       Patient will benefit from skilled therapeutic intervention in order to improve the following deficits and impairments:  Postural dysfunction, Improper body mechanics, Pain, Increased fascial restricitons, Increased muscle spasms, Decreased activity tolerance  Visit Diagnosis: Pain of right hip joint - Plan: PT plan of care cert/re-cert  Abnormal posture - Plan: PT plan of care cert/re-cert  Other symptoms and signs involving the musculoskeletal system - Plan: PT plan of care cert/re-cert  Weakness generalized - Plan: PT plan of care cert/re-cert     Problem List Patient Active Problem List   Diagnosis Date Noted  . Neuroforaminal stenosis of cervical spine 04/26/2018  . Degenerative disc disease, cervical 04/26/2018  . Left hip pain 04/26/2018  . Cervical radiculitis 07/09/2014  . Ganglion cyst 03/23/2014  . Encounter for monitoring statin therapy 02/24/2013  . Benign essential tremor 08/31/2011  . Hyperlipemia 08/31/2011  . Type 2 diabetes mellitus ([place]) 01/07/2010  . COLONIC POLYPS 01/01/2009  . DIVERTICULOSIS OF COLON 01/01/2009  . Former smoker 12/14/2008    Duffy Dantonio Nilda Simmer PT, MPH   09/19/2018, 12:18 PM  [facility] [place], [place], [address] Phone: [phone]   Fax:  [phone]  Name: Patrick Perkins MRN: [id number] Date of Birth: December 30, 1945

## 2018-09-19 NOTE — Patient Instructions (Signed)
HIP: Hamstrings - Supine  Place strap around foot. Raise leg up, keeping knee straight.  Bend opposite knee to protect back if indicated. Hold 30 seconds. 3 reps per set, 2-3 sets per day  Outer Hip Stretch: Reclined IT Band Stretch (Strap)   Strap around one foot, pull leg across body until you feel a pull or stretch in the outside of your hip, with shoulders on mat. Hold for 30 seconds. Repeat 3 times each leg. 2-3 times/day.  Piriformis Stretch   Lying on back, pull right knee toward opposite shoulder. Hold 30 seconds. Repeat 3 times. Do 2-3 sessions per day.   KNEE: Quadriceps - Prone    Place strap around ankle. Bring ankle toward buttocks. Press hip into surface. Hold 30 seconds. Repeat 3 times per session. Do 2-3 sessions per day.   Self massage using ~ 4 inch plastic ball

## 2018-09-25 ENCOUNTER — Encounter: Payer: Medicare Other | Admitting: Rehabilitative and Restorative Service Providers"

## 2018-09-27 ENCOUNTER — Encounter: Payer: Self-pay | Admitting: Rehabilitative and Restorative Service Providers"

## 2018-09-27 ENCOUNTER — Ambulatory Visit (INDEPENDENT_AMBULATORY_CARE_PROVIDER_SITE_OTHER): Payer: Medicare Other | Admitting: Rehabilitative and Restorative Service Providers"

## 2018-09-27 DIAGNOSIS — M25551 Pain in right hip: Secondary | ICD-10-CM | POA: Diagnosis not present

## 2018-09-27 DIAGNOSIS — R531 Weakness: Secondary | ICD-10-CM | POA: Diagnosis not present

## 2018-09-27 DIAGNOSIS — R29898 Other symptoms and signs involving the musculoskeletal system: Secondary | ICD-10-CM

## 2018-09-27 DIAGNOSIS — R293 Abnormal posture: Secondary | ICD-10-CM

## 2018-09-27 NOTE — Therapy (Addendum)
Patrick Perkins  Patrick Perkins, [place], [address] Phone: [phone]   Fax:  [phone]  Physical Therapy Treatment  Patient Details  Name: Patrick Perkins MRN: [id number] Date of Birth: [birth year] Referring Provider (PT): Dr Lynne Leader    Encounter Date: 09/27/2018  PT End of Session - 09/27/18 1017    Visit Number  2    Number of Visits  12    Date for PT Re-Evaluation  10/31/18    PT Start Time  1017    PT Stop Time  1111    PT Time Calculation (min)  54 min    Activity Tolerance  Patient tolerated treatment well       Past Medical History:  Diagnosis Date  . Diabetes mellitus   . ED (erectile dysfunction)     Past Surgical History:  Procedure Laterality Date  . HERNIA REPAIR  2005   double    There were no vitals filed for this visit.  Subjective Assessment - 09/27/18 1020    Subjective  Patient reports that his hip feels "much better" He does not have much pain in the hip and has not noticed pain since about the day after he was here last. He is having some pain in the neck now. He may have swung the golf club in an awkward way.     Currently in Pain?  No/denies                       [facility] Adult PT Treatment/Exercise - 09/27/18 0001      Knee/Hip Exercises: Stretches   Passive Hamstring Stretch  Right;2 reps;30 seconds   supine with strap    ITB Stretch  Right;2 reps;30 seconds   supine with strap    Piriformis Stretch  Right;4 reps;30 seconds   supine travell varying angles      Knee/Hip Exercises: Supine   Hip Adduction Isometric  AROM;Strengthening;Right;Left;1 set;10 reps   standing core engaged    Bridges  Strengthening;Both;1 set;10 reps    Other Supine Knee/Hip Exercises  hip abduction alternating LE's - hooklying position green TB x 10 each LE       Moist Heat Therapy   Number Minutes Moist Heat  20 Minutes    Moist Heat Location  Hip      Electrical Stimulation    Electrical Stimulation Location  bilat     Electrical Stimulation Action  IFC    Electrical Stimulation Parameters  to tolerance    Electrical Stimulation Goals  Pain;Tone                  PT Long Term Goals - 09/19/18 1213      PT LONG TERM GOAL #1   Title  I with advanced HEP 10/31/18    Time  6    Period  Weeks    Status  New      PT LONG TERM GOAL #2   Title  report =/> 75% improvements in pain in the Rt hip 10/31/18    Time  6    Period  Weeks    Status  New      PT LONG TERM GOAL #3   Title  patient reports return to golf and functional activities with no pain 10/31/18    Time  6    Period  Weeks    Status  New      PT LONG TERM GOAL #4  Title  improve FOTO to </= 33% limitation 10/31/18    Time  6    Period  Weeks    Status  New      PT LONG TERM GOAL #5   Title  improve tissue extensibility Rt posterior hip wit hno palpable trigger points through the piriformis/gluts 10/31/18    Time  6    Period  Weeks    Status  New            Plan - 09/27/18 1018    Clinical Impression Statement  Excellent reposnse to initial treatment and HEP. Patient reports that he has much less pain and improved flexibility. He added exercise without difficulty. Progressing well toward stated goals of therapy.     Rehab Potential  Good    PT Frequency  2x / week    PT Duration  6 weeks    PT Treatment/Interventions  Patient/family education;ADLs/Self Care Home Management;Cryotherapy;Electrical Stimulation;Iontophoresis 65m/ml Dexamethasone;Moist Heat;Ultrasound;Dry needling;Manual techniques;Neuromuscular re-education;Therapeutic activities;Therapeutic exercise    PT Next Visit Plan  review HEP; trial of hip flexor stretch; manual work Rt piriformis/gluts; modalities as indicated  - patient will schedule as needed - if he does not schedule we will place pt on hold for ~ 4 weeks     Consulted and Agree with Plan of Care  Patient       Patient will benefit from  skilled therapeutic intervention in order to improve the following deficits and impairments:  Postural dysfunction, Improper body mechanics, Pain, Increased fascial restricitons, Increased muscle spasms, Decreased activity tolerance  Visit Diagnosis: Pain of right hip joint  Abnormal posture  Other symptoms and signs involving the musculoskeletal system  Weakness generalized     Problem List Patient Active Problem List   Diagnosis Date Noted  . Neuroforaminal stenosis of cervical spine 04/26/2018  . Degenerative disc disease, cervical 04/26/2018  . Left hip pain 04/26/2018  . Cervical radiculitis 07/09/2014  . Ganglion cyst 03/23/2014  . Encounter for monitoring statin therapy 02/24/2013  . Benign essential tremor 08/31/2011  . Hyperlipemia 08/31/2011  . [place] 2 diabetes mellitus ([place] 01/07/2010  . COLONIC POLYPS 01/01/2009  . DIVERTICULOSIS OF COLON 01/01/2009  . Former smoker 12/14/2008    CMontpelierPT, MPH  09/27/2018, 11:09 AM  [facility] [address]Phone: [phone]  Fax:  [phone] Name: EKEISHAUN HAZELMRN: [id number] of Birth: 8Jun 12, 1947 PHYSICAL THERAPY DISCHARGE SUMMARY  Visits from Start of Care: 2  Current functional level related to goals / functional outcomes: See last progress note for discharge status    Remaining deficits: Should continue with exercises    Education / Equipment: HEP  Plan: Patient agrees to discharge.  Patient goals were met. Patient is being discharged due to meeting the stated rehab goals.  ?????    Jennesis Ramaswamy P. HHelene KelpPT, MPH 11/06/18 10:14 AM

## 2018-09-27 NOTE — Patient Instructions (Addendum)
Bridging    Slowly raise buttocks from floor, keeping core tight. Hold 5 sec Repeat __10__ times per set. Do __1-2__ sets per session. Do _1__ sessions per day.   Strengthening: Hip Abductor - Resisted    With band looped around both legs above knees, push one leg out ot the side holding opposite leg still. Pause. Return to midline slowly. Repeat with other leg. Repeat __10__ times per set. Do __1-2__ sets per session. Do __1__ sessions per day.   Strengthening: Hip Abduction - Resisted   Start without band.  With tubing around right leg above knee, other side toward anchor, extend leg out from side leading with heel - foot slightly behind standing leg. Repeat __10__ times per set. Do __1-2__ sets per session. Do _1___ sessions per day.

## 2018-10-26 LAB — COMPLETE METABOLIC PANEL WITH GFR
AG RATIO: 2 (calc) (ref 1.0–2.5)
ALBUMIN MSPROF: 4.4 g/dL (ref 3.6–5.1)
ALKALINE PHOSPHATASE (APISO): 74 U/L (ref 40–115)
ALT: 18 U/L (ref 9–46)
AST: 15 U/L (ref 10–35)
BILIRUBIN TOTAL: 0.5 mg/dL (ref 0.2–1.2)
BUN: 14 mg/dL (ref 7–25)
CHLORIDE: 104 mmol/L (ref 98–110)
CO2: 27 mmol/L (ref 20–32)
Calcium: 9.1 mg/dL (ref 8.6–10.3)
Creat: 1.07 mg/dL (ref 0.70–1.18)
GFR, EST AFRICAN AMERICAN: 81 mL/min/{1.73_m2} (ref >=60)
GFR, Est Non African American: 69 mL/min/{1.73_m2} (ref >=60)
GLOBULIN: 2.2 g/dL (ref 1.9–3.7)
GLUCOSE: 150 mg/dL — AB (ref 65–99)
Potassium: 5.1 mmol/L (ref 3.5–5.3)
SODIUM: 141 mmol/L (ref 135–146)
Total Protein: 6.6 g/dL (ref 6.1–8.1)

## 2018-10-26 LAB — CBC
HCT: 39.8 % (ref 38.5–50.0)
Hemoglobin: 13.5 g/dL (ref 13.2–17.1)
MCH: 32.8 pg (ref 27.0–33.0)
MCHC: 33.9 g/dL (ref 32.0–36.0)
MCV: 96.6 fL (ref 80.0–100.0)
MPV: 11.4 fL (ref 7.5–12.5)
PLATELETS: 272 10*3/uL (ref 140–400)
RBC: 4.12 10*6/uL — ABNORMAL LOW (ref 4.20–5.80)
RDW: 12.7 % (ref 11.0–15.0)
WBC: 6.2 10*3/uL (ref 3.8–10.8)

## 2018-10-26 LAB — LIPID PANEL
Cholesterol: 194 mg/dL (ref <200)
HDL: 68 mg/dL (ref >40)
LDL CHOLESTEROL (CALC): 105 mg/dL — AB
Non-HDL Cholesterol (Calc): 126 mg/dL (calc) (ref <130)
Total CHOL/HDL Ratio: 2.9 (calc) (ref <5.0)
Triglycerides: 113 mg/dL (ref <150)

## 2018-10-26 LAB — HEMOGLOBIN A1C
EAG (MMOL/L): 7.9 (calc)
HEMOGLOBIN A1C: 6.6 %{Hb} — AB (ref <5.7)
MEAN PLASMA GLUCOSE: 143 (calc)

## 2018-10-29 ENCOUNTER — Encounter: Payer: Self-pay | Admitting: Osteopathic Medicine

## 2018-10-29 ENCOUNTER — Ambulatory Visit (INDEPENDENT_AMBULATORY_CARE_PROVIDER_SITE_OTHER): Payer: Medicare Other | Admitting: Osteopathic Medicine

## 2018-10-29 VITALS — BP 143/71 | HR 68 | Temp 98.1°F | Wt 184.0 lb

## 2018-10-29 DIAGNOSIS — Z23 Encounter for immunization: Secondary | ICD-10-CM

## 2018-10-29 DIAGNOSIS — E119 Type 2 diabetes mellitus without complications: Secondary | ICD-10-CM

## 2018-10-29 DIAGNOSIS — D126 Benign neoplasm of colon, unspecified: Secondary | ICD-10-CM

## 2018-10-29 DIAGNOSIS — M5030 Other cervical disc degeneration, unspecified cervical region: Secondary | ICD-10-CM

## 2018-10-29 DIAGNOSIS — Z0000 Encounter for general adult medical examination without abnormal findings: Secondary | ICD-10-CM

## 2018-10-29 DIAGNOSIS — G250 Essential tremor: Secondary | ICD-10-CM

## 2018-10-29 DIAGNOSIS — M899 Disorder of bone, unspecified: Secondary | ICD-10-CM

## 2018-10-29 DIAGNOSIS — E785 Hyperlipidemia, unspecified: Secondary | ICD-10-CM

## 2018-10-29 LAB — POCT GLYCOSYLATED HEMOGLOBIN (HGB A1C): HEMOGLOBIN A1C: 6.6 % — AB (ref 4.0–5.6)

## 2018-10-29 LAB — POCT UA - MICROALBUMIN
Albumin/Creatinine Ratio, Urine, POC: NORMAL
Creatinine, POC: 300 mg/dL
MICROALBUMIN (UR) POC: 30 mg/L

## 2018-10-29 MED ORDER — LOVASTATIN 40 MG PO TABS: 40.0000 mg | ORAL_TABLET | Freq: Every day | ORAL | 3 refills | Status: DC

## 2018-10-29 MED ORDER — ZOSTER VAC RECOMB ADJUVANTED 50 MCG/0.5ML IM SUSR: 0.5000 mL | Freq: Once | INTRAMUSCULAR | 1 refills | Status: DC

## 2018-10-29 NOTE — Patient Instructions (Signed)
General Preventive Care  Most recent routine screening lipids/other labs: already done!   Sugars are well-controlled but up from previous, let's recheck in 3 months and if still higher than desired, may consider starting medication.   Cholesterol not quite at goal: increase lovastatin from 20 mg to 40 mg, I sent new prescription for when you're out of the 20 mg pills.   Blood pressure was borderline today but not too elevated.   Tobacco: don't!   Alcohol: responsible moderation is ok for most adults - if you have concerns about your alcohol intake, please talk to me!   Exercise: as tolerated to reduce risk of cardiovascular disease and diabetes. Strength training will also prevent osteoporosis.   Mental health: if need for mental health care (medicines, counseling, other), or concerns about moods, please let me know!   Sexual health: if ever need for STD testing, or if concerns with libido/pain problems, please let me know!   Advanced Directive: Living Will and/or Healthcare Power of Attorney recommended for all adults, regardless of age or health.  Vaccines  Flu vaccine: recommended for almost everyone, every fall. Thanks for getting this!   Shingles vaccine: You have had the shots but we just need a record of the second one. We will contact [facility].   Pneumonia vaccines: all done!  Tetanus booster: Tdap recommended every 10 years - done today, 10/29/18  Cancer screenings   Colon cancer screening: will arrange follow-up with GI to discuss repeat screening.   Prostate cancer screening: PSA blood test annually age 76-70  Lung cancer screening: scheduled!  Infection screenings . HIV & Gonorrhea/Chlamydia: screening as needed . Hepatitis C: recommended once for anyone born 13-1964.  . TB: certain at-risk populations, or depending on work requirements and/or travel history Other . Bone Density Test: recommended for men at age 90 - order has been placed.    Abdominal Aortic Aneurysm: screening with ultrasound recommended once for men age 65-75 who have ever smoked - this was normal in 2010, doesn't need to be repeated.

## 2018-10-29 NOTE — Progress Notes (Signed)
HPI: Patrick Perkins is a 72 y.o. male who  has a past medical history of Diabetes mellitus and ED (erectile dysfunction).  he presents to [facility] today, 10/29/18,  for chief complaint of: Annual Physical  Review labs   Medicare wellness visit w/ Maudie Mercury 08/07/18 about 2 mos ago. No major concerns. Former smoker. Exercises regularly. Hep C screening declined.    Patient here for annual physical / wellness exam.  See preventive care reviewed as below.  Recent labs reviewed in detail with the patient.   A1C is ok at 6.6  Cholesterol not quite at LDL goal 70  Additional concerns today include:  None   Immunization History  Administered Date(s) Administered  . H1N1 12/14/2008  . Influenza Split 09/04/2011, 09/03/2012  . Influenza Whole 09/06/2009, 09/20/2010, 08/20/2013  . Influenza, High Dose Seasonal PF 09/18/2017  . Influenza,inj,Quad PF,6+ Mos 09/10/2018  . Influenza-Unspecified 08/20/2014, 07/22/2015, 09/03/2016  . Pneumococcal Conjugate-13 04/23/2015  . Pneumococcal Polysaccharide-23 09/20/2010, 04/03/2017  . Td 12/14/2008  . Tdap 10/29/2018  . Zoster 02/29/2012  . Zoster Recombinat (Shingrix) 04/07/2017      Past medical, surgical, social and family history reviewed:  Patient Active Problem List   Diagnosis Date Noted  . Neuroforaminal stenosis of cervical spine 04/26/2018  . Degenerative disc disease, cervical 04/26/2018  . Left hip pain 04/26/2018  . Cervical radiculitis 07/09/2014  . Ganglion cyst 03/23/2014  . Encounter for monitoring statin therapy 02/24/2013  . Benign essential tremor 08/31/2011  . Hyperlipemia 08/31/2011  . Type 2 diabetes mellitus ([place]) 01/07/2010  . COLONIC POLYPS 01/01/2009  . DIVERTICULOSIS OF COLON 01/01/2009  . Former smoker 12/14/2008    Past Surgical History:  Procedure Laterality Date  . HERNIA REPAIR  2005   double    Social History   Tobacco Use  . Smoking status: Former  Smoker    Last attempt to quit: 09/30/2009    Years since quitting: 9.0  . Smokeless tobacco: Never Used  Substance Use Topics  . Alcohol use: Yes    Alcohol/week: 1.0 standard drinks    Types: 1 Cans of beer per week    Comment: occasionally    Family History  Problem Relation Age of Onset  . Cancer Father 32       colon     Current medication list and allergy/intolerance information reviewed:    Current Outpatient Medications  Medication Sig Dispense Refill  . AMBULATORY NON FORMULARY MEDICATION One Touch Verio glucose monitor.  Check blood sugar daily. Dx type 2 diabetes. E11.9 1 Units 0  . AMBULATORY NON FORMULARY MEDICATION One Touch Verio glucose monitor test strips and lancets.  Check blood sugar daily. Dx type 2 diabetes. E11.9 50 Units 11  . aspirin 81 MG tablet Take 81 mg by mouth daily.      Marland Kitchen glucose blood (ACCU-CHEK AVIVA) test strip Test blood sugar twice daily. Dx: E11.9 60 each 12  . lovastatin (MEVACOR) 20 MG tablet TAKE ONE TABLET BY MOUTH AT BEDTIME 90 tablet 3  . Multiple Vitamins-Minerals (MULTIVITAMIN PO) Take by mouth.    . primidone (MYSOLINE) 50 MG tablet Take 1 tablet (50 mg total) 3 (three) times daily by mouth. (Patient taking differently: Take 50 mg by mouth 4 (four) times daily. ) 270 tablet 0   No current facility-administered medications for this visit.     No Known Allergies    Review of Systems:  Constitutional:  No  fever, no chills, No recent  illness, No unintentional weight changes. No significant fatigue.   HEENT: No  headache, no vision change, no hearing change, No sore throat, No  sinus pressure  Cardiac: No  chest pain, No  pressure, No palpitations, No  Orthopnea  Respiratory:  No  shortness of breath. No  Cough  Gastrointestinal: No  abdominal pain, No  nausea, No  vomiting,  No  blood in stool, No  diarrhea, No  constipation   Musculoskeletal: No new myalgia/arthralgia  Skin: No  Rash, No other wounds/concerning  lesions  Genitourinary: No  incontinence, No  abnormal genital bleeding, No abnormal genital discharge  Hem/Onc: No  easy bruising/bleeding  Endocrine: No cold intolerance,  No heat intolerance. No polyuria/polydipsia/polyphagia   Neurologic: No  weakness, No  dizziness, No  slurred speech/focal weakness/facial droop. Following with neurology for tremor   Psychiatric: No  concerns with depression, No  concerns with anxiety   Exam:  BP (!) 143/71 (BP Location: Left Arm, Patient Position: Sitting, Cuff Size: Normal)   Pulse 68   Temp 98.1 F (36.7 C) (Oral)   Wt 184 lb (83.5 kg)   BMI 26.03 kg/m   Constitutional: VS see above. General Appearance: alert, well-developed, well-nourished, NAD  Eyes: Normal lids and conjunctive, non-icteric sclera  Ears, Nose, Mouth, Throat: MMM, Normal external inspection ears/nares/mouth/lips/gums. TM normal bilaterally. Pharynx/tonsils no erythema, no exudate. Nasal mucosa normal.   Neck: No masses, trachea midline. No thyroid enlargement. No tenderness/mass appreciated. No lymphadenopathy  Respiratory: Normal respiratory effort. no wheeze, no rhonchi, no rales  Cardiovascular: S1/S2 normal, no murmur, no rub/gallop auscultated. RRR. No lower extremity edema.   Gastrointestinal: Nontender, no masses. No hepatomegaly, no splenomegaly. No hernia appreciated. Bowel sounds normal. Rectal exam deferred.   Musculoskeletal: Gait normal. No clubbing/cyanosis of digits.   Neurological: Normal balance/coordination. +head tremor. No cranial nerve deficit on limited exam. Motor and sensation intact and symmetric. Cerebellar reflexes intact.   Skin: warm, dry, intact. No rash/ulcer. No concerning nevi or subq nodules on limited exam.    Psychiatric: Normal judgment/insight. Normal mood and affect. Oriented x3.   BP Readings from Last 3 Encounters:  10/29/18 (!) 143/71  09/10/18 (!) 144/77  08/07/18 122/82    Results for orders placed or performed in  visit on 10/29/18 (from the past 72 hour(s))  POCT UA - Microalbumin     Status: None   Collection Time: 10/29/18  9:34 AM  Result Value Ref Range   Microalbumin Ur, POC 30 mg/L   Creatinine, POC 300 mg/dL   Albumin/Creatinine Ratio, Urine, POC Normal     Comment: <30 mg/g  POCT HgB A1C     Status: Abnormal   Collection Time: 10/29/18  9:35 AM  Result Value Ref Range   Hemoglobin A1C 6.6 (A) 4.0 - 5.6 %   HbA1c POC (<> result, manual entry)     HbA1c, POC (prediabetic range)     HbA1c, POC (controlled diabetic range)      No results found.      ASSESSMENT/PLAN: The primary encounter diagnosis was Annual physical exam. Diagnoses of Type 2 diabetes mellitus without complication, without long-term current use of insulin ([place]), Need for Tdap vaccination, Degenerative disc disease, cervical, Bone disorder, Benign neoplasm of colon, unspecified part of colon, Hyperlipidemia, unspecified hyperlipidemia type, and Benign essential tremor were also pertinent to this visit.   Orders Placed This Encounter  Procedures  . DG Bone Density  . Tdap vaccine greater than or equal to 7yo IM  .  POCT HgB A1C  . POCT UA - Microalbumin    Meds ordered this encounter  Medications  . DISCONTD: Zoster Vaccine Adjuvanted [facility]) injection    Sig: Inject 0.5 mLs into the muscle once for 1 dose. Fax documentation of vaccine to Dr Redgie Grayer office [phone]    Dispense:  0.5 mL    Refill:  1  . lovastatin (MEVACOR) 40 MG tablet    Sig: Take 1 tablet (40 mg total) by mouth at bedtime.    Dispense:  90 tablet    Refill:  3    Patient Instructions  General Preventive Care  Most recent routine screening lipids/other labs: already done!   Sugars are well-controlled but up from previous, let's recheck in 3 months and if still higher than desired, may consider starting medication.   Cholesterol not quite at goal: increase lovastatin from 20 mg to 40 mg, I sent new prescription for when you're  out of the 20 mg pills.   Blood pressure was borderline today but not too elevated.   Tobacco: don't!   Alcohol: responsible moderation is ok for most adults - if you have concerns about your alcohol intake, please talk to me!   Exercise: as tolerated to reduce risk of cardiovascular disease and diabetes. Strength training will also prevent osteoporosis.   Mental health: if need for mental health care (medicines, counseling, other), or concerns about moods, please let me know!   Sexual health: if ever need for STD testing, or if concerns with libido/pain problems, please let me know!   Advanced Directive: Living Will and/or Healthcare Power of Attorney recommended for all adults, regardless of age or health.  Vaccines  Flu vaccine: recommended for almost everyone, every fall. Thanks for getting this!   Shingles vaccine: You have had the shots but we just need a record of the second one. We will contact [facility].   Pneumonia vaccines: all done!  Tetanus booster: Tdap recommended every 10 years - done today, 10/29/18  Cancer screenings   Colon cancer screening: will arrange follow-up with GI to discuss repeat screening.   Prostate cancer screening: PSA blood test annually age 75-70  Lung cancer screening: scheduled!  Infection screenings . HIV & Gonorrhea/Chlamydia: screening as needed . Hepatitis C: recommended once for anyone born 86-1964.  . TB: certain at-risk populations, or depending on work requirements and/or travel history Other . Bone Density Test: recommended for men at age 71 - order has been placed.   Abdominal Aortic Aneurysm: screening with ultrasound recommended once for men age 45-75 who have ever smoked - this was normal in 2010, doesn't need to be repeated.        Visit summary with medication list and pertinent instructions was printed for patient to review. All questions at time of visit were answered - patient instructed to contact  office with any additional concerns or updates. ER/RTC precautions were reviewed with the patient.     Please note: voice recognition software was used to produce this document, and typos may escape review. Please contact Dr. Sheppard Coil for any needed clarifications.     Follow-up plan: Return in about 3 months (around 01/28/2019) for recheck A1C and cholesterol - early morning appointment and please come fasting.

## 2018-11-01 ENCOUNTER — Ambulatory Visit: Payer: Medicare Other

## 2018-11-01 DIAGNOSIS — Z87891 Personal history of nicotine dependence: Secondary | ICD-10-CM

## 2018-11-01 DIAGNOSIS — Z122 Encounter for screening for malignant neoplasm of respiratory organs: Secondary | ICD-10-CM

## 2018-11-08 ENCOUNTER — Other Ambulatory Visit: Payer: Self-pay | Admitting: Acute Care

## 2018-11-08 DIAGNOSIS — Z87891 Personal history of nicotine dependence: Secondary | ICD-10-CM

## 2018-11-08 DIAGNOSIS — Z122 Encounter for screening for malignant neoplasm of respiratory organs: Secondary | ICD-10-CM

## 2018-12-04 ENCOUNTER — Ambulatory Visit (INDEPENDENT_AMBULATORY_CARE_PROVIDER_SITE_OTHER): Payer: Medicare Other

## 2018-12-04 DIAGNOSIS — M5030 Other cervical disc degeneration, unspecified cervical region: Secondary | ICD-10-CM | POA: Diagnosis not present

## 2018-12-10 LAB — HM DIABETES EYE EXAM

## 2018-12-19 ENCOUNTER — Encounter: Payer: Self-pay | Admitting: Osteopathic Medicine

## 2019-01-14 ENCOUNTER — Encounter: Payer: Self-pay | Admitting: Family Medicine

## 2019-01-14 ENCOUNTER — Ambulatory Visit (INDEPENDENT_AMBULATORY_CARE_PROVIDER_SITE_OTHER): Payer: Medicare Other | Admitting: Family Medicine

## 2019-01-14 ENCOUNTER — Ambulatory Visit (INDEPENDENT_AMBULATORY_CARE_PROVIDER_SITE_OTHER): Payer: Medicare Other

## 2019-01-14 VITALS — BP 159/93 | HR 71 | Temp 97.9°F | Wt 186.0 lb

## 2019-01-14 DIAGNOSIS — R0989 Other specified symptoms and signs involving the circulatory and respiratory systems: Secondary | ICD-10-CM

## 2019-01-14 DIAGNOSIS — R05 Cough: Secondary | ICD-10-CM

## 2019-01-14 DIAGNOSIS — Z1159 Encounter for screening for other viral diseases: Secondary | ICD-10-CM

## 2019-01-14 DIAGNOSIS — R5383 Other fatigue: Secondary | ICD-10-CM | POA: Diagnosis not present

## 2019-01-14 DIAGNOSIS — J3489 Other specified disorders of nose and nasal sinuses: Secondary | ICD-10-CM | POA: Diagnosis not present

## 2019-01-14 MED ORDER — AZELASTINE HCL 0.1 % NA SOLN: 2.0000 | Freq: Two times a day (BID) | NASAL | 12 refills | Status: DC

## 2019-01-14 MED ORDER — PREDNISONE 10 MG PO TABS: 30.0000 mg | ORAL_TABLET | Freq: Every day | ORAL | 0 refills | Status: DC

## 2019-01-14 NOTE — Patient Instructions (Addendum)
Thank you for coming in today. Get labs and xray now.  Use the nasal spray as needed.  Use daily zyrtec, or calritin or allerga.  If not improving fill and take the short course of prednisone.   Let me know if not improving.  We can proceed to next steps.    Sinusitis, Adult Sinusitis is inflammation of your sinuses. Sinuses are hollow spaces in the bones around your face. Your sinuses are located:  Around your eyes.  In the middle of your forehead.  Behind your nose.  In your cheekbones. Mucus normally drains out of your sinuses. When your nasal tissues become inflamed or swollen, mucus can become trapped or blocked. This allows bacteria, viruses, and fungi to grow, which leads to infection. Most infections of the sinuses are caused by a virus. Sinusitis can develop quickly. It can last for up to 4 weeks (acute) or for more than 12 weeks (chronic). Sinusitis often develops after a cold. What are the causes? This condition is caused by anything that creates swelling in the sinuses or stops mucus from draining. This includes:  Allergies.  Asthma.  Infection from bacteria or viruses.  Deformities or blockages in your nose or sinuses.  Abnormal growths in the nose (nasal polyps).  Pollutants, such as chemicals or irritants in the air.  Infection from fungi (rare). What increases the risk? You are more likely to develop this condition if you:  Have a weak body defense system (immune system).  Do a lot of swimming or diving.  Overuse nasal sprays.  Smoke. What are the signs or symptoms? The main symptoms of this condition are pain and a feeling of pressure around the affected sinuses. Other symptoms include:  Stuffy nose or congestion.  Thick drainage from your nose.  Swelling and warmth over the affected sinuses.  Headache.  Upper toothache.  A cough that may get worse at night.  Extra mucus that collects in the throat or the back of the nose (postnasal  drip).  Decreased sense of smell and taste.  Fatigue.  A fever.  Sore throat.  Bad breath. How is this diagnosed? This condition is diagnosed based on:  Your symptoms.  Your medical history.  A physical exam.  Tests to find out if your condition is acute or chronic. This may include: ? Checking your nose for nasal polyps. ? Viewing your sinuses using a device that has a light (endoscope). ? Testing for allergies or bacteria. ? Imaging tests, such as an MRI or CT scan. In rare cases, a bone biopsy may be done to rule out more serious types of fungal sinus disease. How is this treated? Treatment for sinusitis depends on the cause and whether your condition is chronic or acute.  If caused by a virus, your symptoms should go away on their own within 10 days. You may be given medicines to relieve symptoms. They include: ? Medicines that shrink swollen nasal passages (topical intranasal decongestants). ? Medicines that treat allergies (antihistamines). ? A spray that eases inflammation of the nostrils (topical intranasal corticosteroids). ? Rinses that help get rid of thick mucus in your nose (nasal saline washes).  If caused by bacteria, your health care provider may recommend waiting to see if your symptoms improve. Most bacterial infections will get better without antibiotic medicine. You may be given antibiotics if you have: ? A severe infection. ? A weak immune system.  If caused by narrow nasal passages or nasal polyps, you may need to have  surgery. Follow these instructions at home: Medicines  Take, use, or apply over-the-counter and prescription medicines only as told by your health care provider. These may include nasal sprays.  If you were prescribed an antibiotic medicine, take it as told by your health care provider. Do not stop taking the antibiotic even if you start to feel better. Hydrate and humidify   Drink enough fluid to keep your urine pale yellow.  Staying hydrated will help to thin your mucus.  Use a cool mist humidifier to keep the humidity level in your home above 50%.  Inhale steam for 10-15 minutes, 3-4 times a day, or as told by your health care provider. You can do this in the bathroom while a hot shower is running.  Limit your exposure to cool or dry air. Rest  Rest as much as possible.  Sleep with your head raised (elevated).  Make sure you get enough sleep each night. General instructions   Apply a warm, moist washcloth to your face 3-4 times a day or as told by your health care provider. This will help with discomfort.  Wash your hands often with soap and water to reduce your exposure to germs. If soap and water are not available, use hand sanitizer.  Do not smoke. Avoid being around people who are smoking (secondhand smoke).  Keep all follow-up visits as told by your health care provider. This is important. Contact a health care provider if:  You have a fever.  Your symptoms get worse.  Your symptoms do not improve within 10 days. Get help right away if:  You have a severe headache.  You have persistent vomiting.  You have severe pain or swelling around your face or eyes.  You have vision problems.  You develop confusion.  Your neck is stiff.  You have trouble breathing. Summary  Sinusitis is soreness and inflammation of your sinuses. Sinuses are hollow spaces in the bones around your face.  This condition is caused by nasal tissues that become inflamed or swollen. The swelling traps or blocks the flow of mucus. This allows bacteria, viruses, and fungi to grow, which leads to infection.  If you were prescribed an antibiotic medicine, take it as told by your health care provider. Do not stop taking the antibiotic even if you start to feel better.  Keep all follow-up visits as told by your health care provider. This is important. This information is not intended to replace advice given to you by  your health care provider. Make sure you discuss any questions you have with your health care provider. Document Released: 11/06/2005 Document Revised: 04/08/2018 Document Reviewed: 04/08/2018 Elsevier Interactive Patient Education  2019 [employer].

## 2019-01-14 NOTE — Progress Notes (Signed)
Patrick Perkins is a 73 y.o. male who presents to [facility] today for sinus congestion drainage fatigue and mild sore throat.  Symptoms present off and on for about a month.  He is tried some over-the-counter medications including Tylenol DayQuil and NyQuil which helps some.  He notes a mild cough but denies significant shortness of breath chest pain or palpitations.  He denies any new medications.   ROS as above:  Exam:  BP (!) 159/93   Pulse 71   Temp 97.9 F (36.6 C) (Oral)   Wt 186 lb (84.4 kg)   BMI 26.31 kg/m  Wt Readings from Last 5 Encounters:  01/14/19 186 lb (84.4 kg)  10/29/18 184 lb (83.5 kg)  09/10/18 180 lb (81.6 kg)  08/07/18 180 lb (81.6 kg)  07/30/18 179 lb 1.6 oz (81.2 kg)    Gen: Well NAD HEENT: EOMI,  MMM no significant cervical lymphadenopathy or neck masses.  Posterior pharynx with mild cobblestoning.  Clear nasal discharge without significant inflamed nasal turbinates.  Normal tympanic membranes bilaterally. Lungs: Normal work of breathing. CTABL Heart: RRR no MRG Abd: NABS, Soft. Nondistended, Nontender Exts: Brisk capillary refill, warm and well perfused.   Lab and Radiology Results Two-view chest x-ray images personally independently reviewed Bronchitic changes present. Possible Atelectasis present left lower lung field. No acute infiltrates.  Await formal radiology review   Assessment and Plan: 73 y.o. male with cough congestion and fatigue.  Etiology somewhat unclear.  Likely viral etiology versus some fundamental underlying issue.  Plan for basic lab below.  Additionally will obtain chest x-ray.  Will treat empirically with Astelin nasal spray and over-the-counter medications including antihistamines.  Backup prednisone for use if not improving.  PDMP not reviewed this encounter. Orders Placed This Encounter  Procedures    . DG Chest 2 View    Order Specific Question:   Reason for exam:    Answer:   Cough, assess intra-thoracic pathology    Order Specific Question:   Preferred imaging location?    Answer:   [facility]  . CBC with Differential/Platelet  . COMPLETE METABOLIC PANEL WITH GFR  . TSH  . Hepatitis C antibody   Meds ordered this encounter  Medications  . azelastine (ASTELIN) 0.1 % nasal spray    Sig: Place 2 sprays into both nostrils 2 (two) times daily. Use in each nostril as directed    Dispense:  30 mL    Refill:  12  . predniSONE (DELTASONE) 10 MG tablet    Sig: Take 3 tablets (30 mg total) by mouth daily with breakfast.    Dispense:  15 tablet    Refill:  0     Historical information moved to improve visibility of documentation.  Past Medical History:  Diagnosis Date  . Diabetes mellitus   . ED (erectile dysfunction)    Past Surgical History:  Procedure Laterality Date  . HERNIA REPAIR  2005   double   Social History   Tobacco Use  . Smoking status: Former Smoker    Last attempt to quit: 09/30/2009    Years since quitting: 9.2  . Smokeless tobacco: Never Used  Substance Use Topics  . Alcohol use: Yes    Alcohol/week: 1.0 standard drinks    Types: 1 Cans of beer per week    Comment: occasionally   family history includes Cancer (age of onset: 60) in his father.  Medications: Current Outpatient  Medications  Medication Sig Dispense Refill  . AMBULATORY NON FORMULARY MEDICATION One Touch Verio glucose monitor.  Check blood sugar daily. Dx type 2 diabetes. E11.9 1 Units 0  . AMBULATORY NON FORMULARY MEDICATION One Touch Verio glucose monitor test strips and lancets.  Check blood sugar daily. Dx type 2 diabetes. E11.9 50 Units 11  . aspirin 81 MG tablet Take 81 mg by mouth daily.      Marland Kitchen glucose blood (ACCU-CHEK AVIVA) test strip Test blood sugar twice daily. Dx: E11.9 60 each 12  . lovastatin (MEVACOR) 40 MG tablet Take 1 tablet (40 mg total) by mouth at  bedtime. 90 tablet 3  . Multiple Vitamins-Minerals (MULTIVITAMIN PO) Take by mouth.    . primidone (MYSOLINE) 50 MG tablet Take 1 tablet (50 mg total) 3 (three) times daily by mouth. (Patient taking differently: Take 50 mg by mouth 4 (four) times daily. ) 270 tablet 0  . azelastine (ASTELIN) 0.1 % nasal spray Place 2 sprays into both nostrils 2 (two) times daily. Use in each nostril as directed 30 mL 12  . predniSONE (DELTASONE) 10 MG tablet Take 3 tablets (30 mg total) by mouth daily with breakfast. 15 tablet 0   No current facility-administered medications for this visit.    No Known Allergies   Discussed warning signs or symptoms. Please see discharge instructions. Patient expresses understanding.

## 2019-01-15 LAB — HEPATITIS C ANTIBODY
Hepatitis C Ab: NONREACTIVE
SIGNAL TO CUT-OFF: 0.01 (ref <1.00)

## 2019-01-15 LAB — COMPLETE METABOLIC PANEL WITH GFR
AG Ratio: 1.9 (calc) (ref 1.0–2.5)
ALT: 27 U/L (ref 9–46)
AST: 23 U/L (ref 10–35)
Albumin: 4.9 g/dL (ref 3.6–5.1)
Alkaline phosphatase (APISO): 88 U/L (ref 35–144)
BUN: 12 mg/dL (ref 7–25)
CO2: 29 mmol/L (ref 20–32)
Calcium: 9.9 mg/dL (ref 8.6–10.3)
Chloride: 102 mmol/L (ref 98–110)
Creat: 0.97 mg/dL (ref 0.70–1.18)
GFR, Est African American: 91 mL/min/{1.73_m2} (ref >=60)
GFR, Est Non African American: 78 mL/min/{1.73_m2} (ref >=60)
GLOBULIN: 2.6 g/dL (ref 1.9–3.7)
Glucose, Bld: 114 mg/dL — ABNORMAL HIGH (ref 65–99)
POTASSIUM: 4.9 mmol/L (ref 3.5–5.3)
Sodium: 142 mmol/L (ref 135–146)
Total Bilirubin: 0.4 mg/dL (ref 0.2–1.2)
Total Protein: 7.5 g/dL (ref 6.1–8.1)

## 2019-01-15 LAB — CBC WITH DIFFERENTIAL/PLATELET
ABSOLUTE MONOCYTES: 1053 {cells}/uL — AB (ref 200–950)
Basophils Absolute: 49 cells/uL (ref 0–200)
Basophils Relative: 0.6 %
Eosinophils Absolute: 219 cells/uL (ref 15–500)
Eosinophils Relative: 2.7 %
HCT: 40.7 % (ref 38.5–50.0)
Hemoglobin: 14.7 g/dL (ref 13.2–17.1)
LYMPHS ABS: 2414 {cells}/uL (ref 850–3900)
MCH: 33.9 pg — ABNORMAL HIGH (ref 27.0–33.0)
MCHC: 36.1 g/dL — ABNORMAL HIGH (ref 32.0–36.0)
MCV: 94 fL (ref 80.0–100.0)
MPV: 11.4 fL (ref 7.5–12.5)
Monocytes Relative: 13 %
Neutro Abs: 4366 cells/uL (ref 1500–7800)
Neutrophils Relative %: 53.9 %
Platelets: 309 10*3/uL (ref 140–400)
RBC: 4.33 10*6/uL (ref 4.20–5.80)
RDW: 12.9 % (ref 11.0–15.0)
Total Lymphocyte: 29.8 %
WBC: 8.1 10*3/uL (ref 3.8–10.8)

## 2019-01-15 LAB — TSH: TSH: 1.59 m[IU]/L (ref 0.40–4.50)

## 2019-01-29 ENCOUNTER — Encounter: Payer: Self-pay | Admitting: Osteopathic Medicine

## 2019-01-29 ENCOUNTER — Ambulatory Visit (INDEPENDENT_AMBULATORY_CARE_PROVIDER_SITE_OTHER): Payer: Medicare Other | Admitting: Osteopathic Medicine

## 2019-01-29 ENCOUNTER — Other Ambulatory Visit: Payer: Self-pay

## 2019-01-29 VITALS — BP 143/70 | HR 72 | Temp 98.1°F | Wt 184.5 lb

## 2019-01-29 DIAGNOSIS — J3489 Other specified disorders of nose and nasal sinuses: Secondary | ICD-10-CM

## 2019-01-29 DIAGNOSIS — E119 Type 2 diabetes mellitus without complications: Secondary | ICD-10-CM | POA: Diagnosis not present

## 2019-01-29 DIAGNOSIS — R5382 Chronic fatigue, unspecified: Secondary | ICD-10-CM | POA: Diagnosis not present

## 2019-01-29 LAB — POCT GLYCOSYLATED HEMOGLOBIN (HGB A1C): Hemoglobin A1C: 7.1 % — AB (ref 4.0–5.6)

## 2019-01-29 MED ORDER — LOVASTATIN 40 MG PO TABS: 20.0000 mg | ORAL_TABLET | Freq: Every day | ORAL | 3 refills | Status: DC

## 2019-01-29 MED ORDER — METFORMIN HCL ER 500 MG PO TB24: 500.0000 mg | ORAL_TABLET | Freq: Every day | ORAL | 3 refills | Status: DC

## 2019-01-29 NOTE — Patient Instructions (Addendum)
Plan:  For the hoarseness, can continue over-the-counter/prescription allergy treatments as previously directed, we can go ahead and decrease the lovastatin from 40 mg down to 20 mg for a few weeks and see if this makes any difference.  If you are feeling better, great.  If not, let me know and we can send in a referral to ear nose and throat specialist  Since the sugars are creeping up on [place] a bit (A1c went from 6.2 about 6 months ago to 7.1 now) let's restart metformin at 500 mg once per day.  Work on exercising as tolerated and low carbohydrate diet.

## 2019-01-29 NOTE — Progress Notes (Signed)
HPI: Patrick Perkins is a 73 y.o. male who  has a past medical history of Diabetes mellitus and ED (erectile dysfunction).  he presents to [facility] today, 01/29/19,  for chief complaint of:  DM2 follow-up Fatigue Nasal congestion, sore throat   DM2: Sugars have been in prediabetic range:  07/2018: 6.2% 10/2018: 6.6% Today 01/2019: 7.1% Patient has not been very diligent about diet/exercise, he is feeling a bit fatigued and rundown from the sinus issues, see below.  Also weather has not been good so he has not been walking very much.  Previously on metformin, we stopped this at patient request when he wanted to just cut back on medications and sugars were in prediabetic range.  He states fasting sugars have typically been in the 150s  Sinus: Patient has noted on and off nasal congestion/drainage, first discussed this with me back in September, about 6 months ago.  Given recent viral illness and sinus congestion, opted to trial Augmentin for possible sinusitis as well as add Flonase for likely underlying chronic rhinitis.  Patient saw a colleague of mine in the office about a month ago for similar complaint ongoing for about a month.  The Patrick Perkins started azelastine nasal spray and gave patient prednisone burst as backup plan, also encouraged consistent use of over-the-counter antihistamines.  Patient is still complaining of sinus congestion, hoarseness/sore throat.  He is concerned that the increased dose of lovastatin may be a cause of this and would like to try reducing this medication.     At today's visit 01/29/19 ... PMH, PSH, FH reviewed and updated as needed.  Current medication list and allergy/intolerance hx reviewed and updated as needed. (See remainder of HPI, ROS, Phys Exam below)           ASSESSMENT/PLAN: The primary encounter diagnosis was Type 2 diabetes mellitus without complication, without long-term current use of insulin  ([place]). Diagnoses of Nasal drainage and Chronic fatigue were also pertinent to this visit.   Orders Placed This Encounter  Procedures  . POCT HgB A1C     Meds ordered this encounter  Medications  . metFORMIN (GLUCOPHAGE XR) 500 MG 24 hr tablet    Sig: Take 1 tablet (500 mg total) by mouth daily with breakfast.    Dispense:  90 tablet    Refill:  3  . lovastatin (MEVACOR) 40 MG tablet    Sig: Take 0.5 tablets (20 mg total) by mouth at bedtime.    Dispense:  90 tablet    Refill:  3    Patient Instructions  Plan:  For the hoarseness, can continue over-the-counter/prescription allergy treatments as previously directed, we can go ahead and decrease the lovastatin from 40 mg down to 20 mg for a few weeks and see if this makes any difference.  If you are feeling better, great.  If not, let me know and we can send in a referral to ear nose and throat specialist  Since the sugars are creeping up on [place] a bit (A1c went from 6.2 about 6 months ago to 7.1 now) let's restart metformin at 500 mg once per day.  Work on exercising as tolerated and low carbohydrate diet.       Follow-up plan: Return in about 3 months (around 05/01/2019) for A1c/diabetes recheck.  Sooner if needed.                                                 ################################################# ################################################# ################################################# #################################################  Current Meds  Medication Sig  . AMBULATORY NON FORMULARY MEDICATION One Touch Verio glucose monitor.  Check blood sugar daily. Dx type 2 diabetes. E11.9  . AMBULATORY NON FORMULARY MEDICATION One Touch Verio glucose monitor test strips and lancets.  Check blood sugar daily. Dx type 2 diabetes. E11.9  . aspirin 81 MG tablet Take 81 mg by mouth daily.    Marland Kitchen azelastine (ASTELIN) 0.1 % nasal spray Place 2 sprays into both  nostrils 2 (two) times daily. Use in each nostril as directed  . glucose blood (ACCU-CHEK AVIVA) test strip Test blood sugar twice daily. Dx: E11.9  . lovastatin (MEVACOR) 40 MG tablet Take 0.5 tablets (20 mg total) by mouth at bedtime.  . Multiple Vitamins-Minerals (MULTIVITAMIN PO) Take by mouth.  . [DISCONTINUED] lovastatin (MEVACOR) 40 MG tablet Take 1 tablet (40 mg total) by mouth at bedtime.  . [DISCONTINUED] predniSONE (DELTASONE) 10 MG tablet Take 3 tablets (30 mg total) by mouth daily with breakfast.  . [DISCONTINUED] primidone (MYSOLINE) 50 MG tablet Take 1 tablet (50 mg total) 3 (three) times daily by mouth. (Patient taking differently: Take 50 mg by mouth 4 (four) times daily. )    No Known Allergies     Review of Systems:  Constitutional: No recent illness  HEENT: No  headache, no vision change  Cardiac: No  chest pain, No  pressure, No palpitations  Respiratory:  No  shortness of breath. No  Cough  Gastrointestinal: No  abdominal pain, no change on bowel habits  Musculoskeletal: No new myalgia/arthralgia  Neurologic: No  weakness, No  Dizziness  Psychiatric: No  concerns with depression, No  concerns with anxiety  Exam:  BP (!) 143/70 (BP Location: Left Arm, Patient Position: Sitting, Cuff Size: Normal)   Pulse 72   Temp 98.1 F (36.7 C) (Oral)   Wt 184 lb 8 oz (83.7 kg)   BMI 26.10 kg/m   Constitutional: VS see above. General Appearance: alert, well-developed, well-nourished, NAD  Eyes: Normal lids and conjunctive, non-icteric sclera  Ears, Nose, Mouth, Throat: MMM, Normal external inspection ears/nares/mouth/lips/gums.  Neck: No masses, trachea midline.   Respiratory: Normal respiratory effort. no wheeze, no rhonchi, no rales  Cardiovascular: S1/S2 normal, no murmur, no rub/gallop auscultated. RRR.   Musculoskeletal: Gait normal. Symmetric and independent movement of all extremities  Neurological: Normal balance/coordination. No tremor.  Skin:  warm, dry, intact.   Psychiatric: Normal judgment/insight. Normal mood and affect. Oriented x3.       Visit summary with medication list and pertinent instructions was printed for patient to review, patient was advised to alert [place] if any updates are needed. All questions at time of visit were answered - patient instructed to contact office with any additional concerns. ER/RTC precautions were reviewed with the patient and understanding verbalized.    Please note: voice recognition software was used to produce this document, and typos may escape review. Please contact Dr. Sheppard Coil for any needed clarifications.    Follow up plan: Return in about 3 months (around 05/01/2019) for A1c/diabetes recheck.  Sooner if needed.

## 2019-05-06 ENCOUNTER — Other Ambulatory Visit: Payer: Self-pay

## 2019-05-06 ENCOUNTER — Encounter: Payer: Self-pay | Admitting: Osteopathic Medicine

## 2019-05-06 ENCOUNTER — Ambulatory Visit (INDEPENDENT_AMBULATORY_CARE_PROVIDER_SITE_OTHER): Payer: Medicare Other | Admitting: Osteopathic Medicine

## 2019-05-06 VITALS — BP 144/79 | HR 64 | Temp 98.1°F | Wt 186.5 lb

## 2019-05-06 DIAGNOSIS — E785 Hyperlipidemia, unspecified: Secondary | ICD-10-CM

## 2019-05-06 DIAGNOSIS — E119 Type 2 diabetes mellitus without complications: Secondary | ICD-10-CM

## 2019-05-06 DIAGNOSIS — Z0000 Encounter for general adult medical examination without abnormal findings: Secondary | ICD-10-CM | POA: Diagnosis not present

## 2019-05-06 DIAGNOSIS — R030 Elevated blood-pressure reading, without diagnosis of hypertension: Secondary | ICD-10-CM

## 2019-05-06 LAB — POCT GLYCOSYLATED HEMOGLOBIN (HGB A1C): Hemoglobin A1C: 6.4 % — AB (ref 4.0–5.6)

## 2019-05-06 NOTE — Progress Notes (Signed)
HPI: Patrick Perkins is a 73 y.o. male who  has a past medical history of Diabetes mellitus and ED (erectile dysfunction).  he presents to [facility] today, 05/06/19,  for chief complaint of:  DM2 follow-up   DM2: Sugars have been in prediabetic range but bumped up a bit over the past year:  07/2018: 6.2% 10/2018: 6.6% 01/2019: 7.1% Today 04/2019: 6.4% yay!  Reports doing really well over the past couple months, no concenrs.   HTN: BP systolic a bit above goal, pt not on antihypertensives, no CP/SOB, no HA/VC    At today's visit 05/06/19 ... PMH, PSH, FH reviewed and updated as needed.  Current medication list and allergy/intolerance hx reviewed and updated as needed. (See remainder of HPI, ROS, Phys Exam below)   No results found.  Results for orders placed or performed in visit on 05/06/19 (from the past 72 hour(s))  POCT HgB A1C     Status: Abnormal   Collection Time: 05/06/19  9:14 AM  Result Value Ref Range   Hemoglobin A1C 6.4 (A) 4.0 - 5.6 %   HbA1c POC (<> result, manual entry)     HbA1c, POC (prediabetic range)     HbA1c, POC (controlled diabetic range)            ASSESSMENT/PLAN: The primary encounter diagnosis was Type 2 diabetes mellitus without complication, without long-term current use of insulin ([place]). Diagnoses of Annual physical exam, Hyperlipidemia, unspecified hyperlipidemia type, and Elevated blood pressure reading were also pertinent to this visit.   Labs ordered for future visit. Annual physical / preventive care was NOT performed or billed today.   Pt to monitor home BP and let me know if consistently higher than 140 top number.    Orders Placed This Encounter  Procedures  . CBC  . COMPLETE METABOLIC PANEL WITH GFR  . Lipid panel  . Hemoglobin A1c  . POCT HgB A1C        Follow-up plan: Return in about 4 months (around 09/05/2019) for A1C recheck /annual physical with Dr Sheppard Coil (Medicare  check w/ Maudie Mercury 08/12/19, will get labs). Orders are in!                                                  ################################################# ################################################# ################################################# #################################################    Current Meds  Medication Sig  . AMBULATORY NON FORMULARY MEDICATION One Touch Verio glucose monitor test strips and lancets.  Check blood sugar daily. Dx type 2 diabetes. E11.9  . aspirin 81 MG tablet Take 81 mg by mouth daily.    Marland Kitchen azelastine (ASTELIN) 0.1 % nasal spray Place 2 sprays into both nostrils 2 (two) times daily. Use in each nostril as directed  . glucose blood (ACCU-CHEK AVIVA) test strip Test blood sugar twice daily. Dx: E11.9  . lovastatin (MEVACOR) 40 MG tablet Take 0.5 tablets (20 mg total) by mouth at bedtime.  . metFORMIN (GLUCOPHAGE XR) 500 MG 24 hr tablet Take 1 tablet (500 mg total) by mouth daily with breakfast.  . Multiple Vitamins-Minerals (MULTIVITAMIN PO) Take by mouth.    No Known Allergies     Review of Systems:  Constitutional: No recent illness  HEENT: No  headache, no vision change  Cardiac: No  chest pain, No  pressure, No palpitations  Respiratory:  No  shortness of  breath. No  Cough  Musculoskeletal: No new myalgia/arthralgia  Neurologic: No  weakness, No  Dizziness   Exam:  BP (!) 144/79 (BP Location: Left Arm, Patient Position: Sitting, Cuff Size: Normal)   Pulse 64   Temp 98.1 F (36.7 C) (Oral)   Wt 186 lb 8 oz (84.6 kg)   BMI 26.38 kg/m   BP Readings from Last 3 Encounters:  05/06/19 (!) 144/79  01/29/19 (!) 143/70  01/14/19 (!) 159/93    Constitutional: VS see above. General Appearance: alert, well-developed, well-nourished, NAD  Eyes: Normal lids and conjunctive, non-icteric sclera  Ears, Nose, Mouth, Throat: MMM, Normal external inspection  ears/nares/mouth/lips/gums.  Neck: No masses, trachea midline.   Respiratory: Normal respiratory effort. no wheeze, no rhonchi, no rales  Cardiovascular: S1/S2 normal, no murmur, no rub/gallop auscultated. RRR.   Musculoskeletal: Gait normal. Symmetric and independent movement of all extremities  Neurological: Normal balance/coordination. No tremor.  Skin: warm, dry, intact.   Psychiatric: Normal judgment/insight. Normal mood and affect. Oriented x3.       Visit summary with medication list and pertinent instructions was printed for patient to review, patient was advised to alert [place] if any updates are needed. All questions at time of visit were answered - patient instructed to contact office with any additional concerns. ER/RTC precautions were reviewed with the patient and understanding verbalized.   Note: Total time spent 25 minutes, greater than 50% of the visit was spent face-to-face counseling and coordinating care for the following: The primary encounter diagnosis was Type 2 diabetes mellitus without complication, without long-term current use of insulin ([place]). Diagnoses of elevated BP and Hyperlipidemia, unspecified hyperlipidemia type were also pertinent to this visit.Marland Kitchen  Please note: voice recognition software was used to produce this document, and typos may escape review. Please contact Dr. Sheppard Coil for any needed clarifications.    Follow up plan: Return in about 4 months (around 09/05/2019) for A1C recheck /annual physical with Dr Sheppard Coil (Medicare check w/ Maudie Mercury 08/12/19, will get labs).

## 2019-08-12 ENCOUNTER — Ambulatory Visit: Payer: Medicare Other

## 2019-08-18 NOTE — Progress Notes (Signed)
Subjective:   Patrick Perkins is a 73 y.o. male who presents for Medicare Annual/Subsequent preventive examination.  Review of Systems:  No ROS.  Medicare Wellness Virtual Visit.  Visual/audio telehealth visit, UTA vital signs.   See social history for additional risk factors.    Cardiac Risk Factors include: advanced age (>75men, >51 women);diabetes mellitus;male gender  Sleep patterns: getting 7-8 hours of sleep a night. Normally does not wake up to void. Wakes up and feels refreshed.  Home Safety/Smoke Alarms: Feels safe in home. Smoke alarms in place.  Living environment; Lives with wife Patrick Perkins in a 1 story home. No steps in or around the house. Shower is a step over tub and no grab bars in place. Seat Belt Safety/Bike Helmet: Wears seat belt.    Male:   CCS-  UTD   PSA- declined Lab Results  Component Value Date   PSA 1.32 08/29/2012   PSA 1.69 01/07/2010   PSA 1.30 12/15/2008        Objective:    Vitals: BP (!) 141/75   Pulse 91   Temp 98 F (36.7 C) (Oral)   Ht 5\' 10"  (1.778 m)   Wt 187 lb (84.8 kg)   SpO2 96%   BMI 26.83 kg/m   Body mass index is 26.83 kg/m.  Advanced Directives 08/19/2019 09/19/2018 08/07/2018 05/07/2018 03/10/2015  Does Patient Have a Medical Advance Directive? Yes Yes Yes Yes Yes  Type of Paramedic of [place];Living will [place];Living will Living will Living will [place];Living will  Does patient want to make changes to medical advance directive? No - Patient declined - No - Patient declined - -  Copy of [place] in Chart? No - copy requested No - copy requested - - No - copy requested    Tobacco Social History   Tobacco Use  Smoking Status Former Smoker  . Quit date: 09/30/2009  . Years since quitting: 9.8  Smokeless Tobacco Never Used     Counseling given: Not Answered   Clinical Intake:  Pre-visit preparation completed: Yes  Pain :  No/denies pain     Nutritional Risks: None Diabetes: Yes CBG done?: No(FBS at home 132) Did pt. bring in CBG monitor from home?: No  How often do you need to have someone help you when you read instructions, pamphlets, or other written materials from your doctor or pharmacy?: 1 - Never What is the last grade level you completed in school?: 16  Interpreter Needed?: No  Information entered by :: Orlie Dakin, LPN  Past Medical History:  Diagnosis Date  . Diabetes mellitus   . ED (erectile dysfunction)    Past Surgical History:  Procedure Laterality Date  . HERNIA REPAIR  2005   double   Family History  Problem Relation Age of Onset  . Cancer Father 85       colon   Social History   Socioeconomic History  . Marital status: Married    Spouse name: Patrick Perkins  . Number of children: 1  . Years of education: college  . Highest education level: 12th grade  Occupational History  . 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 Financial resource strain: Not hard at all  . Food insecurity    Worry: Never true    Inability: Never true  . Transportation needs    Medical: No  Non-medical: No  Tobacco Use  . Smoking status: Former Smoker    Quit date: 09/30/2009    Years since quitting: 9.8  . Smokeless tobacco: Never Used  Substance and Sexual Activity  . Alcohol use: Yes    Alcohol/week: 1.0 standard drinks    Types: 1 Cans of beer per week    Comment: occasionally  . Drug use: Never  . Sexual activity: Not Currently  Lifestyle  . Physical activity    Days per week: 0 days    Minutes per session: 0 min  . Stress: Not at all  Relationships  . Social connections    Talks on phone: More than three times a week    Gets together: More than three times a week    Attends religious service: Never    Active member of club or organization: Yes    Attends meetings of clubs or organizations: More than 4 times per year     Relationship status: Married  Other Topics Concern  . Not on file  Social History Narrative   Plays golf twice a week    Outpatient Encounter Medications as of 08/19/2019  Medication Sig  . AMBULATORY NON FORMULARY MEDICATION One Touch Verio glucose monitor test strips and lancets.  Check blood sugar daily. Dx type 2 diabetes. E11.9  . aspirin 81 MG tablet Take 81 mg by mouth daily.    Marland Kitchen glucose blood (ACCU-CHEK AVIVA) test strip Test blood sugar twice daily. Dx: E11.9  . lovastatin (MEVACOR) 40 MG tablet Take 0.5 tablets (20 mg total) by mouth at bedtime.  . metFORMIN (GLUCOPHAGE XR) 500 MG 24 hr tablet Take 1 tablet (500 mg total) by mouth daily with breakfast.  . Multiple Vitamins-Minerals (MULTIVITAMIN PO) Take by mouth.  . primidone (MYSOLINE) 250 MG tablet Take 250 mg by mouth at bedtime.  . primidone (MYSOLINE) 50 MG tablet Take by mouth at bedtime.  . AMBULATORY NON FORMULARY MEDICATION One Touch Verio glucose monitor.  Check blood sugar daily. Dx type 2 diabetes. E11.9  . azelastine (ASTELIN) 0.1 % nasal spray Place 2 sprays into both nostrils 2 (two) times daily. Use in each nostril as directed (Patient not taking: Reported on 08/19/2019)   No facility-administered encounter medications on file as of 08/19/2019.     Activities of Daily Living In your present state of health, do you have any difficulty performing the following activities: 08/19/2019  Hearing? Y  Comment does not hear high pitch tones  Vision? N  Difficulty concentrating or making decisions? N  Walking or climbing stairs? N  Dressing or bathing? N  Doing errands, shopping? N  Preparing Food and eating ? N  Using the Toilet? N  In the past six months, have you accidently leaked urine? N  Do you have problems with loss of bowel control? N  Managing your Medications? N  Managing your Finances? N  Some recent data might be hidden    Patient Care Team: Emeterio Reeve, DO as PCP - General (Osteopathic  Medicine) Orbie Hurst, MD as Referring Physician (Dermatology) Lawrence Marseilles, MD as Referring Physician (Neurology)   Assessment:   This is a routine wellness examination for Patrick Perkins.Physical assessment deferred to PCP.   Exercise Activities and Dietary recommendations Current Exercise Habits: The patient does not participate in regular exercise at present Diet  Eats a fairly healthy diet of vegetables and fruits, on a low carb diet with wife. Breakfast:sausage and eggs or bacon and eggs Lunch: chicken salad or sandwich  Dinner: Counsellor Drinks 32 ounces of water a day if not more than that.      Goals    . DIET - REDUCE SODIUM INTAKE     Decrease salt intake to help keep blood pressure unde control.    . Exercise 150 min/wk Moderate Activity     Continue to exercise as you are doing. Your doing a great job at staying healthy.    . Exercise 3x per week (30 min per time)     Start back exercising like he used to getting more walking in. Would like to loose some weight as well.       Fall Risk Fall Risk  08/19/2019 08/07/2018 10/03/2016  Falls in the past year? 0 No No  Number falls in past yr: 0 - -  Injury with Fall? 0 - -  Follow up Falls prevention discussed - -   Is the patient's home free of loose throw rugs in walkways, pet beds, electrical cords, etc?   yes      Grab bars in the bathroom? no      Handrails on the stairs?   no      Adequate lighting?   yes   Depression Screen PHQ 2/9 Scores 08/19/2019 10/29/2018 08/07/2018 07/30/2018  PHQ - 2 Score 0 0 0 0  PHQ- 9 Score - - 0 1    Cognitive Function     6CIT Screen 08/19/2019 08/07/2018  What Year? 0 points 0 points  What month? 0 points 0 points  What time? 0 points 0 points  Count back from 20 0 points 0 points  Months in reverse 0 points 0 points  Repeat phrase 0 points 0 points  Total Score 0 0    Immunization History  Administered Date(s) Administered  . H1N1 12/14/2008  . Influenza Split  09/04/2011, 09/03/2012  . Influenza Whole 09/06/2009, 09/20/2010, 08/20/2013  . Influenza, High Dose Seasonal PF 09/18/2017  . Influenza,inj,Quad PF,6+ Mos 09/10/2018  . Influenza-Unspecified 08/20/2014, 07/22/2015, 09/03/2016  . Pneumococcal Conjugate-13 04/23/2015  . Pneumococcal Polysaccharide-23 09/20/2010, 04/03/2017  . Td 12/14/2008  . Tdap 10/29/2018  . Zoster 02/29/2012  . Zoster Recombinat (Shingrix) 04/07/2017, 06/21/2017    Screening Tests Health Maintenance  Topic Date Due  . FOOT EXAM  10/03/2017  . INFLUENZA VACCINE  06/21/2019  . URINE MICROALBUMIN  10/30/2019  . HEMOGLOBIN A1C  11/05/2019  . OPHTHALMOLOGY EXAM  12/11/2019  . COLONOSCOPY  01/22/2024  . TETANUS/TDAP  10/29/2028  . Hepatitis C Screening  Completed  . PNA vac Low Risk Adult  Completed        Plan:    Please schedule your next medicare wellness visit with me in 1 yr.  Patrick Perkins , Thank you for taking time to come for your Medicare Wellness Visit. I appreciate your ongoing commitment to your health goals. Please review the following plan we discussed and let me know if I can assist you in the future.   These are the goals we discussed: Goals    . DIET - REDUCE SODIUM INTAKE     Decrease salt intake to help keep blood pressure unde control.    . Exercise 150 min/wk Moderate Activity     Continue to exercise as you are doing. Your doing a great job at staying healthy.    . Exercise 3x per week (30 min per time)     Start back exercising like he used to getting more walking in. Would like  to loose some weight as well.       This is a list of the screening recommended for you and due dates:  Health Maintenance  Topic Date Due  . Complete foot exam   10/03/2017  . Flu Shot  06/21/2019  . Urine Protein Check  10/30/2019  . Hemoglobin A1C  11/05/2019  . Eye exam for diabetics  12/11/2019  . Colon Cancer Screening  01/22/2024  . Tetanus Vaccine  10/29/2028  .  Hepatitis C: One time  screening is recommended by Center for Disease Control  (CDC) for  adults born from [birth year] through 1964.   Completed  . Pneumonia vaccines  Completed      I have personally reviewed and noted the following in the patient's chart:   . Medical and social history . Use of alcohol, tobacco or illicit drugs  . Current medications and supplements . Functional ability and status . Nutritional status . Physical activity . Advanced directives . List of other physicians . Hospitalizations, surgeries, and ER visits in previous 12 months . Vitals . Screenings to include cognitive, depression, and falls . Referrals and appointments  In addition, I have reviewed and discussed with patient certain preventive protocols, quality metrics, and best practice recommendations. A written personalized care plan for preventive services as well as general preventive health recommendations were provided to patient.     Joanne Chars, LPN  [id number]

## 2019-08-19 ENCOUNTER — Other Ambulatory Visit: Payer: Self-pay

## 2019-08-19 ENCOUNTER — Ambulatory Visit (INDEPENDENT_AMBULATORY_CARE_PROVIDER_SITE_OTHER): Payer: Medicare Other | Admitting: *Deleted

## 2019-08-19 VITALS — BP 141/75 | HR 91 | Temp 98.0°F | Ht 70.0 in | Wt 187.0 lb

## 2019-08-19 DIAGNOSIS — Z0000 Encounter for general adult medical examination without abnormal findings: Secondary | ICD-10-CM | POA: Diagnosis not present

## 2019-08-19 NOTE — Patient Instructions (Addendum)
Please schedule your next medicare wellness visit with me in 1 yr.  Patrick Perkins , Thank you for taking time to come for your Medicare Wellness Visit. I appreciate your ongoing commitment to your health goals. Please review the following plan we discussed and let me know if I can assist you in the future.  These are the goals we discussed: Goals    . DIET - REDUCE SODIUM INTAKE     Decrease salt intake to help keep blood pressure unde control.    . Exercise 150 min/wk Moderate Activity     Continue to exercise as you are doing. Your doing a great job at staying healthy.    . Exercise 3x per week (30 min per time)     Start back exercising like he used to getting more walking in. Would like to loose some weight as well.

## 2019-09-03 LAB — CBC
HCT: 39.1 % (ref 38.5–50.0)
Hemoglobin: 13.8 g/dL (ref 13.2–17.1)
MCH: 33.7 pg — ABNORMAL HIGH (ref 27.0–33.0)
MCHC: 35.3 g/dL (ref 32.0–36.0)
MCV: 95.4 fL (ref 80.0–100.0)
MPV: 11.4 fL (ref 7.5–12.5)
Platelets: 303 10*3/uL (ref 140–400)
RBC: 4.1 10*6/uL — ABNORMAL LOW (ref 4.20–5.80)
RDW: 12.8 % (ref 11.0–15.0)
WBC: 6.9 10*3/uL (ref 3.8–10.8)

## 2019-09-03 LAB — COMPLETE METABOLIC PANEL WITH GFR
AG Ratio: 1.7 (calc) (ref 1.0–2.5)
ALT: 19 U/L (ref 9–46)
AST: 15 U/L (ref 10–35)
Albumin: 4.2 g/dL (ref 3.6–5.1)
Alkaline phosphatase (APISO): 70 U/L (ref 35–144)
BUN: 14 mg/dL (ref 7–25)
CO2: 29 mmol/L (ref 20–32)
Calcium: 8.8 mg/dL (ref 8.6–10.3)
Chloride: 103 mmol/L (ref 98–110)
Creat: 0.98 mg/dL (ref 0.70–1.18)
GFR, Est African American: 89 mL/min/{1.73_m2} (ref >=60)
GFR, Est Non African American: 77 mL/min/{1.73_m2} (ref >=60)
Globulin: 2.5 g/dL (calc) (ref 1.9–3.7)
Glucose, Bld: 160 mg/dL — ABNORMAL HIGH (ref 65–99)
Potassium: 4.3 mmol/L (ref 3.5–5.3)
Sodium: 140 mmol/L (ref 135–146)
Total Bilirubin: 0.4 mg/dL (ref 0.2–1.2)
Total Protein: 6.7 g/dL (ref 6.1–8.1)

## 2019-09-03 LAB — LIPID PANEL
Cholesterol: 171 mg/dL (ref <200)
HDL: 62 mg/dL (ref >=40)
LDL Cholesterol (Calc): 85 mg/dL (calc)
Non-HDL Cholesterol (Calc): 109 mg/dL (calc) (ref <130)
Total CHOL/HDL Ratio: 2.8 (calc) (ref <5.0)
Triglycerides: 144 mg/dL (ref <150)

## 2019-09-03 LAB — HEMOGLOBIN A1C
Hgb A1c MFr Bld: 6.7 % of total Hgb — ABNORMAL HIGH (ref <5.7)
Mean Plasma Glucose: 146 (calc)
eAG (mmol/L): 8.1 (calc)

## 2019-09-05 ENCOUNTER — Ambulatory Visit (INDEPENDENT_AMBULATORY_CARE_PROVIDER_SITE_OTHER): Payer: Medicare Other | Admitting: Osteopathic Medicine

## 2019-09-05 ENCOUNTER — Other Ambulatory Visit: Payer: Self-pay

## 2019-09-05 ENCOUNTER — Encounter: Payer: Self-pay | Admitting: Osteopathic Medicine

## 2019-09-05 VITALS — BP 145/79 | HR 60 | Temp 98.1°F | Ht 68.0 in | Wt 188.0 lb

## 2019-09-05 DIAGNOSIS — E785 Hyperlipidemia, unspecified: Secondary | ICD-10-CM

## 2019-09-05 DIAGNOSIS — Z0000 Encounter for general adult medical examination without abnormal findings: Secondary | ICD-10-CM | POA: Diagnosis not present

## 2019-09-05 DIAGNOSIS — R7303 Prediabetes: Secondary | ICD-10-CM

## 2019-09-05 DIAGNOSIS — Z23 Encounter for immunization: Secondary | ICD-10-CM | POA: Diagnosis not present

## 2019-09-05 DIAGNOSIS — E119 Type 2 diabetes mellitus without complications: Secondary | ICD-10-CM

## 2019-09-05 MED ORDER — LOVASTATIN 40 MG PO TABS: 20.0000 mg | ORAL_TABLET | Freq: Every day | ORAL | 3 refills | Status: DC

## 2019-09-05 MED ORDER — ENALAPRIL MALEATE 2.5 MG PO TABS: 2.5000 mg | ORAL_TABLET | Freq: Every day | ORAL | 1 refills | Status: DC

## 2019-09-05 MED ORDER — METFORMIN HCL ER 500 MG PO TB24: 500.0000 mg | ORAL_TABLET | Freq: Every day | ORAL | 3 refills | Status: DC

## 2019-09-05 NOTE — Patient Instructions (Signed)
Plan:  Everything is looking pretty good!  Let's recheck blood pressure in a couple weeks. I've started a low dose blood pressure medicine. Ideally we'd like to see your BP less than 140/90, or if we can get it to 130/80 without you feeling dizzy let's try for that.

## 2019-09-05 NOTE — Progress Notes (Signed)
HPI: Patrick Perkins is a 73 y.o. male who  has a past medical history of Diabetes mellitus and ED (erectile dysfunction).  he presents to [facility] today, 09/05/19,  for chief complaint of: Annual check-up, review labs    Patient here for annual physical / wellness exam.  Preventive care measures reviewed, recent Medicare wellness visit notes reviewed. Recent labs reviewed in detail with the patient.   Stable A1C    Additional concerns today include:  None    Past medical, surgical, social and family history reviewed:  Patient Active Problem List   Diagnosis Date Noted  . Neuroforaminal stenosis of cervical spine 04/26/2018  . Degenerative disc disease, cervical 04/26/2018  . Left hip pain 04/26/2018  . Cervical radiculitis 07/09/2014  . Ganglion cyst 03/23/2014  . Encounter for monitoring statin therapy 02/24/2013  . Benign essential tremor 08/31/2011  . Hyperlipemia 08/31/2011  . Type 2 diabetes mellitus ([place]) 01/07/2010  . COLONIC POLYPS 01/01/2009  . DIVERTICULOSIS OF COLON 01/01/2009  . Former smoker 12/14/2008    Past Surgical History:  Procedure Laterality Date  . HERNIA REPAIR  2005   double    Social History   Tobacco Use  . Smoking status: Former Smoker    Quit date: 09/30/2009    Years since quitting: 9.9  . Smokeless tobacco: Never Used  Substance Use Topics  . Alcohol use: Yes    Alcohol/week: 1.0 standard drinks    Types: 1 Cans of beer per week    Comment: occasionally    Family History  Problem Relation Age of Onset  . Cancer Father 6       colon     Current medication list and allergy/intolerance information reviewed:    Current Outpatient Medications  Medication Sig Dispense Refill  . AMBULATORY NON FORMULARY MEDICATION One Touch Verio glucose monitor test strips and lancets.  Check blood sugar daily. Dx type 2 diabetes. E11.9 50 Units 11  . aspirin 81 MG tablet Take 81 mg by mouth daily.       Marland Kitchen azelastine (ASTELIN) 0.1 % nasal spray Place 2 sprays into both nostrils 2 (two) times daily. Use in each nostril as directed 30 mL 12  . glucose blood (ACCU-CHEK AVIVA) test strip Test blood sugar twice daily. Dx: E11.9 60 each 12  . lovastatin (MEVACOR) 40 MG tablet Take 0.5 tablets (20 mg total) by mouth at bedtime. 90 tablet 3  . metFORMIN (GLUCOPHAGE XR) 500 MG 24 hr tablet Take 1 tablet (500 mg total) by mouth daily with breakfast. 90 tablet 3  . Multiple Vitamins-Minerals (MULTIVITAMIN PO) Take by mouth.    . primidone (MYSOLINE) 250 MG tablet Take 250 mg by mouth at bedtime.    . primidone (MYSOLINE) 50 MG tablet Take by mouth at bedtime.    . enalapril (VASOTEC) 2.5 MG tablet Take 1 tablet (2.5 mg total) by mouth daily. 90 tablet 1   No current facility-administered medications for this visit.     No Known Allergies    Review of Systems:  Constitutional:  No  fever, no chills, No recent illness, No unintentional weight changes. No significant fatigue.   HEENT: No  headache, no vision change, no hearing change, No sore throat, No  sinus pressure  Cardiac: No  chest pain, No  pressure, No palpitations, No  Orthopnea  Respiratory:  No  shortness of breath. No  Cough  Gastrointestinal: No  abdominal pain, No  nausea, No  vomiting,  No  blood in stool, No  diarrhea, No  constipation   Musculoskeletal: No new myalgia/arthralgia  Skin: No  Rash, No other wounds/concerning lesions  Genitourinary: No  incontinence, No  abnormal genital bleeding, No abnormal genital discharge  Hem/Onc: No  easy bruising/bleeding, No  abnormal lymph node  Endocrine: No cold intolerance,  No heat intolerance. No polyuria/polydipsia/polyphagia   Neurologic: No  weakness, No  dizziness, No  slurred speech/focal weakness/facial droop  Psychiatric: No  concerns with depression, No  concerns with anxiety, No sleep problems, No mood problems  Exam:  BP (!) 145/79   Pulse 60   Temp 98.1 F  (36.7 C) (Oral)   Ht 5\' 8"  (1.727 m)   Wt 188 lb (85.3 kg)   BMI 28.59 kg/m   Constitutional: VS see above. General Appearance: alert, well-developed, well-nourished, NAD  Eyes: Normal lids and conjunctive, non-icteric sclera  Ears, Nose, Mouth, Throat: MMM, Normal external inspection ears/nares/mouth/lips/gums. TM normal bilaterally. Pharynx/tonsils no erythema, no exudate. Nasal mucosa normal.   Neck: No masses, trachea midline. No thyroid enlargement. No tenderness/mass appreciated. No lymphadenopathy  Respiratory: Normal respiratory effort. no wheeze, no rhonchi, no rales  Cardiovascular: S1/S2 normal, no murmur, no rub/gallop auscultated. RRR. No lower extremity edema. Pedal pulse II/IV bilaterally DP and PT. No carotid bruit or JVD. No abdominal aortic bruit.  Gastrointestinal: Nontender, no masses. No hepatomegaly, no splenomegaly. No hernia appreciated. Bowel sounds normal. Rectal exam deferred.   Musculoskeletal: Gait normal. No clubbing/cyanosis of digits.   Neurological: Normal balance/coordination. No tremor. No cranial nerve deficit on limited exam. Motor and sensation intact and symmetric. Cerebellar reflexes intact.   Skin: warm, dry, intact. No rash/ulcer. No concerning nevi or subq nodules on limited exam.    Psychiatric: Normal judgment/insight. Normal mood and affect. Oriented x3.    No results found for this or any previous visit (from the past 72 hour(s)).  No results found.   ASSESSMENT/PLAN: The primary encounter diagnosis was Annual physical exam. Diagnoses of Need for influenza vaccination, Type 2 diabetes mellitus without complication, without long-term current use of insulin ([place]), Hyperlipidemia, unspecified hyperlipidemia type, and Prediabetes were also pertinent to this visit.   Orders Placed This Encounter  Procedures  . Flu Vaccine QUAD High Dose(Fluad)    Meds ordered this encounter  Medications  . enalapril (VASOTEC) 2.5 MG tablet     Sig: Take 1 tablet (2.5 mg total) by mouth daily.    Dispense:  90 tablet    Refill:  1  . DISCONTD: lovastatin (MEVACOR) 40 MG tablet    Sig: Take 0.5 tablets (20 mg total) by mouth at bedtime.    Dispense:  90 tablet    Refill:  3  . metFORMIN (GLUCOPHAGE XR) 500 MG 24 hr tablet    Sig: Take 1 tablet (500 mg total) by mouth daily with breakfast.    Dispense:  90 tablet    Refill:  3  . lovastatin (MEVACOR) 40 MG tablet    Sig: Take 0.5 tablets (20 mg total) by mouth at bedtime.    Dispense:  90 tablet    Refill:  3    Patient Instructions  Plan:  Everything is looking pretty good!  Let's recheck blood pressure in a couple weeks. I've started a low dose blood pressure medicine. Ideally we'd like to see your BP less than 140/90, or if we can get it to 130/80 without you feeling dizzy let's try for that.  Visit summary with medication list and pertinent instructions was printed for patient to review. All questions at time of visit were answered - patient instructed to contact office with any additional concerns or updates. ER/RTC precautions were reviewed with the patient.    Please note: voice recognition software was used to produce this document, and typos may escape review. Please contact Dr. Sheppard Coil for any needed clarifications.     Follow-up plan: Return in about 2 weeks (around 09/19/2019) for nurse visit follow-up blood pressure on new medication .

## 2019-09-23 ENCOUNTER — Other Ambulatory Visit: Payer: Self-pay

## 2019-09-23 ENCOUNTER — Ambulatory Visit (INDEPENDENT_AMBULATORY_CARE_PROVIDER_SITE_OTHER): Payer: Medicare Other | Admitting: Osteopathic Medicine

## 2019-09-23 VITALS — BP 138/88 | HR 72 | Temp 98.2°F | Ht 68.0 in | Wt 188.0 lb

## 2019-09-23 DIAGNOSIS — R030 Elevated blood-pressure reading, without diagnosis of hypertension: Secondary | ICD-10-CM

## 2019-09-23 MED ORDER — ENALAPRIL MALEATE 2.5 MG PO TABS: 5.0000 mg | ORAL_TABLET | Freq: Every day | ORAL | Status: DC

## 2019-09-23 NOTE — Progress Notes (Signed)
Patient presents to clinic for Blood Pressure check. Patient brought his machine to compare to our machine. The first reading was 147/69 on our machine and it was 160/102 on his machine. I verified the patient was taking his medication once daily and he states "yes I am".  I let him sit for a few minutes and then it was 138/88 on the repeat with manual cuff. PCP was aware and wanted patient to come back in 2 weeks for a nurse visit. AVS printed with instructions on the paper. Patient voices understanding and did not have any further questions. Patient will take 2 of the 2.5 tablets and it was updated on the med list.

## 2019-10-07 ENCOUNTER — Other Ambulatory Visit: Payer: Self-pay

## 2019-10-07 ENCOUNTER — Ambulatory Visit (INDEPENDENT_AMBULATORY_CARE_PROVIDER_SITE_OTHER): Payer: Medicare Other | Admitting: Osteopathic Medicine

## 2019-10-07 VITALS — BP 138/82 | HR 67 | Temp 98.2°F | Ht 68.0 in | Wt 188.0 lb

## 2019-10-07 DIAGNOSIS — R030 Elevated blood-pressure reading, without diagnosis of hypertension: Secondary | ICD-10-CM

## 2019-10-07 NOTE — Patient Instructions (Signed)
Please keep taking the 5 mg dose of Vasotec. Please keep a log of your Blood Pressures at least once daily. Come back in 1 week for a nurse visit and bring home Blood Pressure cuff.

## 2019-10-07 NOTE — Progress Notes (Signed)
Patient presents to clinic after 2 weeks of increase of Vasotec from 2.5 mg to 5 mg once daily. Patient reports taking BP at home but did not bring the home BP cuff today. He was given the AVS with instructions to bring the cuff in 1 week. Patient did not have any questions and voices understanding. Patient initial BP was elevated and the second check it was down.

## 2019-10-14 ENCOUNTER — Other Ambulatory Visit: Payer: Self-pay

## 2019-10-14 ENCOUNTER — Ambulatory Visit (INDEPENDENT_AMBULATORY_CARE_PROVIDER_SITE_OTHER): Payer: Medicare Other | Admitting: Osteopathic Medicine

## 2019-10-14 VITALS — BP 143/68 | HR 74 | Ht 68.0 in | Wt 188.0 lb

## 2019-10-14 DIAGNOSIS — R030 Elevated blood-pressure reading, without diagnosis of hypertension: Secondary | ICD-10-CM

## 2019-10-14 NOTE — Progress Notes (Signed)
Patient is aware and will keep taking the 2 tablets daily. He is going to get a new BP cuff and bring it at the next visit. He is aware to bring his new cuff to the visit. hsm.

## 2019-10-14 NOTE — Progress Notes (Signed)
BP Readings from Last 3 Encounters:  10/14/19 (!) 143/68  10/07/19 138/82  09/23/19 138/88   Would agree that he probably needs a different home blood pressure cuff if his is old.  Once he has this, I would have him check his blood pressure once or twice per day, can follow-up with [place] in another 2 weeks for nurse visit blood pressure check, he sure to bring home blood pressure monitor with him to the office

## 2019-10-14 NOTE — Progress Notes (Signed)
Patient presents to clinic for BP check. Patient is here for a 1 week follow up of blood pressures. Patient brought his cuff today and it was reading 170/106 with heart rate of 64. He has been taking 2 tablets of the Vasotec daily. He reports he does not need a refill at this time.   Patient is going to get a new cuff for home since the one he has is old at this time. Repeat pressure was lower than last time. I let the patient know that we will call him with a follow up and any changes to his medication. He did not have any questions.   I have placed a copy of the Blood pressure readings for scanning to go in the patient chart.

## 2019-10-15 LAB — HM COLONOSCOPY

## 2019-10-22 ENCOUNTER — Encounter: Payer: Self-pay | Admitting: Osteopathic Medicine

## 2019-10-30 ENCOUNTER — Ambulatory Visit (INDEPENDENT_AMBULATORY_CARE_PROVIDER_SITE_OTHER): Payer: Medicare Other | Admitting: Osteopathic Medicine

## 2019-10-30 ENCOUNTER — Other Ambulatory Visit: Payer: Self-pay

## 2019-10-30 VITALS — BP 142/73 | HR 66

## 2019-10-30 DIAGNOSIS — R030 Elevated blood-pressure reading, without diagnosis of hypertension: Secondary | ICD-10-CM | POA: Diagnosis not present

## 2019-10-30 MED ORDER — ENALAPRIL MALEATE 10 MG PO TABS: 10.0000 mg | ORAL_TABLET | Freq: Every day | ORAL | 0 refills | Status: DC

## 2019-10-30 NOTE — Progress Notes (Signed)
Pt home bP monitor >10 pts difference systolic, I'd go by our measurements here and let's adjust Rx

## 2019-10-30 NOTE — Patient Instructions (Signed)
Increase Enalapril (Vasotec) to 10 mg daily. Prescription sent to your pharmacy. Follow up for nurse visit in 2 weeks.

## 2019-10-30 NOTE — Progress Notes (Signed)
Patient presents to clinic for BP check. Patient is here for a 2 week follow up for elevated blood pressure. Patient brought his cuff today along with a log of readings from home since his last visit.  He has been taking 2 tablets of the Vasotec daily.   Blood pressure readings today are as follows: Today's Vitals   10/30/19 0857 10/30/19 0909 10/30/19 0910  BP: (!) 151/75 135/75 (!) 142/73  Pulse: 71 68 66  SpO2: 98%      135/75 was taken with patient's home blood pressure meter. I consulted with Dr. Sheppard Coil and she has increased Vasotec to 10 mg daily. Prescription sent to patient's pharmacy and he was made aware of the change. He will make a nurse visit for two weeks to recheck blood pressure and will keep a daily log at home. Will call with any problems prior to appt.   I have placed a copy of the Blood pressure readings for scanning to go in the patient chart.

## 2019-11-11 ENCOUNTER — Other Ambulatory Visit: Payer: Self-pay

## 2019-11-11 ENCOUNTER — Ambulatory Visit (INDEPENDENT_AMBULATORY_CARE_PROVIDER_SITE_OTHER): Payer: Medicare Other | Admitting: Osteopathic Medicine

## 2019-11-11 VITALS — BP 132/64 | HR 68 | Temp 98.3°F | Wt 188.0 lb

## 2019-11-11 DIAGNOSIS — I10 Essential (primary) hypertension: Secondary | ICD-10-CM

## 2019-11-11 NOTE — Progress Notes (Signed)
Patient in today for BP check. BP at last visit was 151/75 and pulse was 71. Patient reports he is taking the increased dose of vasotec (10 mg). BP in the office today is 132/64 pulse 68. Pt denies headaches, blurred vision, chest pain and shortness of breath. Pt is to continue with current medications, and keep follow up appt. Pt is to call office if he feels BP is getting to high or low.

## 2019-11-12 ENCOUNTER — Ambulatory Visit: Payer: Medicare Other

## 2020-01-22 ENCOUNTER — Other Ambulatory Visit: Payer: Self-pay

## 2020-01-22 DIAGNOSIS — E119 Type 2 diabetes mellitus without complications: Secondary | ICD-10-CM

## 2020-01-22 MED ORDER — GLUCOSE BLOOD VI STRP: ORAL_STRIP | 99 refills | Status: DC

## 2020-02-05 DIAGNOSIS — H43813 Vitreous degeneration, bilateral: Secondary | ICD-10-CM | POA: Diagnosis not present

## 2020-02-05 DIAGNOSIS — E113293 Type 2 diabetes mellitus with mild nonproliferative diabetic retinopathy without macular edema, bilateral: Secondary | ICD-10-CM | POA: Diagnosis not present

## 2020-02-05 DIAGNOSIS — H40013 Open angle with borderline findings, low risk, bilateral: Secondary | ICD-10-CM | POA: Diagnosis not present

## 2020-02-05 DIAGNOSIS — H5213 Myopia, bilateral: Secondary | ICD-10-CM | POA: Diagnosis not present

## 2020-02-05 DIAGNOSIS — H25813 Combined forms of age-related cataract, bilateral: Secondary | ICD-10-CM | POA: Diagnosis not present

## 2020-02-13 DIAGNOSIS — J029 Acute pharyngitis, unspecified: Secondary | ICD-10-CM | POA: Diagnosis not present

## 2020-02-13 DIAGNOSIS — Z03818 Encounter for observation for suspected exposure to other biological agents ruled out: Secondary | ICD-10-CM | POA: Diagnosis not present

## 2020-02-17 ENCOUNTER — Other Ambulatory Visit: Payer: Self-pay

## 2020-02-17 ENCOUNTER — Ambulatory Visit (INDEPENDENT_AMBULATORY_CARE_PROVIDER_SITE_OTHER): Payer: Medicare PPO | Admitting: Osteopathic Medicine

## 2020-02-17 ENCOUNTER — Encounter: Payer: Self-pay | Admitting: Osteopathic Medicine

## 2020-02-17 VITALS — BP 124/77 | HR 66 | Temp 98.2°F | Wt 184.1 lb

## 2020-02-17 DIAGNOSIS — E1169 Type 2 diabetes mellitus with other specified complication: Secondary | ICD-10-CM | POA: Diagnosis not present

## 2020-02-17 DIAGNOSIS — E785 Hyperlipidemia, unspecified: Secondary | ICD-10-CM | POA: Diagnosis not present

## 2020-02-17 DIAGNOSIS — E119 Type 2 diabetes mellitus without complications: Secondary | ICD-10-CM | POA: Diagnosis not present

## 2020-02-17 LAB — POCT GLYCOSYLATED HEMOGLOBIN (HGB A1C): Hemoglobin A1C: 6.6 % — AB (ref 4.0–5.6)

## 2020-02-17 NOTE — Progress Notes (Signed)
Patrick Perkins is a 74 y.o. male who presents to  [place] at [facility]  today, 02/17/20, seeking care for the following: . DM2 . HTN     ASSESSMENT & PLAN with other pertinent history/findings:  The primary encounter diagnosis was Type 2 diabetes mellitus without complication, without long-term current use of insulin ([place]). A diagnosis of Hyperlipidemia associated with type 2 diabetes mellitus ([place]) was also pertinent to this visit.   DM2 see below - looking good! Pt was concerned since his fasting sigars were 130s-150s pretty consistently. No hypoglycemia   HTN stable  Labs: 09/02/2019 Lipids, CBC, CMP ok renal fxn WNL        DIABETES SCREENING/PREVENTIVE CARE:  Medications as of 02/17/20, --> changes made 02/17/20:  . Metformin: XR 500 mg daily . Other antihyperglycemics: none . ACE/ARB: enalapril 10 mg daily o (Microalbumin needed? no) . Antiplatelet if ASCVD Risk >10%: Yes taking ASA o The 10-year ASCVD risk score Mikey Bussing DC Jr., et al., 2013) is: 34.2% . Statin: Lovastatin 40 mg dialy  o LDL goal <70: was 85 09/02/2019  A1C:  Today 02/17/20: 6.6  09/02/2019: 6.7  BP goal <130/80:  BP Readings from Last 3 Encounters:  02/17/20 124/77  11/11/19 132/64  10/30/19 (!) 142/73   Wt Readings from Last 3 Encounters:  02/17/20 184 lb 1.3 oz (83.5 kg)  11/11/19 188 lb (85.3 kg)  10/14/19 188 lb (85.3 kg)   Eye exam annually: none on file, importance discussed with patient Foot exam: done  Pneumovax:: done Immunization History  Administered Date(s) Administered  . Fluad Quad(high Dose 65+) 09/05/2019  . H1N1 12/14/2008  . Influenza Split 09/04/2011, 09/03/2012  . Influenza Whole 09/06/2009, 09/20/2010, 08/20/2013  . Influenza, High Dose Seasonal PF 09/18/2017  . Influenza,inj,Quad PF,6+ Mos 09/10/2018  . Influenza-Unspecified 08/20/2014, 07/22/2015, 09/03/2016  . Pneumococcal Conjugate-13 04/23/2015  .  Pneumococcal Polysaccharide-23 09/20/2010, 04/03/2017  . Td 12/14/2008  . Tdap 10/29/2018  . Zoster 02/29/2012  . Zoster Recombinat (Shingrix) 04/07/2017, 06/21/2017        There are no Patient Instructions on file for this visit.   Orders Placed This Encounter  Procedures  . POCT HgB A1C    No orders of the defined types were placed in this encounter.      Follow-up instructions: No follow-ups on file.                                         There were no vitals taken for this visit.  No outpatient medications have been marked as taking for the 02/17/20 encounter (Office Visit) with Emeterio Reeve, DO.    No results found for this or any previous visit (from the past 72 hour(s)).  No results found.  Depression screen [facility] 2/9 09/05/2019 08/19/2019 10/29/2018  Decreased Interest 0 0 0  Down, Depressed, Hopeless 0 0 0  PHQ - 2 Score 0 0 0  Altered sleeping 0 - -  Tired, decreased energy 1 - -  Change in appetite 0 - -  Feeling bad or failure about yourself  0 - -  Trouble concentrating 0 - -  Moving slowly or fidgety/restless 0 - -  Suicidal thoughts 0 - -  PHQ-9 Score 1 - -  Difficult doing work/chores Not difficult at all - -    GAD 7 : Generalized Anxiety Score 09/05/2019 10/29/2018  07/30/2018  Nervous, Anxious, on Edge 0 0 0  Control/stop worrying 0 0 0  Worry too much - different things 0 0 0  Trouble relaxing 0 0 0  Restless 0 0 0  Easily annoyed or irritable 0 0 0  Afraid - awful might happen 0 0 0  Total GAD 7 Score 0 0 0  Anxiety Difficulty Not difficult at all - Not difficult at all      All questions at time of visit were answered - patient instructed to contact office with any additional concerns or updates.  ER/RTC precautions were reviewed with the patient.  Please note: voice recognition software was used to produce this document, and typos may escape review. Please contact Dr. Sheppard Coil for any  needed clarifications.

## 2020-03-16 IMAGING — DX DG CERVICAL SPINE 2 OR 3 VIEWS
3 series · 3 of 3 positions shown · non-contrast
Comparison: None

CLINICAL DATA: Years of posterior neck pain radiating to the back
of both shoulders

EXAM:
CERVICAL SPINE - 2-3 VIEW

[c-spine lat]
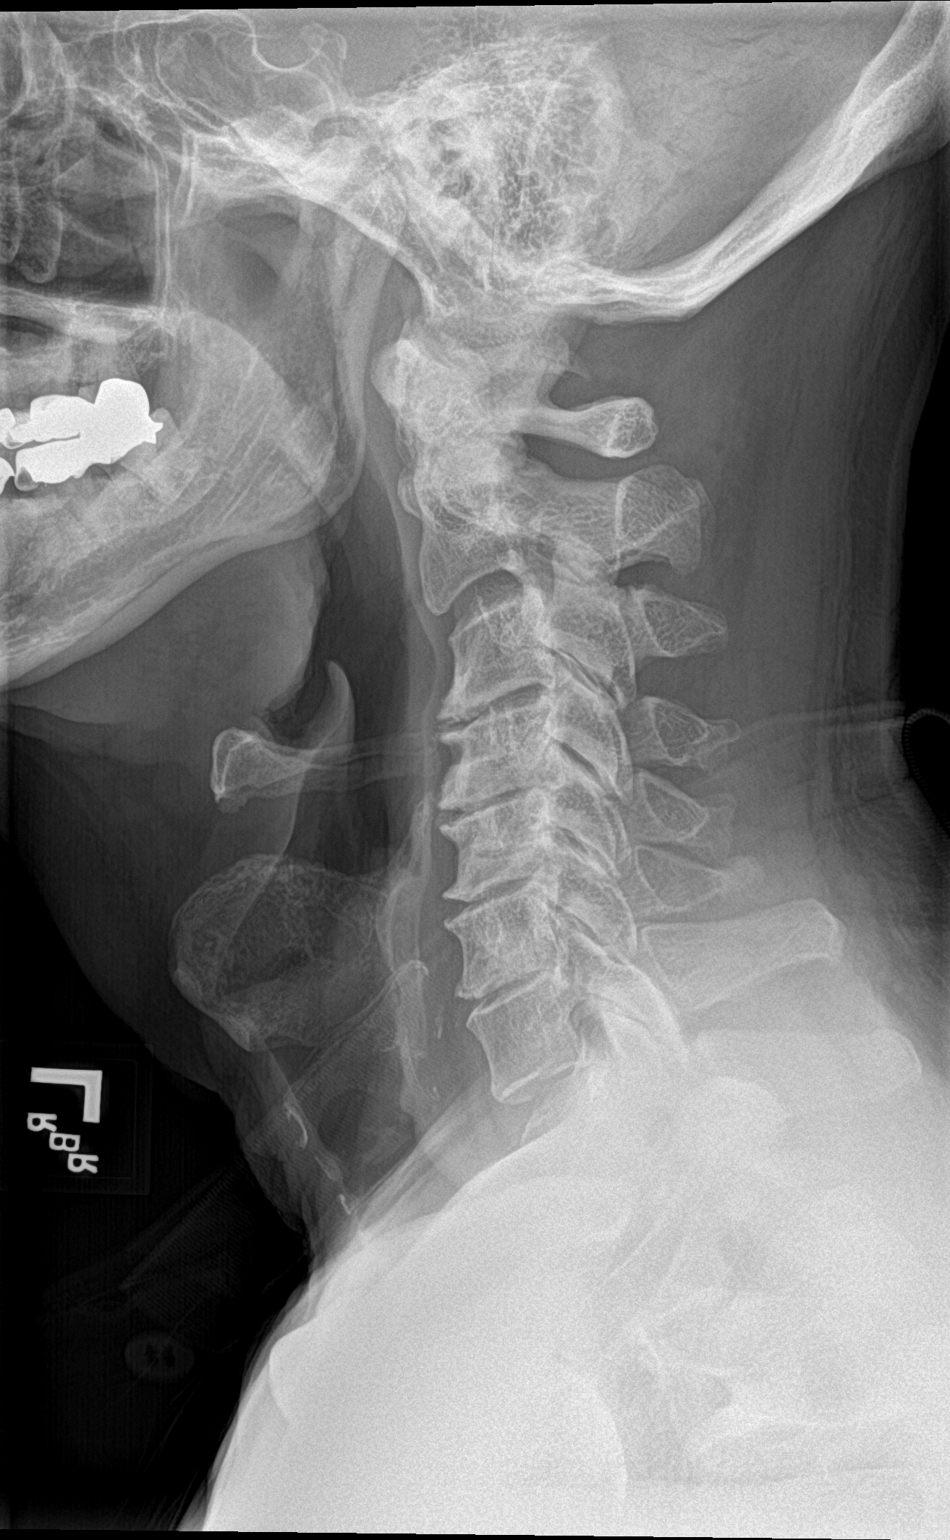

[c-spine ap]
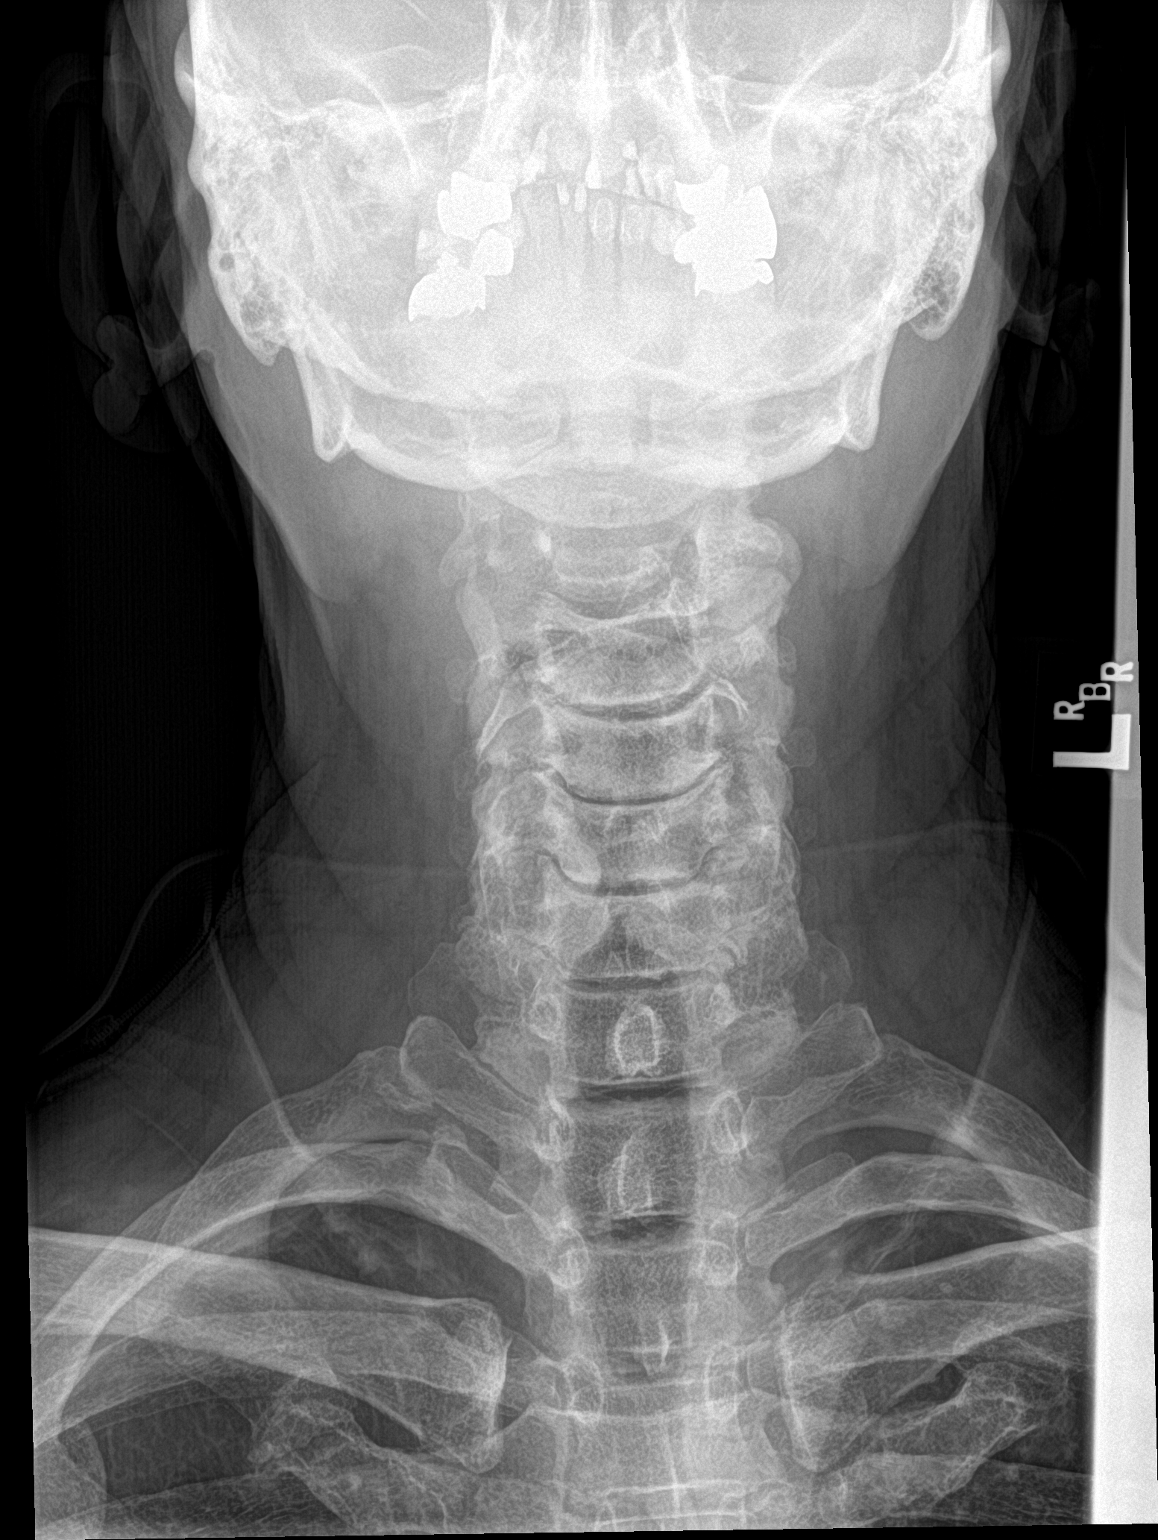

[c-spine open mouth]
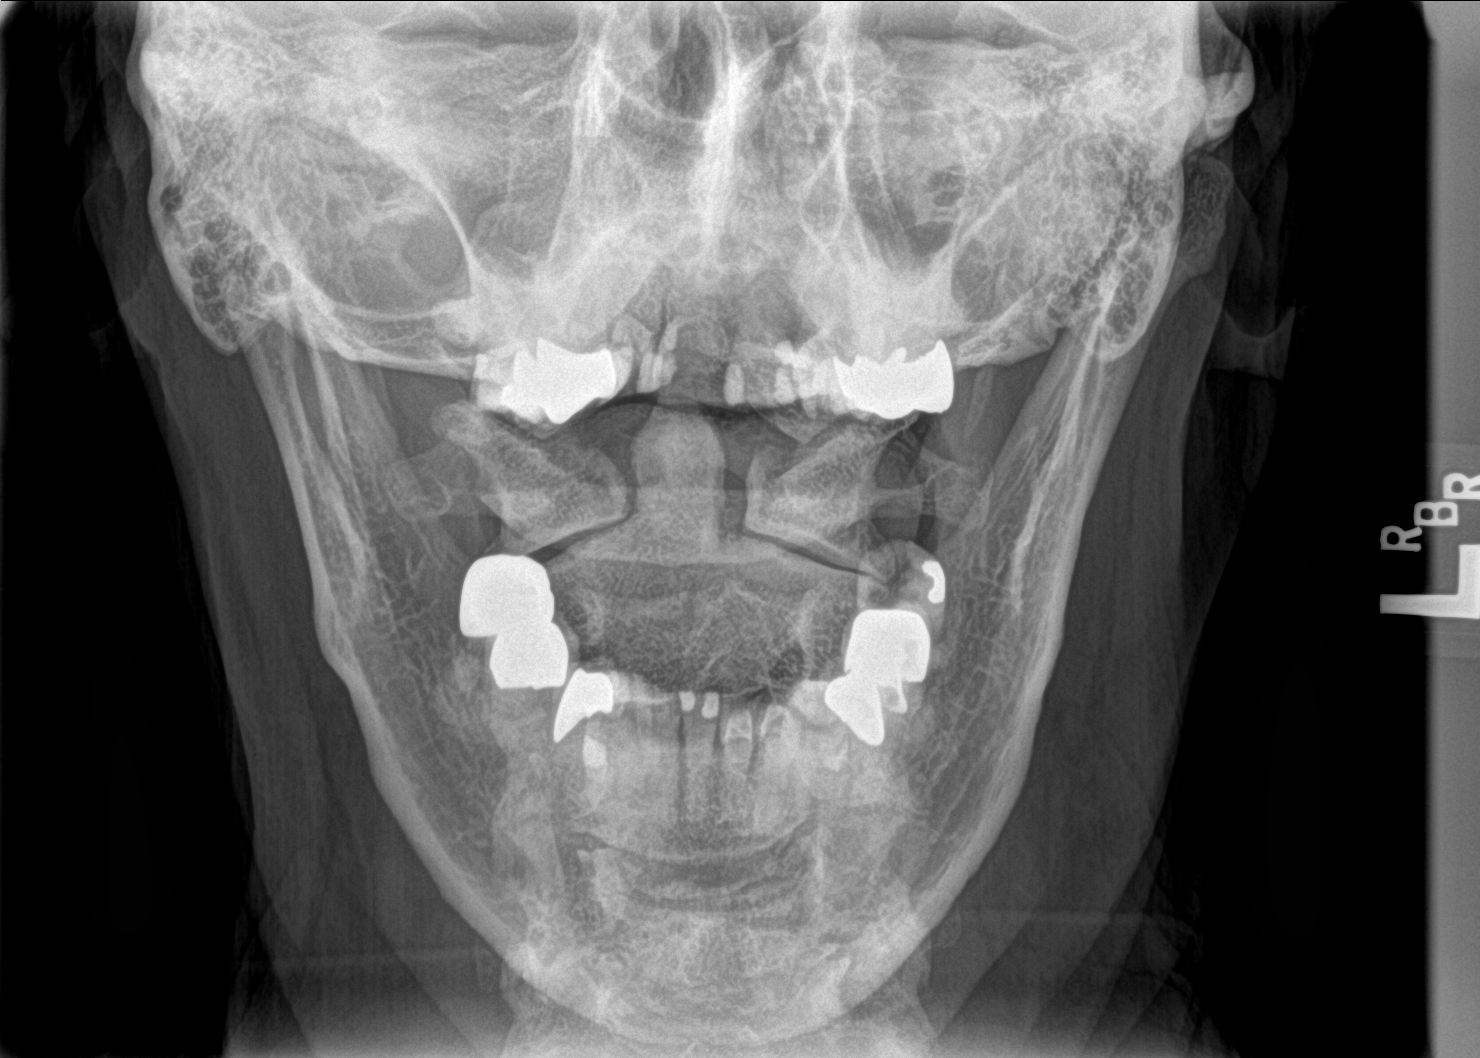

[3 of 3 positions shown; findings below may reference images not displayed]

FINDINGS: Prevertebral soft tissues normal thickness.

Reversal of cervical lordosis question muscle spasm.

Osseous mineralization normal.

Disc space narrowing and endplate spur formation at C3-C4 through
C6-C7.

Vertebral body heights maintained without fracture or subluxation.

No bone destruction.

Scattered facet degenerative changes at multiple levels.

Odontoid process slightly approximates the RIGHT lateral mass of C1
on the open-mouth view though the head is minimally turned.

Lung apices clear.
IMPRESSION: Degenerative disc and facet disease changes of the cervical spine as
above without fracture or dislocation.

## 2020-03-16 IMAGING — DX DG HIP (WITH OR WITHOUT PELVIS) 2-3V*L*
3 series · 3 of 3 positions shown · non-contrast
Comparison: None.

CLINICAL DATA: Left hip pain for 6 weeks.

EXAM:
DG HIP (WITH OR WITHOUT PELVIS) 2-3V LEFT

[pelvis ap]
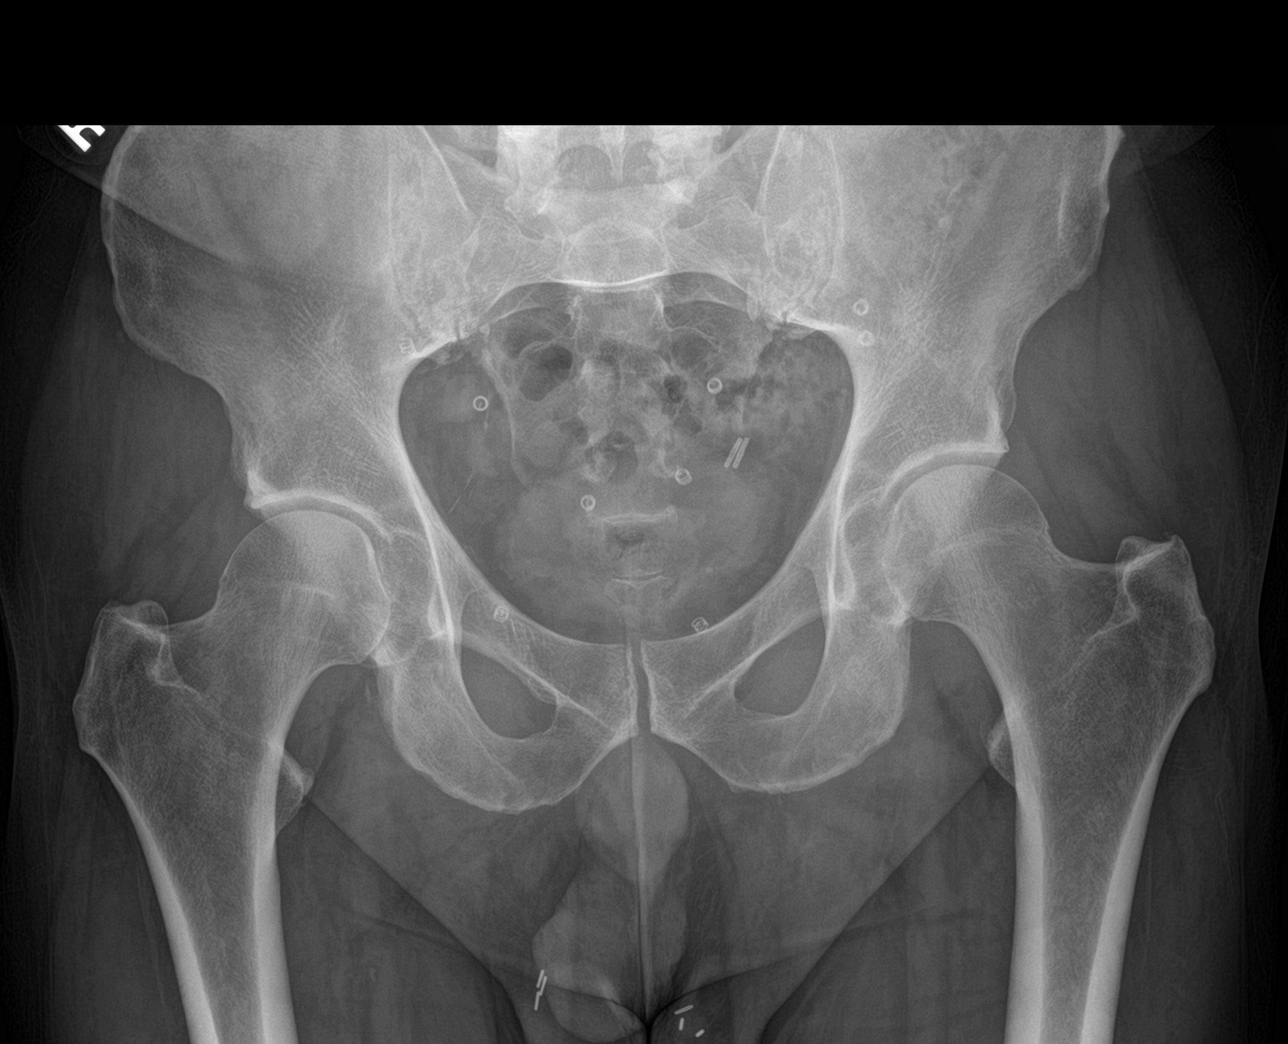

[hip ap]
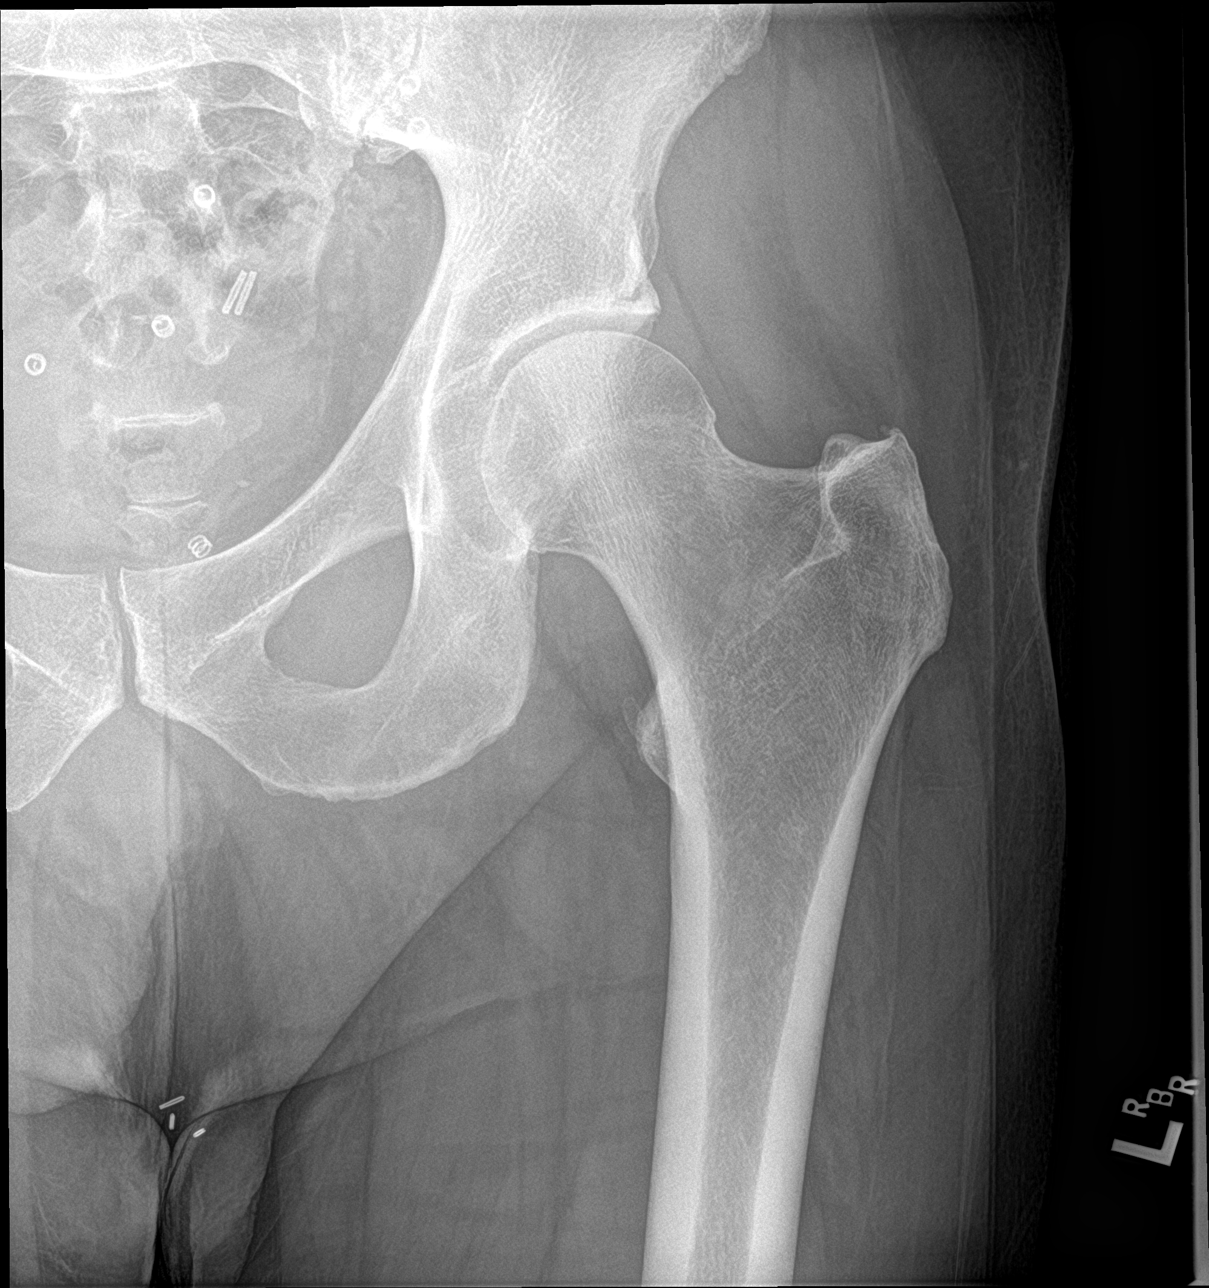

[hip lat]
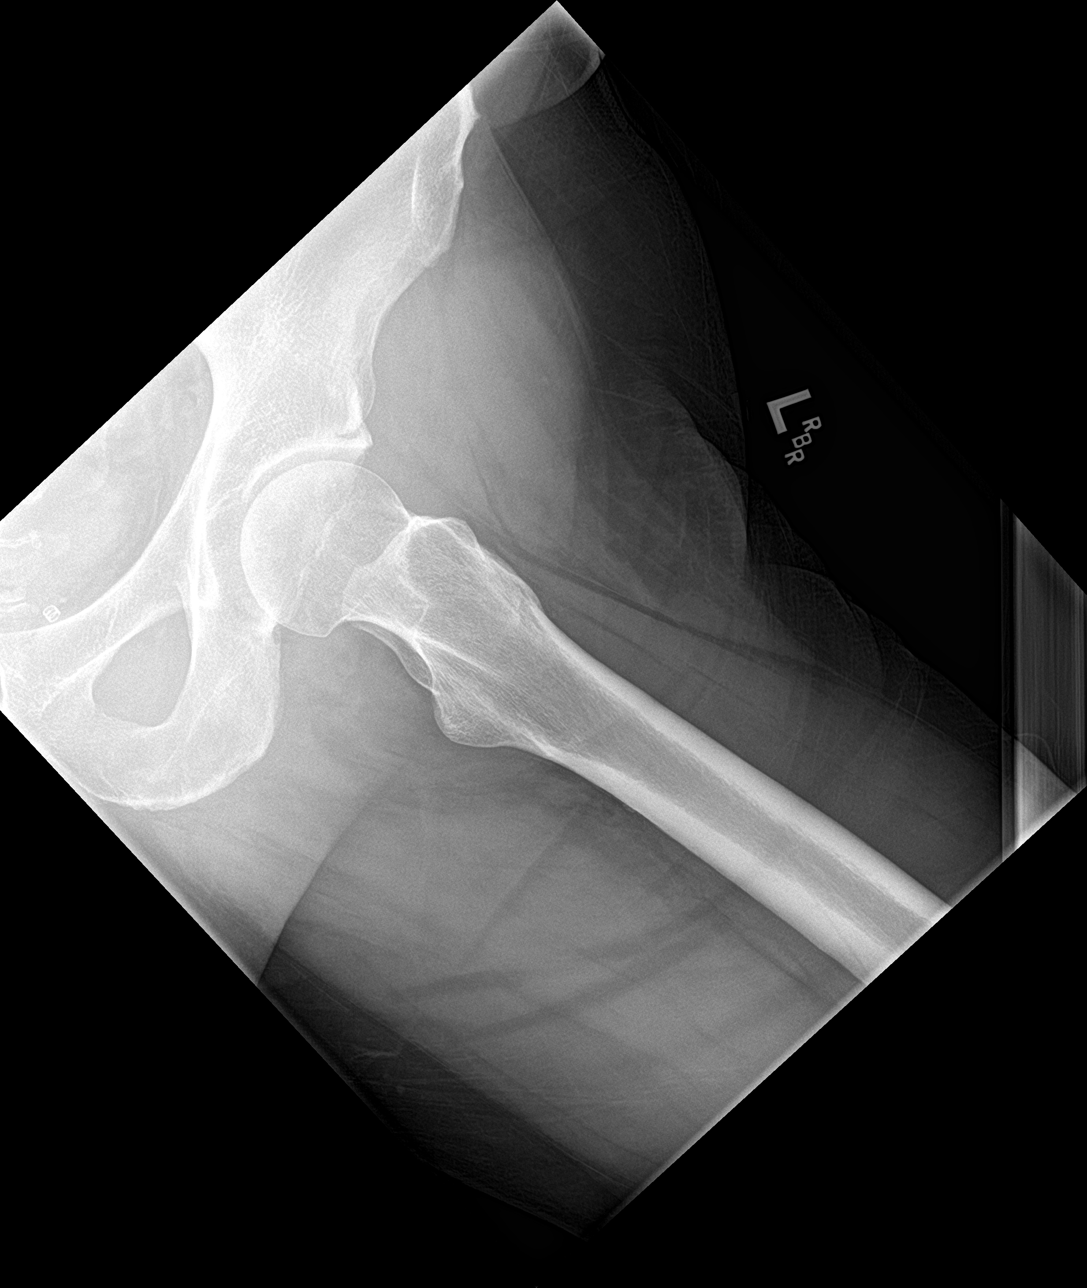

[3 of 3 positions shown; findings below may reference images not displayed]

FINDINGS: There is no evidence of hip fracture or dislocation. There is no
evidence of arthropathy or other focal bone abnormality.
IMPRESSION: Negative.

## 2020-03-19 ENCOUNTER — Other Ambulatory Visit: Payer: Self-pay | Admitting: Osteopathic Medicine

## 2020-04-02 DIAGNOSIS — M542 Cervicalgia: Secondary | ICD-10-CM | POA: Diagnosis not present

## 2020-04-02 DIAGNOSIS — M546 Pain in thoracic spine: Secondary | ICD-10-CM | POA: Diagnosis not present

## 2020-04-02 DIAGNOSIS — M9901 Segmental and somatic dysfunction of cervical region: Secondary | ICD-10-CM | POA: Diagnosis not present

## 2020-04-02 DIAGNOSIS — M9902 Segmental and somatic dysfunction of thoracic region: Secondary | ICD-10-CM | POA: Diagnosis not present

## 2020-04-23 ENCOUNTER — Encounter: Payer: Self-pay | Admitting: Osteopathic Medicine

## 2020-04-23 ENCOUNTER — Ambulatory Visit (INDEPENDENT_AMBULATORY_CARE_PROVIDER_SITE_OTHER): Payer: Medicare PPO

## 2020-04-23 ENCOUNTER — Ambulatory Visit (INDEPENDENT_AMBULATORY_CARE_PROVIDER_SITE_OTHER): Payer: Medicare PPO | Admitting: Osteopathic Medicine

## 2020-04-23 ENCOUNTER — Telehealth: Payer: Self-pay

## 2020-04-23 VITALS — BP 146/84 | HR 69 | Temp 97.0°F | Wt 183.0 lb

## 2020-04-23 DIAGNOSIS — M5412 Radiculopathy, cervical region: Secondary | ICD-10-CM | POA: Diagnosis not present

## 2020-04-23 DIAGNOSIS — R937 Abnormal findings on diagnostic imaging of other parts of musculoskeletal system: Secondary | ICD-10-CM | POA: Diagnosis not present

## 2020-04-23 DIAGNOSIS — M542 Cervicalgia: Secondary | ICD-10-CM

## 2020-04-23 NOTE — Patient Instructions (Addendum)
Xray today - ask for disc!  Records to chiropractor  PT referral  May need MRI  OK to use Tylenol/Ibuprofen as needed

## 2020-04-23 NOTE — Telephone Encounter (Signed)
Patient scheduled today at 10:30

## 2020-04-23 NOTE — Progress Notes (Signed)
Patrick Perkins is a 74 y.o. male who presents to  [place] at [facility]  today, 04/23/20, seeking care for the following: . Neck pain radiating down arms: Has seen a chiropractor once for this, patient reports history of fall and neck injury may be 30 years ago, no recent fall.  Notes progressively increasing neck pain and some numbness/tingling down into his upper extremities bilaterally, occasionally down into the left lower extremity.  No gait disturbance.  On physical exam, strength is intact, negative Spurling's bilaterally.     ASSESSMENT & PLAN with other pertinent history/findings:  The primary encounter diagnosis was Neck pain. A diagnosis of Cervical radiculopathy was also pertinent to this visit.  Sounds like cervical radiculopathy, I think physical therapy would be the next best step, okay to continue with chiropractics but this may be something where we are heading toward an MRI/interventional treatment  Patient Instructions  Xray today - ask for disc!  Records to chiropractor  PT referral  May need MRI  OK to use Tylenol/Ibuprofen as needed      Orders Placed This Encounter  Procedures  . DG Cervical Spine Complete  . Ambulatory referral to Physical Therapy    No orders of the defined types were placed in this encounter.      Follow-up instructions: Return for RECHECK PENDING RESULTS / IF WORSE OR CHANGE.                                         BP (!) 146/84 (BP Location: Left Arm, Patient Position: Sitting)   Pulse 69   Temp (!) 97 F (36.1 C)   Wt 183 lb (83 kg)   BMI 27.83 kg/m   Current Meds  Medication Sig  . AMBULATORY NON FORMULARY MEDICATION One Touch Verio glucose monitor test strips and lancets.  Check blood sugar daily. Dx type 2 diabetes. E11.9  . aspirin 81 MG tablet Take 81 mg by mouth daily.    Marland Kitchen azelastine (ASTELIN) 0.1 % nasal spray Place 2 sprays  into both nostrils 2 (two) times daily. Use in each nostril as directed  . enalapril (VASOTEC) 10 MG tablet Take 1 tablet by mouth once daily  . glucose blood (ACCU-CHEK AVIVA) test strip Test blood sugar twice daily. Dx: E11.9  . lovastatin (MEVACOR) 40 MG tablet Take 0.5 tablets (20 mg total) by mouth at bedtime.  . metFORMIN (GLUCOPHAGE XR) 500 MG 24 hr tablet Take 1 tablet (500 mg total) by mouth daily with breakfast.  . Multiple Vitamins-Minerals (MULTIVITAMIN PO) Take by mouth.  . primidone (MYSOLINE) 250 MG tablet Take 250 mg by mouth at bedtime.  . primidone (MYSOLINE) 50 MG tablet Take by mouth at bedtime.    No results found for this or any previous visit (from the past 72 hour(s)).  No results found.  Depression screen [facility] 2/9 09/05/2019 08/19/2019 10/29/2018  Decreased Interest 0 0 0  Down, Depressed, Hopeless 0 0 0  PHQ - 2 Score 0 0 0  Altered sleeping 0 - -  Tired, decreased energy 1 - -  Change in appetite 0 - -  Feeling bad or failure about yourself  0 - -  Trouble concentrating 0 - -  Moving slowly or fidgety/restless 0 - -  Suicidal thoughts 0 - -  PHQ-9 Score 1 - -  Difficult doing work/chores Not  difficult at all - -    GAD 7 : Generalized Anxiety Score 09/05/2019 10/29/2018 07/30/2018  Nervous, Anxious, on Edge 0 0 0  Control/stop worrying 0 0 0  Worry too much - different things 0 0 0  Trouble relaxing 0 0 0  Restless 0 0 0  Easily annoyed or irritable 0 0 0  Afraid - awful might happen 0 0 0  Total GAD 7 Score 0 0 0  Anxiety Difficulty Not difficult at all - Not difficult at all      All questions at time of visit were answered - patient instructed to contact office with any additional concerns or updates.  ER/RTC precautions were reviewed with the patient.  Please note: voice recognition software was used to produce this document, and typos may escape review. Please contact Dr. Sheppard Coil for any needed clarifications.

## 2020-04-23 NOTE — Telephone Encounter (Signed)
Joelene Millin from [facility] called stating they had sent a fax 2 weeks ago requesting PCP order x-rays for patient and they wanted to follow up. State due to patient's insurance, they are not able to order these. I inquired about what x-rays they were requesting and the reason, I was told patient had a fall and was in a lot of pain and they wanted to evaluate.  I advised Joelene Millin that patient ABSOLUTELY needs appointment to be evaluated by our office prior to ordering any imaging and he needs to be evaluated IN PERSON for any fall. She asked that our office call to schedule this.   Please call patient ASAP. FYI to PCP.

## 2020-04-26 NOTE — Addendum Note (Signed)
Addended by: Maryla Morrow on: 04/26/2020 10:10 AM   Modules accepted: Orders

## 2020-05-02 ENCOUNTER — Other Ambulatory Visit: Payer: Self-pay

## 2020-05-02 ENCOUNTER — Ambulatory Visit (INDEPENDENT_AMBULATORY_CARE_PROVIDER_SITE_OTHER): Payer: Medicare PPO

## 2020-05-02 DIAGNOSIS — R937 Abnormal findings on diagnostic imaging of other parts of musculoskeletal system: Secondary | ICD-10-CM

## 2020-05-02 DIAGNOSIS — M5412 Radiculopathy, cervical region: Secondary | ICD-10-CM

## 2020-05-02 DIAGNOSIS — M4802 Spinal stenosis, cervical region: Secondary | ICD-10-CM | POA: Diagnosis not present

## 2020-05-02 DIAGNOSIS — M542 Cervicalgia: Secondary | ICD-10-CM

## 2020-05-13 ENCOUNTER — Encounter: Payer: Self-pay | Admitting: Physical Therapy

## 2020-05-13 ENCOUNTER — Other Ambulatory Visit: Payer: Self-pay

## 2020-05-13 ENCOUNTER — Ambulatory Visit (INDEPENDENT_AMBULATORY_CARE_PROVIDER_SITE_OTHER): Payer: Medicare PPO | Admitting: Physical Therapy

## 2020-05-13 DIAGNOSIS — M5412 Radiculopathy, cervical region: Secondary | ICD-10-CM | POA: Diagnosis not present

## 2020-05-13 DIAGNOSIS — M542 Cervicalgia: Secondary | ICD-10-CM

## 2020-05-13 NOTE — Patient Instructions (Signed)
Access Code: [email or web address] URL: [email or web address] Date: 05/13/2020 Prepared by: Almyra Free  Exercises Seated Upper Trapezius Stretch - 2 x daily - 7 x weekly - 3 reps - 1 sets - 30-60 sec hold Sternocleidomastoid Stretch - 2 x daily - 7 x weekly - 1 sets - 3 reps - 30-60 hold Supine Cervical Rotation PROM - 2 x daily - 7 x weekly - 1 sets - 5 reps - 10 sec hold Standing Isometric Cervical Sidebending with Manual Resistance - 1 x daily - 7 x weekly - 5 reps - 1 sets - 10 sec hold hold Standing Isometric Cervical Flexion with Manual Resistance - 1 x daily - 7 x weekly - 5 reps - 1 sets - 10 sec hold

## 2020-05-13 NOTE — Therapy (Signed)
[place], [place], [address] Phone: [phone]   Fax:  [phone]  Physical Therapy Evaluation  Patient Details  Name: Patrick Perkins MRN: [id number] Date of Birth: 13-Mar-1946 Referring Provider (PT): Emeterio Reeve DO   Encounter Date: 05/13/2020   PT End of Session - 05/13/20 1024    Visit Number 1    Number of Visits 12    Date for PT Re-Evaluation 06/24/20    Authorization Type [employer] MCR    Authorization Time Period 05/13/20-06/24/20    Authorization - Visit Number 1    Authorization - Number of Visits 12    Progress Note Due on Visit 10    PT Start Time 1024    PT Stop Time 1059    PT Time Calculation (min) 35 min    Activity Tolerance Patient tolerated treatment well    Behavior During Therapy [facility] for tasks assessed/performed           Past Medical History:  Diagnosis Date   Diabetes mellitus    ED (erectile dysfunction)     Past Surgical History:  Procedure Laterality Date   HERNIA REPAIR  2005   double    There were no vitals filed for this visit.    Subjective Assessment - 05/13/20 1025    Subjective Patient having tingling coming down bil shoulders and interittently into left knee. Driving bothers him the most.    Pertinent History DM, OA, HTN, essential tremor    Limitations Other (comment)   driving   Diagnostic tests MRI - multilevel moderate to severe stenosis worse at C3/4 and C4/5    Patient Stated Goals eliminate the tingling    Currently in Pain? No/denies              [facility] PT Assessment - 05/13/20 0001      Assessment   Medical Diagnosis neck pain; cervical radiculopathy    Referring Provider (PT) Emeterio Reeve DO    Onset Date/Surgical Date 03/18/20    Hand Dominance Right    Next MD Visit next week    Prior Therapy yes - successful      Precautions   Precautions None      Restrictions   Weight Bearing Restrictions No    Other  Position/Activity Restrictions --      Balance Screen   Has the patient fallen in the past 6 months No    Has the patient had a decrease in activity level because of a fear of falling?  No    Is the patient reluctant to leave their home because of a fear of falling?  No      Home Environment   Living Environment Private residence      Observation/Other Assessments   Focus on Therapeutic Outcomes (FOTO)  30% limitations      Posture/Postural Control   Posture/Postural Control No significant limitations      ROM / Strength   AROM / PROM / Strength AROM;PROM;Strength      AROM   AROM Assessment Site Cervical    Cervical - Right Side Bend 20    Cervical - Left Side Bend 33    Cervical - Right Rotation 62    Cervical - Left Rotation 52      PROM   PROM Assessment Site Cervical    Cervical - Right Side Bend 28    Cervical - Left Side Bend 36      Strength  Overall Strength Comments left biceps 4/5, rt 5/5; bil shoulder ABD/FLEX 5/5    Strength Assessment Site Cervical    Cervical Flexion 4/5    Cervical Extension 4+/5    Cervical - Right Side Bend 5/5    Cervical - Left Side Bend 4/5    Cervical - Right Rotation 5/5    Cervical - Left Rotation 5/5      Palpation   Spinal mobility stiff with PA; lateral glides grossly WNL    Palpation comment increased tissue tension in cervical paraspinals      Special Tests   Other special tests neg special tests                      Objective measurements completed on examination: See above findings.               PT Education - 05/13/20 1402    Education Details HEP    Person(s) Educated Patient    Methods Explanation;Demonstration;Handout    Comprehension Verbalized understanding;Returned demonstration               PT Long Term Goals - 05/13/20 1424      PT LONG TERM GOAL #1   Title I with advanced HEP    Time 6    Period Weeks    Status New    Target Date 06/24/20      PT LONG TERM  GOAL #2   Title report =/> 75% improvements in tingling in neck and shoulders    Time 6    Period Weeks    Status New      PT LONG TERM GOAL #3   Title pt to report decrease in tingling with driving by 38% or more    Time 6    Period Weeks    Status New      PT LONG TERM GOAL #4   Title improve FOTO to </= 27% limitation    Time 6    Period Weeks    Status New      PT LONG TERM GOAL #5   Title improved cervical strength to 5/5    Time 6    Period Weeks    Status New                  Plan - 05/13/20 1405    Clinical Impression Statement Patient presents with c/o of bil tingling into shoulders beginning about 2 months ago. He denies pain. He has mod to severe foramenal stenosis at multilevels; worse at C3/4 and C4/5. He is tight in his cervical muscles restricting ROM and he has weakness in his neck as well. He will benefit from PT to address these deficits.    Personal Factors and Comorbidities Comorbidity 3+    Comorbidities DM, OA, HTN, Essential tremor    Examination-Activity Limitations Other   driving   Stability/Clinical Decision Making Stable/Uncomplicated    Clinical Decision Making Low    Rehab Potential Good    PT Frequency 2x / week    PT Duration 6 weeks    PT Treatment/Interventions ADLs/Self Care Home Management;Moist Heat;Cryotherapy;Electrical Stimulation;Neuromuscular re-education;Therapeutic exercise;Therapeutic activities;Patient/family education;Manual techniques;Dry needling;Spinal Manipulations    PT Next Visit Plan cervical ROM and strength; cerv stab/postural strength    PT Home Exercise Plan [email or web address]    Consulted and Agree with Plan of Care Patient           Patient will benefit from skilled therapeutic intervention in  order to improve the following deficits and impairments:  Decreased range of motion, Increased muscle spasms, Impaired flexibility, Hypomobility, Decreased strength  Visit Diagnosis: Cervicalgia - Plan: PT plan of care  cert/re-cert  Radiculopathy, cervical region - Plan: PT plan of care cert/re-cert     Problem List Patient Active Problem List   Diagnosis Date Noted   Neuroforaminal stenosis of cervical spine 04/26/2018   Degenerative disc disease, cervical 04/26/2018   Left hip pain 04/26/2018   Cervical radiculitis 07/09/2014   Ganglion cyst 03/23/2014   Encounter for monitoring statin therapy 02/24/2013   Benign essential tremor 08/31/2011   Hyperlipemia 08/31/2011   Type 2 diabetes mellitus ([place]) 01/07/2010   COLONIC POLYPS 01/01/2009   DIVERTICULOSIS OF COLON 01/01/2009   Former smoker 12/14/2008    Madelyn Flavors PT 05/13/2020, 2:29 PM  [place] [address] [place], [place], [address] Phone: [phone]   Fax:  [phone]  Name: Patrick Perkins MRN: [id number] Date of Birth: 1946-08-24

## 2020-05-17 ENCOUNTER — Ambulatory Visit (INDEPENDENT_AMBULATORY_CARE_PROVIDER_SITE_OTHER): Payer: Medicare PPO | Admitting: Physical Therapy

## 2020-05-17 ENCOUNTER — Other Ambulatory Visit: Payer: Self-pay

## 2020-05-17 DIAGNOSIS — M542 Cervicalgia: Secondary | ICD-10-CM

## 2020-05-17 DIAGNOSIS — M5412 Radiculopathy, cervical region: Secondary | ICD-10-CM

## 2020-05-17 NOTE — Therapy (Signed)
[place], [place], [address] Phone: [phone]   Fax:  [phone]  Physical Therapy Treatment  Patient Details  Name: Patrick Perkins MRN: [id number] Date of Birth: [birth year] Referring Provider (PT): Emeterio Reeve DO   Encounter Date: 05/17/2020   PT End of Session - 05/17/20 1527    Visit Number 2    Number of Visits 12    Date for PT Re-Evaluation 06/24/20    Authorization Type [employer] MCR    Authorization Time Period 05/13/20-06/24/20    Authorization - Visit Number 2    Authorization - Number of Visits 12    Progress Note Due on Visit 10    PT Start Time 1519    PT Stop Time 9381    PT Time Calculation (min) 38 min    Activity Tolerance Patient tolerated treatment well;No increased pain    Behavior During Therapy WFL for tasks assessed/performed           Past Medical History:  Diagnosis Date  . Diabetes mellitus   . ED (erectile dysfunction)     Past Surgical History:  Procedure Laterality Date  . HERNIA REPAIR  2005   double    There were no vitals filed for this visit.   Subjective Assessment - 05/17/20 1524    Subjective Pt reports he has no symptoms when working in yard or golfing; driving bothers his and when he notices the tingling in his neck and into Lt arm.    Diagnostic tests MRI - multilevel moderate to severe stenosis worse at C3/4 and C4/5    Patient Stated Goals eliminate the tingling    Currently in Pain? Yes    Pain Score 1     Pain Location Neck    Pain Orientation Right;Left    Pain Descriptors / Indicators Tingling    Aggravating Factors  driving    Pain Relieving Factors getting into certain positions.              [facility] PT Assessment - 05/17/20 0001      Assessment   Medical Diagnosis neck pain; cervical radiculopathy    Referring Provider (PT) Emeterio Reeve DO    Onset Date/Surgical Date 03/18/20    Hand Dominance Right    Next MD  Visit next week    Prior Therapy yes - successful      AROM   Cervical - Right Side Bend 40    Cervical - Left Side Bend 40    Cervical - Right Rotation 55    Cervical - Left Rotation 48             [facility] Adult PT Treatment/Exercise - 05/17/20 0001      Neck Exercises: Machines for Strengthening   UBE (Upper Arm Bike) L2: 1 min each direction, standing      Neck Exercises: Seated   Cervical Rotation Right;Left   2 reps each side.    Shoulder Rolls Forwards;5 reps    Other Seated Exercise reviewed isometrics from HEP x 3 reps each - flexion, and lateral flexion       Neck Exercises: Supine   Other Supine Exercise head press x 5 sec x 5 reps;    Other Supine Exercise   prolonged stretch with arms in T for pec stretch x 60 sec x 2 reps       Shoulder Exercises: Stretch   Other Shoulder Stretches 3 position doorway stretch x 20 sec  x 2 reps each       Manual Therapy   Manual Therapy Soft tissue mobilization;Taping    Manual therapy comments pt seated     Soft tissue mobilization IASTM to bilat upper trap, scalene and lower cspine/upper Tspine paraspinals to decrease fascial restrictions and improve ROM.     Kinesiotex Facilitate Muscle      Kinesiotix   Facilitate Muscle  I strips of sensitive skin Rock tape applied along cervical paraspinals with 10% stretch, I strip of sensitive skin tape applied perpendicular across C7/T1 - to increase postural awareness.        Neck Exercises: Stretches   Other Neck Stretches lateral neck flexion with chin tuck x 10 sec x 2 reps each side.     Other Neck Stretches SCM stretch x 10 sec x 3 reps each side.                        PT Long Term Goals - 05/13/20 1424      PT LONG TERM GOAL #1   Title I with advanced HEP    Time 6    Period Weeks    Status New    Target Date 06/24/20      PT LONG TERM GOAL #2   Title report =/> 75% improvements in tingling in neck and shoulders    Time 6    Period Weeks    Status New        PT LONG TERM GOAL #3   Title pt to report decrease in tingling with driving by 25% or more    Time 6    Period Weeks    Status New      PT LONG TERM GOAL #4   Title improve FOTO to </= 27% limitation    Time 6    Period Weeks    Status New      PT LONG TERM GOAL #5   Title improved cervical strength to 5/5    Time 6    Period Weeks    Status New                 Plan - 05/17/20 1558    Clinical Impression Statement Lateral cervical flexion has improved, rotation had worsened.  Pt reported elimination of neck pain after completion of treatment.  Trial of sensitive skin Rock tape applied to neck to assist increasing postural awareness. Encouraged pt to get some lumbar support (towel/pillow) in car to assist with alignment.  No goals met yet; only 2nd visit.    Personal Factors and Comorbidities Comorbidity 3+    Comorbidities DM, OA, HTN, Essential tremor    Examination-Activity Limitations Other   driving   Stability/Clinical Decision Making Stable/Uncomplicated    Rehab Potential Good    PT Frequency 2x / week    PT Duration 6 weeks    PT Treatment/Interventions ADLs/Self Care Home Management;Moist Heat;Cryotherapy;Electrical Stimulation;Neuromuscular re-education;Therapeutic exercise;Therapeutic activities;Patient/family education;Manual techniques;Dry needling;Spinal Manipulations    PT Next Visit Plan cervical ROM and strength; cerv stab/postural strength    PT Home Exercise Plan [email or web address]    Consulted and Agree with Plan of Care Patient           Patient will benefit from skilled therapeutic intervention in order to improve the following deficits and impairments:  Decreased range of motion, Increased muscle spasms, Impaired flexibility, Hypomobility, Decreased strength  Visit Diagnosis: Cervicalgia  Radiculopathy, cervical region     Problem List  Patient Active Problem List   Diagnosis Date Noted  . Neuroforaminal stenosis of cervical spine 04/26/2018   . Degenerative disc disease, cervical 04/26/2018  . Left hip pain 04/26/2018  . Cervical radiculitis 07/09/2014  . Ganglion cyst 03/23/2014  . Encounter for monitoring statin therapy 02/24/2013  . Benign essential tremor 08/31/2011  . Hyperlipemia 08/31/2011  . Type 2 diabetes mellitus ([place]) 01/07/2010  . COLONIC POLYPS 01/01/2009  . DIVERTICULOSIS OF COLON 01/01/2009  . Former smoker 12/14/2008   Kerin Perna, PTA 05/17/20 [facility] [place], [place], [address] Phone: [phone]   Fax:  [phone]  Name: MICCO BOURBEAU MRN: [id number] Date of Birth: [birth year]

## 2020-05-18 ENCOUNTER — Ambulatory Visit (INDEPENDENT_AMBULATORY_CARE_PROVIDER_SITE_OTHER): Payer: Medicare PPO | Admitting: Osteopathic Medicine

## 2020-05-18 ENCOUNTER — Encounter: Payer: Self-pay | Admitting: Osteopathic Medicine

## 2020-05-18 ENCOUNTER — Telehealth: Payer: Self-pay | Admitting: Osteopathic Medicine

## 2020-05-18 VITALS — BP 124/77 | HR 66 | Wt 184.0 lb

## 2020-05-18 DIAGNOSIS — M5412 Radiculopathy, cervical region: Secondary | ICD-10-CM | POA: Diagnosis not present

## 2020-05-18 DIAGNOSIS — E119 Type 2 diabetes mellitus without complications: Secondary | ICD-10-CM

## 2020-05-18 DIAGNOSIS — I10 Essential (primary) hypertension: Secondary | ICD-10-CM | POA: Diagnosis not present

## 2020-05-18 LAB — POCT GLYCOSYLATED HEMOGLOBIN (HGB A1C): Hemoglobin A1C: 6.8 % — AB (ref 4.0–5.6)

## 2020-05-18 NOTE — Progress Notes (Signed)
Patrick Perkins is a 74 y.o. male who presents to  [facility]  today, 05/18/20, seeking care for the following:  . DM2 follow-up, A1C at goal today no hypoglycemia   Neck pain follow-up - going to PT and doing well, I referred to neurosurgery 05/03/20 (MRI showed Multilevel moderate to severe neural foraminal stenosis, worst at C3-4 and C4-5. Mild spinal canal stenosis at C4-5 and C5-6.)and pt has heard back from them   HTN BP check - home BP at goal 494W systolic, no dizziness      ASSESSMENT & PLAN with other pertinent findings:  The primary encounter diagnosis was Type 2 diabetes mellitus without complication, without long-term current use of insulin ([place]). Diagnoses of Cervical radiculopathy and Essential hypertension were also pertinent to this visit.   Results for orders placed or performed in visit on 05/18/20 (from the past 24 hour(s))  POCT HgB A1C     Status: Abnormal   Collection Time: 05/18/20  8:59 AM  Result Value Ref Range   Hemoglobin A1C 6.8 (A) 4.0 - 5.6 %   HbA1c POC (<> result, manual entry)     HbA1c, POC (prediabetic range)     HbA1c, POC (controlled diabetic range)     1. Type 2 diabetes mellitus without complication, without long-term current use of insulin (HCC) A1C is at goal. Continue current Rx: metformin XR 500 mg daily.  BP at goal, will continue ACE/ARB w/ Enalapril. LDL close to goal at 85 in 08/2019 continue Lovastatin. Pneumovax and COVID vaccines good!   2. Cervical radiculopathy Message to referral coordinator to follow-up PT helping some, has had 2 sessions so far  No worsening symptoms   3. HTN At goal  BP Readings from Last 3 Encounters:  05/18/20 124/77  04/23/20 (!) 146/84  02/17/20 124/77      There are no Patient Instructions on file for this visit.  Orders Placed This Encounter  Procedures  . POCT HgB A1C    No orders of the defined types were placed in this  encounter.      Follow-up instructions: Return for MEDICARE WELLNESS W/ NURSE 08/2019, LABS (FASTING). FOLLOW W/ DR A FOR PHYSICAL 2-3 DAYS AFTER.                                         BP 124/77 (BP Location: Right Arm, Patient Position: Sitting)   Pulse 66   Wt 184 lb (83.5 kg)   SpO2 98%   BMI 27.98 kg/m   Current Meds  Medication Sig  . AMBULATORY NON FORMULARY MEDICATION One Touch Verio glucose monitor test strips and lancets.  Check blood sugar daily. Dx type 2 diabetes. E11.9  . aspirin 81 MG tablet Take 81 mg by mouth daily.    . enalapril (VASOTEC) 10 MG tablet Take 1 tablet by mouth once daily  . glucose blood (ACCU-CHEK AVIVA) test strip Test blood sugar twice daily. Dx: E11.9  . lovastatin (MEVACOR) 40 MG tablet Take 0.5 tablets (20 mg total) by mouth at bedtime.  . metFORMIN (GLUCOPHAGE XR) 500 MG 24 hr tablet Take 1 tablet (500 mg total) by mouth daily with breakfast.  . Multiple Vitamins-Minerals (MULTIVITAMIN PO) Take by mouth.  . primidone (MYSOLINE) 250 MG tablet Take 250 mg by mouth at bedtime.  . primidone (MYSOLINE) 50 MG tablet Take by  mouth at bedtime.    Results for orders placed or performed in visit on 05/18/20 (from the past 72 hour(s))  POCT HgB A1C     Status: Abnormal   Collection Time: 05/18/20  8:59 AM  Result Value Ref Range   Hemoglobin A1C 6.8 (A) 4.0 - 5.6 %   HbA1c POC (<> result, manual entry)     HbA1c, POC (prediabetic range)     HbA1c, POC (controlled diabetic range)      No results found.     All questions at time of visit were answered - patient instructed to contact office with any additional concerns or updates.  ER/RTC precautions were reviewed with the patient as applicable.   Please note: voice recognition software was used to produce this document, and typos may escape review. Please contact Dr. Sheppard Coil for any needed clarifications.

## 2020-05-18 NOTE — Telephone Encounter (Signed)
Referral made 05/03/20 to neurosurgery pt has not heard back about this can we follow up? Thanks

## 2020-05-18 NOTE — Telephone Encounter (Signed)
Referral request has not been completed. Referral did not transfer properly into referral queue log to be initiated. The information has been submitted to [facility] for processing. Thanks.

## 2020-05-18 NOTE — Telephone Encounter (Signed)
I sent referral to [facility] they will call and schedule with patient for good appointment time - CF

## 2020-05-21 ENCOUNTER — Ambulatory Visit (INDEPENDENT_AMBULATORY_CARE_PROVIDER_SITE_OTHER): Payer: Medicare PPO | Admitting: Rehabilitative and Restorative Service Providers"

## 2020-05-21 ENCOUNTER — Other Ambulatory Visit: Payer: Self-pay

## 2020-05-21 ENCOUNTER — Encounter: Payer: Self-pay | Admitting: Rehabilitative and Restorative Service Providers"

## 2020-05-21 DIAGNOSIS — R293 Abnormal posture: Secondary | ICD-10-CM | POA: Diagnosis not present

## 2020-05-21 DIAGNOSIS — M5412 Radiculopathy, cervical region: Secondary | ICD-10-CM | POA: Diagnosis not present

## 2020-05-21 DIAGNOSIS — M542 Cervicalgia: Secondary | ICD-10-CM

## 2020-05-21 NOTE — Patient Instructions (Signed)
Access Code: [email or web address] URL: [email or web address] Date: 05/21/2020 Prepared by: Rudell Cobb  Exercises Seated Upper Trapezius Stretch - 2 x daily - 7 x weekly - 3 reps - 1 sets - 30-60 sec hold First Rib Mobilization with Strap - 2 x daily - 7 x weekly - 1 sets - 3 reps - 30 seconds hold Sternocleidomastoid Stretch - 2 x daily - 7 x weekly - 1 sets - 3 reps - 30-60 hold Supine Cervical Rotation PROM - 2 x daily - 7 x weekly - 1 sets - 5 reps - 10 sec hold Standing Isometric Cervical Sidebending with Manual Resistance - 1 x daily - 7 x weekly - 5 reps - 1 sets - 10 sec hold hold Standing Isometric Cervical Flexion with Manual Resistance - 1 x daily - 7 x weekly - 5 reps - 1 sets - 10 sec hold Standing Shoulder Row with Anchored Resistance - 2 x daily - 7 x weekly - 1 sets - 10 reps Prone Scapular Retraction - 2 x daily - 7 x weekly - 1 sets - 10 reps Prone W Scapular Retraction - 2 x daily - 7 x weekly - 1 sets - 10 reps

## 2020-05-21 NOTE — Therapy (Signed)
[facility] [place], [place], [address] Phone: [phone]   Fax:  [phone]  Physical Therapy Treatment  Patient Details  Name: Patrick Perkins MRN: [id number] Date of Birth: 1946-09-02 Referring Provider (PT): Emeterio Reeve DO   Encounter Date: 05/21/2020   PT End of Session - 05/21/20 1231    Visit Number 3    Number of Visits 12    Date for PT Re-Evaluation 06/24/20    Authorization Type [employer] MCR    Authorization Time Period 05/13/20-06/24/20    Authorization - Visit Number 3    Authorization - Number of Visits 12    Progress Note Due on Visit 10    PT Start Time 1150    PT Stop Time 1228    PT Time Calculation (min) 38 min    Activity Tolerance Patient tolerated treatment well;No increased pain    Behavior During Therapy WFL for tasks assessed/performed           Past Medical History:  Diagnosis Date  . Diabetes mellitus   . ED (erectile dysfunction)     Past Surgical History:  Procedure Laterality Date  . HERNIA REPAIR  2005   double    There were no vitals filed for this visit.   Subjective Assessment - 05/21/20 1153    Subjective The patient has bilateral tingling in upper trap and shoulder region.    Pertinent History DM, OA, HTN, essential tremor    Patient Stated Goals eliminate the tingling    Currently in Pain? Yes    Pain Score 1     Pain Location Neck    Pain Orientation Right;Left    Pain Descriptors / Indicators Tingling    Pain Type Chronic pain    Pain Onset More than a month ago    Pain Frequency Intermittent    Aggravating Factors  driving    Pain Relieving Factors certain positions              [facility] PT Assessment - 05/21/20 1154      Assessment   Medical Diagnosis neck pain; cervical radiculopathy    Referring Provider (PT) Emeterio Reeve DO    Onset Date/Surgical Date 03/18/20    Hand Dominance Right    Prior Therapy yes - successful                           [facility] Adult PT Treatment/Exercise - 05/21/20 1155      Self-Care   Self-Care Other Self-Care Comments    Other Self-Care Comments  discussed driving posture      Exercises   Exercises Neck      Neck Exercises: Machines for Strengthening   UBE (Upper Arm Bike) L2 1.5 min each direction      Neck Exercises: Standing   Other Standing Exercises wall elbow lean with thoracic opening R and L    Other Standing Exercises door frame stretch in standing      Neck Exercises: Seated   Other Seated Exercise first rib depression seated      Neck Exercises: Supine   Neck Retraction 5 reps;5 secs    Capital Flexion 5 reps;5 secs    Capital Flexion Limitations "lift and hover above the pillow."    Cervical Rotation Right;Left    Cervical Rotation Limitations with passive overpressure    Lateral Flexion --      Neck Exercises: Sidelying   Lateral  Flexion Right;Left;5 reps      Neck Exercises: Prone   Upper Extremity Flexion with Stabilization Flexion;10 reps    UE Flexion with Stabilization Limitations on elbows alternating UEs    Other Prone Exercise prone scapular retraction/protraction      Manual Therapy   Manual Therapy Soft tissue mobilization;Myofascial release;Manual Traction    Manual therapy comments supine    Soft tissue mobilization STM for upper trap and scalenes    Myofascial Release upper trapezius    Manual Traction gentle manual traction supine                  PT Education - 05/21/20 1227    Education Details HEP adding prone retraction and first rib mobilization    Person(s) Educated Patient    Methods Explanation;Demonstration;Handout    Comprehension Returned demonstration;Verbalized understanding               PT Long Term Goals - 05/13/20 1424      PT LONG TERM GOAL #1   Title I with advanced HEP    Time 6    Period Weeks    Status New    Target Date 06/24/20      PT LONG TERM GOAL #2   Title report =/>  75% improvements in tingling in neck and shoulders    Time 6    Period Weeks    Status New      PT LONG TERM GOAL #3   Title pt to report decrease in tingling with driving by 96% or more    Time 6    Period Weeks    Status New      PT LONG TERM GOAL #4   Title improve FOTO to </= 27% limitation    Time 6    Period Weeks    Status New      PT LONG TERM GOAL #5   Title improved cervical strength to 5/5    Time 6    Period Weeks    Status New                 Plan - 05/21/20 1232    Clinical Impression Statement The patient arrived with tingling, that improved throughout our session.  During ther ex, he substitutes upper trap and scalenes elevating scapulae.  PT worked on ther ex for scapular stabilization.  Row with band increases shoulder shrug, therefore did not provide for HEP.  Continue working ot [employer].    Personal Factors and Comorbidities Comorbidity 3+    Comorbidities DM, OA, HTN, Essential tremor    Examination-Activity Limitations Other   driving   Stability/Clinical Decision Making Stable/Uncomplicated    Rehab Potential Good    PT Frequency 2x / week    PT Duration 6 weeks    PT Treatment/Interventions ADLs/Self Care Home Management;Moist Heat;Cryotherapy;Electrical Stimulation;Neuromuscular re-education;Therapeutic exercise;Therapeutic activities;Patient/family education;Manual techniques;Dry needling;Spinal Manipulations    PT Next Visit Plan cervical ROM and strength; cerv stab/postural strength    PT Home Exercise Plan [id number]    Consulted and Agree with Plan of Care Patient           Patient will benefit from skilled therapeutic intervention in order to improve the following deficits and impairments:  Decreased range of motion, Increased muscle spasms, Impaired flexibility, Hypomobility, Decreased strength  Visit Diagnosis: Cervicalgia  Radiculopathy, cervical region  Abnormal posture     Problem List Patient Active Problem List    Diagnosis Date Noted  . Neuroforaminal stenosis  of cervical spine 04/26/2018  . Degenerative disc disease, cervical 04/26/2018  . Left hip pain 04/26/2018  . Cervical radiculitis 07/09/2014  . Ganglion cyst 03/23/2014  . Encounter for monitoring statin therapy 02/24/2013  . Benign essential tremor 08/31/2011  . Hyperlipemia 08/31/2011  . Type 2 diabetes mellitus ([place]) 01/07/2010  . COLONIC POLYPS 01/01/2009  . DIVERTICULOSIS OF COLON 01/01/2009  . Former smoker 12/14/2008    Patrick Perkins, [place] 05/21/2020, 12:34 PM  [facility] [place], [place], [address] Phone: [phone]   Fax:  [phone]  Name: Patrick Perkins MRN: [id number] Date of Birth: Mar 22, 1946

## 2020-05-22 ENCOUNTER — Emergency Department (INDEPENDENT_AMBULATORY_CARE_PROVIDER_SITE_OTHER)
Admission: EM | Admit: 2020-05-22 | Discharge: 2020-05-22 | Disposition: A | Discharge status: Home / Self Care | Payer: Medicare PPO | Source: Home / Self Care | Attending: Family Medicine | Admitting: Family Medicine

## 2020-05-22 ENCOUNTER — Other Ambulatory Visit: Payer: Self-pay

## 2020-05-22 ENCOUNTER — Emergency Department (INDEPENDENT_AMBULATORY_CARE_PROVIDER_SITE_OTHER): Payer: Medicare PPO

## 2020-05-22 DIAGNOSIS — R1032 Left lower quadrant pain: Secondary | ICD-10-CM

## 2020-05-22 DIAGNOSIS — K469 Unspecified abdominal hernia without obstruction or gangrene: Secondary | ICD-10-CM | POA: Diagnosis not present

## 2020-05-22 LAB — POCT CBC W AUTO DIFF (K'VILLE URGENT CARE)

## 2020-05-22 LAB — POCT URINALYSIS DIP (MANUAL ENTRY)
Bilirubin, UA: NEGATIVE
Blood, UA: NEGATIVE
Glucose, UA: NEGATIVE mg/dL
Ketones, POC UA: NEGATIVE mg/dL
Leukocytes, UA: NEGATIVE
Nitrite, UA: NEGATIVE
Protein Ur, POC: NEGATIVE mg/dL
Spec Grav, UA: 1.015 (ref 1.010–1.025)
Urobilinogen, UA: 0.2 E.U./dL
pH, UA: 6 (ref 5.0–8.0)

## 2020-05-22 MED ORDER — AMOXICILLIN-POT CLAVULANATE 875-125 MG PO TABS
ORAL_TABLET | ORAL | 0 refills | Status: DC
Start: 2020-05-22 — End: 2020-09-07

## 2020-05-22 NOTE — ED Triage Notes (Signed)
Pt c/o left sided lower abdominal pain since Wed. First thought it might be constipation, but has had a bowel movement recently. Denies N/V/D or fever.

## 2020-05-22 NOTE — ED Provider Notes (Signed)
[facility]    CSN: [id number] Arrival date & time: 05/22/20  1352      History   Chief Complaint Chief Complaint  Patient presents with  . Abdominal Pain    LT lower    HPI Patrick Perkins is a 74 y.o. male.   Patient awoke four days ago with left lower quadrant abdominal pain.  The pain is relatively mild, does not radiate, but has been constant.  He though it might be constipation, but he has had a normal bowel movement.  He feels well otherwise with no nausea/vomiting, fevers, chills, and sweats, and changes in bowel movements.  He has had mild improvement during the past two day by switching to a clear liquid diet. Past surgical history of bilateral inguinal hernia repair.  He had a screening colonoscopy in November 2020 that showed diverticulosis. Family history includes diverticulosis in his sister, and colon CA in his father.   The history is provided by the patient.  Abdominal Pain Pain location:  LLQ Pain quality: aching, bloating and fullness   Pain radiates to:  Does not radiate Pain severity:  Mild Onset quality:  Sudden Duration:  4 days Timing:  Constant Progression:  Unchanged Chronicity:  New Context: awakening from sleep and previous surgery   Context: not diet changes, not eating, not laxative use, not recent illness, not recent travel, not sick contacts, not suspicious food intake and not trauma   Relieved by:  Nothing Worsened by:  Eating, bowel movements and movement Ineffective treatments:  Acetaminophen Associated symptoms: no anorexia, no belching, no chest pain, no chills, no constipation, no cough, no diarrhea, no dysuria, no fatigue, no fever, no flatus, no hematemesis, no hematochezia, no hematuria, no melena, no nausea, no shortness of breath, no sore throat and no vomiting     Past Medical History:  Diagnosis Date  . Diabetes mellitus   . ED (erectile dysfunction)     Patient Active Problem List   Diagnosis Date Noted  .  Neuroforaminal stenosis of cervical spine 04/26/2018  . Degenerative disc disease, cervical 04/26/2018  . Left hip pain 04/26/2018  . Cervical radiculitis 07/09/2014  . Ganglion cyst 03/23/2014  . Encounter for monitoring statin therapy 02/24/2013  . Benign essential tremor 08/31/2011  . Hyperlipemia 08/31/2011  . Type 2 diabetes mellitus ([place]) 01/07/2010  . COLONIC POLYPS 01/01/2009  . DIVERTICULOSIS OF COLON 01/01/2009  . Former smoker 12/14/2008    Past Surgical History:  Procedure Laterality Date  . HERNIA REPAIR  2005   double       Home Medications    Prior to Admission medications   Medication Sig Start Date End Date Taking? Authorizing Provider  AMBULATORY NON FORMULARY MEDICATION One Touch Verio glucose monitor test strips and lancets.  Check blood sugar daily. Dx type 2 diabetes. E11.9 03/27/16   Marcial Pacas, DO  amoxicillin-clavulanate (AUGMENTIN) 875-125 MG tablet Take one tab PO Q8hr 05/22/20   Kandra Nicolas, MD  aspirin 81 MG tablet Take 81 mg by mouth daily.      [provider]  azelastine (ASTELIN) 0.1 % nasal spray Place 2 sprays into both nostrils 2 (two) times daily. Use in each nostril as directed Patient not taking: Reported on 05/13/2020 01/14/19   Gregor Hams, MD  enalapril (VASOTEC) 10 MG tablet Take 1 tablet by mouth once daily 03/19/20   Emeterio Reeve, DO  glucose blood (ACCU-CHEK AVIVA) test strip Test blood sugar twice daily. Dx: E11.9 01/22/20  Emeterio Reeve, DO  lovastatin (MEVACOR) 40 MG tablet Take 0.5 tablets (20 mg total) by mouth at bedtime. 09/05/19   Emeterio Reeve, DO  metFORMIN (GLUCOPHAGE XR) 500 MG 24 hr tablet Take 1 tablet (500 mg total) by mouth daily with breakfast. 09/05/19 09/04/20  Emeterio Reeve, DO  Multiple Vitamins-Minerals (MULTIVITAMIN PO) Take by mouth.    [provider]  primidone (MYSOLINE) 250 MG tablet Take 250 mg by mouth at bedtime.    [provider]  primidone (MYSOLINE)  50 MG tablet Take by mouth at bedtime.    [provider]    Family History Family History  Problem Relation Age of Onset  . Cancer Father 53       colon    Social History Social History   Tobacco Use  . Smoking status: Former Smoker    Quit date: 09/30/2009    Years since quitting: 10.6  . Smokeless tobacco: Never Used  Vaping Use  . Vaping Use: Never used  Substance Use Topics  . Alcohol use: Yes    Alcohol/week: 1.0 standard drink    Types: 1 Cans of beer per week    Comment: occasionally  . Drug use: Never     Allergies   Patient has no known allergies.   Review of Systems Review of Systems  Constitutional: Negative for activity change, appetite change, chills, diaphoresis, fatigue and fever.  HENT: Negative for sore throat.   Respiratory: Negative for cough and shortness of breath.   Cardiovascular: Negative for chest pain.  Gastrointestinal: Positive for abdominal pain. Negative for anorexia, blood in stool, constipation, diarrhea, flatus, hematemesis, hematochezia, melena, nausea and vomiting.  Genitourinary: Negative for dysuria and hematuria.  All other systems reviewed and are negative.    Physical Exam Triage Vital Signs ED Triage Vitals  Enc Vitals Group     BP 05/22/20 1402 (!) 163/82     Pulse Rate 05/22/20 1402 72     Resp 05/22/20 1402 18     Temp 05/22/20 1402 98.4 F (36.9 C)     Temp Source 05/22/20 1402 Oral     SpO2 05/22/20 1402 98 %     Weight --      Height --      Head Circumference --      Peak Flow --      Pain Score 05/22/20 1403 4     Pain Loc --      Pain Edu? --      Excl. in [place]? --    No data found.  Updated Vital Signs BP (!) 163/82 (BP Location: Right Arm)   Pulse 72   Temp 98.4 F (36.9 C) (Oral)   Resp 18   SpO2 98%   Visual Acuity Right Eye Distance:   Left Eye Distance:   Bilateral Distance:    Right Eye Near:   Left Eye Near:    Bilateral Near:     Physical Exam Vitals and nursing note  reviewed.  Constitutional:      General: He is not in acute distress. HENT:     Head: Normocephalic.     Right Ear: External ear normal.     Left Ear: External ear normal.     Nose: Nose normal.     Mouth/Throat:     Mouth: Mucous membranes are moist.  Eyes:     Conjunctiva/sclera: Conjunctivae normal.     Pupils: Pupils are equal, round, and reactive to light.  Cardiovascular:  Heart sounds: Normal heart sounds.  Pulmonary:     Breath sounds: Normal breath sounds.  Abdominal:     General: Abdomen is flat. Bowel sounds are normal.     Palpations: Abdomen is soft. There is no hepatomegaly, splenomegaly or mass.     Tenderness: There is abdominal tenderness in the left lower quadrant. There is no right CVA tenderness, left CVA tenderness or guarding. Negative signs include psoas sign and obturator sign.     Hernia: No hernia is present. There is no hernia in the umbilical area or ventral area.    Musculoskeletal:     Cervical back: Neck supple.     Right lower leg: No edema.     Left lower leg: No edema.  Lymphadenopathy:     Cervical: No cervical adenopathy.  Skin:    General: Skin is warm.     Findings: No rash.  Neurological:     Mental Status: He is alert and oriented to person, place, and time.      UC Treatments / Results  Labs (all labs ordered are listed, but only abnormal results are displayed) Labs Reviewed  POCT CBC W AUTO DIFF ([facility]):  WBC 8.2; LY 28.8; MO 5.1; GR 66.1; Hgb 13.6; Platelets 324   POCT URINALYSIS DIP (MANUAL ENTRY)    EKG   Radiology DG Abd 2 Views  Result Date: 05/22/2020 CLINICAL DATA:  Right lower quadrant pain for a few days. EXAM: ABDOMEN - 2 VIEW COMPARISON:  None. FINDINGS: The bowel gas pattern is normal. There is no evidence of free air. No radio-opaque calculi or other significant radiographic abnormality is seen. Mesh from prior hernia repair and surgical clips in the pelvis noted. IMPRESSION: No acute  abnormality. Electronically Signed   By: Inge Rise M.D.   On: 05/22/2020 15:38    Procedures Procedures (including critical care time)  Medications Ordered in UC Medications - No data to display  Initial Impression / Assessment and Plan / UC Course  I have reviewed the triage vital signs and the nursing notes.  Pertinent labs & imaging results that were available during my care of the patient were reviewed by me and considered in my medical decision making (see chart for details).  Clinical Course as of May 22 1621  Sat May 22, 2020  1512 pH, UA: 6.0 [SB]    Clinical Course User Index [SB] Kandra Nicolas, MD   Normal white blood count and urinalysis reassuring. Suspect mild diverticulitis.  Begin Augmentin.   Followup with Family Doctor if not improved in about 5 days.   Final Clinical Impressions(s) / UC Diagnoses   Final diagnoses:  Left lower quadrant abdominal pain     Discharge Instructions     Begin clear liquids for about 18 to 24 hours, then may begin a [employer] (Bananas, Rice, Applesauce, Toast) when improved. Then gradually advance to a regular diet as tolerated.    If symptoms become significantly worse during the night or over the weekend, proceed to the local emergency room.     ED Prescriptions    Medication Sig Dispense Auth. Provider   amoxicillin-clavulanate (AUGMENTIN) 875-125 MG tablet Take one tab PO Q8hr 21 tablet Kandra Nicolas, MD        Kandra Nicolas, MD 05/24/20 [phone]

## 2020-05-22 NOTE — Discharge Instructions (Addendum)
Begin clear liquids for about 18 to 24 hours, then may begin a [employer] (Bananas, Rice, Applesauce, Toast) when improved. Then gradually advance to a regular diet as tolerated.    If symptoms become significantly worse during the night or over the weekend, proceed to the local emergency room.

## 2020-05-25 ENCOUNTER — Encounter: Payer: Medicare PPO | Admitting: Physical Therapy

## 2020-05-27 ENCOUNTER — Other Ambulatory Visit: Payer: Self-pay

## 2020-05-27 ENCOUNTER — Encounter: Payer: Self-pay | Admitting: Physical Therapy

## 2020-05-27 ENCOUNTER — Ambulatory Visit (INDEPENDENT_AMBULATORY_CARE_PROVIDER_SITE_OTHER): Payer: Medicare PPO | Admitting: Physical Therapy

## 2020-05-27 DIAGNOSIS — M5412 Radiculopathy, cervical region: Secondary | ICD-10-CM

## 2020-05-27 DIAGNOSIS — M542 Cervicalgia: Secondary | ICD-10-CM | POA: Diagnosis not present

## 2020-05-27 DIAGNOSIS — R293 Abnormal posture: Secondary | ICD-10-CM | POA: Diagnosis not present

## 2020-05-27 DIAGNOSIS — G250 Essential tremor: Secondary | ICD-10-CM | POA: Diagnosis not present

## 2020-05-27 NOTE — Therapy (Signed)
[facility]  [place], [place], [address] Phone: [phone]   Fax:  [phone]  Physical Therapy Treatment  Patient Details  Name: Patrick Perkins MRN: [id number] Date of Birth: Apr 15, 1946 Referring Provider (PT): Emeterio Reeve DO   Encounter Date: 05/27/2020   PT End of Session - 05/27/20 1104    Visit Number 4    Number of Visits 12    Date for PT Re-Evaluation 06/24/20    Authorization Type [employer] MCR    Authorization Time Period 05/13/20-06/24/20    Authorization - Visit Number 4    Authorization - Number of Visits 12    Progress Note Due on Visit 10    PT Start Time 1101    PT Stop Time 1142    PT Time Calculation (min) 41 min    Activity Tolerance Patient tolerated treatment well;No increased pain    Behavior During Therapy WFL for tasks assessed/performed           Past Medical History:  Diagnosis Date  . Diabetes mellitus   . ED (erectile dysfunction)     Past Surgical History:  Procedure Laterality Date  . HERNIA REPAIR  2005   double    There were no vitals filed for this visit.   Subjective Assessment - 05/27/20 1105    Subjective Pt reports he had a case of diverticulitis last week. He is recovering, but feels weak.  He reports the tingling in his neck and shoulders has stopped, but he is very tight from "laying around the house"    Pertinent History DM, OA, HTN, essential tremor    Patient Stated Goals eliminate the tingling    Currently in Pain? No/denies    Pain Score 0-No pain    Pain Onset More than a month ago              [facility] PT Assessment - 05/27/20 0001      Assessment   Medical Diagnosis neck pain; cervical radiculopathy    Referring Provider (PT) Emeterio Reeve DO    Onset Date/Surgical Date 03/18/20    Hand Dominance Right    Prior Therapy yes - successful      AROM   Cervical Flexion 70    Cervical Extension 45    Cervical - Right Side Bend 45     Cervical - Left Side Bend 39    Cervical - Right Rotation 50    Cervical - Left Rotation 48      Strength   Cervical - Right Side Bend 5/5    Cervical - Left Side Bend 5/5           [facility] Adult PT Treatment/Exercise - 05/27/20 0001      Neck Exercises: Machines for Strengthening   UBE (Upper Arm Bike) L3 1.5 min each direction   seated     Neck Exercises: Standing   Other Standing Exercises wall elbow lean with thoracic opening R and L (rotation) x 5 reps each side.     Other Standing Exercises W's x 5 sec hold, 5 reps       Neck Exercises: Seated   Cervical Rotation Right;Left;5 reps    Lateral Flexion Right;Left;5 reps      Neck Exercises: Supine   Capital Flexion 5 reps;5 secs    Capital Flexion Limitations "lift and hover above the pillow."    Cervical Rotation Right;Left   3 reps each side, 10 sec hold.  Cervical Rotation Limitations with passive overpressure      Shoulder Exercises: Supine   Horizontal ABduction Both;10 reps    Theraband Level (Shoulder Horizontal ABduction) Level 3 (Green)    Diagonals Right;Left;10 reps    Theraband Level (Shoulder Diagonals) Level 2 (Red)      Shoulder Exercises: Stretch   Other Shoulder Stretches unilateral high doorway stretch x 20 sec each, midlevel stretch x 20 sec x 3 reps    Other Shoulder Stretches prolonged stretch with arms abdct 90 deg (supine) x 45 sec, x 30 sec      Manual Therapy   Manual therapy comments supine    Soft tissue mobilization STM to bilat cervical parapsinals, upper trap, scalenes, levator     Myofascial Release suboccipital release, upper trap    Manual Traction gentle manual traction supine                       PT Long Term Goals - 05/27/20 1110      PT LONG TERM GOAL #1   Title I with advanced HEP    Time 6    Period Weeks    Status On-going      PT LONG TERM GOAL #2   Title report =/> 75% improvements in tingling in neck and shoulders    Time 6    Period Weeks     Status On-going      PT LONG TERM GOAL #3   Title pt to report decrease in tingling with driving by 84% or more    Time 6    Period Weeks    Status On-going      PT LONG TERM GOAL #4   Title improve FOTO to </= 27% limitation    Time 6    Period Weeks    Status On-going      PT LONG TERM GOAL #5   Title improved cervical strength to 5/5    Time 6    Period Weeks    Status On-going                 Plan - 05/27/20 1123    Clinical Impression Statement Pt reporting no radicular symptoms since activity has decreased (due to recent illness); he will be more mindful to check if he has continued symptoms when driving. Pt reports he doesn't have a good place to perform prone exercises in HEP.  Pt tolerated all exercises today well, without increase in pain.  Pt reported slight reduction in tightness in neck at end of session.  Neck strength has improved, as has Rt lateral flexion ROM.    Personal Factors and Comorbidities Comorbidity 3+    Comorbidities DM, OA, HTN, Essential tremor    Examination-Activity Limitations Other   driving   Stability/Clinical Decision Making Stable/Uncomplicated    Rehab Potential Good    PT Frequency 2x / week    PT Duration 6 weeks    PT Treatment/Interventions ADLs/Self Care Home Management;Moist Heat;Cryotherapy;Electrical Stimulation;Neuromuscular re-education;Therapeutic exercise;Therapeutic activities;Patient/family education;Manual techniques;Dry needling;Spinal Manipulations    PT Next Visit Plan cervical ROM and strength; cerv stab/postural strength    PT Home Exercise Plan [id number]    Consulted and Agree with Plan of Care Patient           Patient will benefit from skilled therapeutic intervention in order to improve the following deficits and impairments:  Decreased range of motion, Increased muscle spasms, Impaired flexibility, Hypomobility, Decreased strength  Visit Diagnosis: Cervicalgia  Radiculopathy, cervical  region  Abnormal posture     Problem List Patient Active Problem List   Diagnosis Date Noted  . Neuroforaminal stenosis of cervical spine 04/26/2018  . Degenerative disc disease, cervical 04/26/2018  . Left hip pain 04/26/2018  . Cervical radiculitis 07/09/2014  . Ganglion cyst 03/23/2014  . Encounter for monitoring statin therapy 02/24/2013  . Benign essential tremor 08/31/2011  . Hyperlipemia 08/31/2011  . Type 2 diabetes mellitus ([place]) 01/07/2010  . COLONIC POLYPS 01/01/2009  . DIVERTICULOSIS OF COLON 01/01/2009  . Former smoker 12/14/2008   Kerin Perna, PTA 05/27/20 11:47 AM  [facility] [place], [place], [address] Phone: [phone]   Fax:  [phone]  Name: DAVIDE RISDON MRN: [id number] Date of Birth: 12/09/1945

## 2020-06-01 ENCOUNTER — Other Ambulatory Visit: Payer: Self-pay

## 2020-06-01 ENCOUNTER — Ambulatory Visit (INDEPENDENT_AMBULATORY_CARE_PROVIDER_SITE_OTHER): Payer: Medicare PPO | Admitting: Physical Therapy

## 2020-06-01 DIAGNOSIS — M5412 Radiculopathy, cervical region: Secondary | ICD-10-CM

## 2020-06-01 DIAGNOSIS — M542 Cervicalgia: Secondary | ICD-10-CM

## 2020-06-01 DIAGNOSIS — R293 Abnormal posture: Secondary | ICD-10-CM | POA: Diagnosis not present

## 2020-06-01 NOTE — Therapy (Signed)
[place], [place], [address] Phone: [phone]   Fax:  [phone]  Physical Therapy Treatment  Patient Details  Name: KALEN RATAJCZAK MRN: [id number] Date of Birth: 1946-03-27 Referring Provider (PT): Emeterio Reeve DO   Encounter Date: 06/01/2020   PT End of Session - 06/01/20 0938    Visit Number 5    Number of Visits 12    Date for PT Re-Evaluation 06/24/20    Authorization Type [employer] MCR    Authorization Time Period 05/13/20-06/24/20    Authorization - Visit Number 5    Authorization - Number of Visits 12    Progress Note Due on Visit 10    PT Start Time 1025    PT Stop Time 1015    PT Time Calculation (min) 41 min    Activity Tolerance Patient tolerated treatment well;No increased pain    Behavior During Therapy WFL for tasks assessed/performed           Past Medical History:  Diagnosis Date  . Diabetes mellitus   . ED (erectile dysfunction)     Past Surgical History:  Procedure Laterality Date  . HERNIA REPAIR  2005   double    There were no vitals filed for this visit.   Subjective Assessment - 06/01/20 0939    Subjective "Every once in a while I get a little tingle, but it's not nearly as consistent as it was".  He is on regular diet (after diverticultis flare), but hasn't regained his normal energy level.    Pertinent History DM, OA, HTN, essential tremor    Diagnostic tests MRI - multilevel moderate to severe stenosis worse at C3/4 and C4/5    Patient Stated Goals eliminate the tingling    Currently in Pain? No/denies    Pain Score 0-No pain   just stiffness on Lt   Pain Onset More than a month ago              [facility] PT Assessment - 06/01/20 0001      Assessment   Medical Diagnosis neck pain; cervical radiculopathy    Referring Provider (PT) Emeterio Reeve DO    Onset Date/Surgical Date 03/18/20    Hand Dominance Right    Prior Therapy yes -  successful             [facility] Adult PT Treatment/Exercise - 06/01/20 0001      Neck Exercises: Supine   Capital Flexion 10 reps;3 secs    Capital Flexion Limitations "lift and hover above the pillow."    Cervical Rotation Right;Left   3 reps each side, 10 sec hold.    Cervical Rotation Limitations with passive overpressure    Other Supine Exercise slight rotation Rt/Lt with chin nods x 5 reps each side.       Shoulder Exercises: Supine   Horizontal ABduction Both;10 reps    Theraband Level (Shoulder Horizontal ABduction) Level 3 (Green)    Diagonals Right;Left;10 reps    Theraband Level (Shoulder Diagonals) Level 3 (Green)      Shoulder Exercises: Psychologist, sport and exercise 2 reps;20 seconds    Other Shoulder Stretches thoracic ext over back of chair with head supported x 10 sec x 3 reps    Other Shoulder Stretches prolonged stretch with arms abdct 90 deg (supine) x 45 sec.  Thoracic ext over pool noodle in supine x 30 sec.        Manual Therapy  Soft tissue mobilization IASTM and STM to bilat upper trap, levator, scalene and lower cspine/upper Tspine paraspinals to decrease fascial restrictions and improve ROM.                        PT Long Term Goals - 06/01/20 0942      PT LONG TERM GOAL #1   Title I with advanced HEP    Time 6    Period Weeks    Status On-going      PT LONG TERM GOAL #2   Title report =/> 75% improvements in tingling in neck and shoulders    Baseline 50% improvement - 06/01/20    Time 6    Period Weeks    Status On-going      PT LONG TERM GOAL #3   Title pt to report decrease in tingling with driving by 94% or more    Time 6    Period Weeks    Status Partially Met      PT LONG TERM GOAL #4   Title improve FOTO to </= 27% limitation    Time 6    Period Weeks    Status On-going      PT LONG TERM GOAL #5   Title improved cervical strength to 5/5    Time 6    Period Weeks    Status On-going                 Plan -  06/01/20 1341    Clinical Impression Statement Pt reporting gradual improvement in neck tightness over last few sessions. Pt tolerated exercises well, without increase in pain, noting less tightness upon completion. Introduced new thoracic mobility stretches with good response. Progressing well towards goals.    Personal Factors and Comorbidities Comorbidity 3+    Comorbidities DM, OA, HTN, Essential tremor    Examination-Activity Limitations Other   driving   Stability/Clinical Decision Making Stable/Uncomplicated    Rehab Potential Good    PT Frequency 2x / week    PT Duration 6 weeks    PT Treatment/Interventions ADLs/Self Care Home Management;Moist Heat;Cryotherapy;Electrical Stimulation;Neuromuscular re-education;Therapeutic exercise;Therapeutic activities;Patient/family education;Manual techniques;Dry needling;Spinal Manipulations    PT Next Visit Plan cervical ROM and strength; cerv stab/postural strength    PT Home Exercise Plan [email or web address]    Consulted and Agree with Plan of Care Patient           Patient will benefit from skilled therapeutic intervention in order to improve the following deficits and impairments:  Decreased range of motion, Increased muscle spasms, Impaired flexibility, Hypomobility, Decreased strength  Visit Diagnosis: Cervicalgia  Radiculopathy, cervical region  Abnormal posture     Problem List Patient Active Problem List   Diagnosis Date Noted  . Neuroforaminal stenosis of cervical spine 04/26/2018  . Degenerative disc disease, cervical 04/26/2018  . Left hip pain 04/26/2018  . Cervical radiculitis 07/09/2014  . Ganglion cyst 03/23/2014  . Encounter for monitoring statin therapy 02/24/2013  . Benign essential tremor 08/31/2011  . Hyperlipemia 08/31/2011  . Type 2 diabetes mellitus ([place]) 01/07/2010  . COLONIC POLYPS 01/01/2009  . DIVERTICULOSIS OF COLON 01/01/2009  . Former smoker 12/14/2008   Kerin Perna, PTA 06/01/20 1:47  PM  [place], [place], [address] Phone: [phone]   Fax:  [phone]  Name: JERMANIE MINSHALL MRN: [id number] Date of Birth: 05-12-1946

## 2020-06-01 NOTE — Patient Instructions (Signed)
Access Code: [email or web address]: [email or web address]: 07/13/2021Prepared by: [facility] [place]Exercises  Seated Upper Trapezius Stretch - 1 x daily - 7 x weekly - 3 reps - 1 sets - 30-60 sec hold  First Rib Mobilization with Strap - 1 x daily - 7 x weekly - 1 sets - 3 reps - 30 seconds hold  Sternocleidomastoid Stretch - 1 x daily - 7 x weekly - 1 sets - 3 reps - 30-60 hold  Supine Cervical Rotation PROM - 1 x daily - 7 x weekly - 1 sets - 5 reps - 10 sec hold  Standing Isometric Cervical Flexion with Manual Resistance - 1 x daily - 7 x weekly - 5 reps - 1 sets - 10 sec hold  Thoracic Extension Mobilization on Foam Roll - 1 x daily - 7 x weekly - 1 sets - 2 reps - 10-15 hold  Seated Thoracic Lumbar Extension with Pectoralis Stretch - 1 x daily - 7 x weekly - 1 sets - 2 reps - 5-10 hold  * and queen nods in supine (rotation with chin tucks) x 5 reps - supine UE diagonals with green band x 10 reps each.

## 2020-06-04 ENCOUNTER — Ambulatory Visit (INDEPENDENT_AMBULATORY_CARE_PROVIDER_SITE_OTHER): Payer: Medicare PPO | Admitting: Physical Therapy

## 2020-06-04 ENCOUNTER — Other Ambulatory Visit: Payer: Self-pay

## 2020-06-04 DIAGNOSIS — M542 Cervicalgia: Secondary | ICD-10-CM

## 2020-06-04 DIAGNOSIS — R293 Abnormal posture: Secondary | ICD-10-CM

## 2020-06-04 DIAGNOSIS — M5412 Radiculopathy, cervical region: Secondary | ICD-10-CM

## 2020-06-04 NOTE — Therapy (Addendum)
[place], [place], [address] Phone: [phone]   Fax:  [phone]  Physical Therapy Treatment  Patient Details  Name: Patrick Perkins MRN: [id number] Date of Birth: 1946/10/03 Referring Provider (PT): Emeterio Reeve DO   Encounter Date: 06/04/2020   PT End of Session - 06/04/20 0937    Visit Number 6    Number of Visits 12    Date for PT Re-Evaluation 06/24/20    Authorization Type [employer] MCR    Authorization Time Period 05/13/20-06/24/20    Authorization - Visit Number 6    Authorization - Number of Visits 12    Progress Note Due on Visit 10    PT Start Time 4627    PT Stop Time 1019    PT Time Calculation (min) 45 min    Activity Tolerance Patient tolerated treatment well;No increased pain    Behavior During Therapy WFL for tasks assessed/performed           Past Medical History:  Diagnosis Date   Diabetes mellitus    ED (erectile dysfunction)     Past Surgical History:  Procedure Laterality Date   HERNIA REPAIR  2005   double    There were no vitals filed for this visit.   Subjective Assessment - 06/04/20 0937    Subjective Pt reports he is "stiff as all get out".  He golfed Mon and Wed; reports increased stiffness the next 2 days. He stretched his neck, put heat, but no improvement.    Currently in Pain? No/denies    Pain Score 0-No pain   stiffness             [facility] PT Assessment - 06/04/20 0001      Assessment   Medical Diagnosis neck pain; cervical radiculopathy    Referring Provider (PT) Emeterio Reeve DO    Onset Date/Surgical Date 03/18/20    Hand Dominance Right    Prior Therapy yes - successful            [facility] Adult PT Treatment/Exercise - 06/04/20 0001      Neck Exercises: Machines for Strengthening   UBE (Upper Arm Bike) L3: 1.5 min each direction, standing      Neck Exercises: Seated   Cervical Rotation 5 reps;Right;Left   with head  nods     Lumbar Exercises: Quadruped   Single Arm Raise Right;Left;10 reps   head in neutral   Single Arm Raises Limitations Limited motion on LUE      Shoulder Exercises: Supine   Horizontal ABduction Both;10 reps    Theraband Level (Shoulder Horizontal ABduction) Level 3 (Green)    External Rotation Strengthening;Both;10 reps    Theraband Level (Shoulder External Rotation) Level 2 (Red)    Diagonals Right;Left;10 reps    Theraband Level (Shoulder Diagonals) Level 3 (Green)      Shoulder Exercises: Psychologist, sport and exercise 2 reps;20 seconds    Other Shoulder Stretches thoracic ext over back of chair with head supported x 10 sec x 3 reps;  Open book in Rt/Lt sidelying x 5 reps each side to tolerance    Other Shoulder Stretches prolonged stretch with arms abdct 90 deg (supine) x 45 sec.  low, mid, semi-high position doorway stretch x 15 sec x 2 reps each position.       Modalities   Modalities Electrical Stimulation;Moist Heat      Moist Heat Therapy   Number Minutes Moist Heat 10  Minutes    Moist Heat Location Cervical      Electrical Stimulation   Electrical Stimulation Location bilat upper trap and levator    Electrical Stimulation Action TENS    Electrical Stimulation Parameters intensity to tolerance    Electrical Stimulation Goals Pain;Tone      Manual Therapy   Manual therapy comments to decrease fascial restrictions and improve ROM    Soft tissue mobilization STM to bilat cervical paraspinals, upper trap, scalene, levator    Myofascial Release suboccipital release                   PT Education - 06/04/20 1023    Education Details TENS info    Person(s) Educated Patient    Methods Explanation;Handout    Comprehension Verbalized understanding               PT Long Term Goals - 06/01/20 0942      PT LONG TERM GOAL #1   Title I with advanced HEP    Time 6    Period Weeks    Status On-going      PT LONG TERM GOAL #2   Title report =/> 75%  improvements in tingling in neck and shoulders    Baseline 50% improvement - 06/01/20    Time 6    Period Weeks    Status On-going      PT LONG TERM GOAL #3   Title pt to report decrease in tingling with driving by 53% or more    Time 6    Period Weeks    Status Partially Met      PT LONG TERM GOAL #4   Title improve FOTO to </= 27% limitation    Time 6    Period Weeks    Status On-going      PT LONG TERM GOAL #5   Title improved cervical strength to 5/5    Time 6    Period Weeks    Status On-going                 Plan - 06/04/20 1013    Clinical Impression Statement Pt reporting increased tightness in bilat upper trap after golfing.  Pt tolerated exercises well, noting slight reduction in tightness in neck; further reduction with manual and MHP/estim to area.  Trial of TENS with encouragement of use at home after golf to decrease tightness. Progressing gradually towards goals.    Personal Factors and Comorbidities Comorbidity 3+    Comorbidities DM, OA, HTN, Essential tremor    Examination-Activity Limitations Other   driving   Stability/Clinical Decision Making Stable/Uncomplicated    Rehab Potential Good    PT Frequency 2x / week    PT Duration 6 weeks    PT Treatment/Interventions ADLs/Self Care Home Management;Moist Heat;Cryotherapy;Electrical Stimulation;Neuromuscular re-education;Therapeutic exercise;Therapeutic activities;Patient/family education;Manual techniques;Dry needling;Spinal Manipulations    PT Next Visit Plan cervical ROM and strength; cerv stab/postural strength    PT Home Exercise Plan [email or web address]    Consulted and Agree with Plan of Care Patient           Patient will benefit from skilled therapeutic intervention in order to improve the following deficits and impairments:  Decreased range of motion, Increased muscle spasms, Impaired flexibility, Hypomobility, Decreased strength  Visit Diagnosis: Cervicalgia  Abnormal posture  Radiculopathy,  cervical region     Problem List Patient Active Problem List   Diagnosis Date Noted   Neuroforaminal stenosis of cervical spine 04/26/2018  Degenerative disc disease, cervical 04/26/2018   Left hip pain 04/26/2018   Cervical radiculitis 07/09/2014   Ganglion cyst 03/23/2014   Encounter for monitoring statin therapy 02/24/2013   Benign essential tremor 08/31/2011   Hyperlipemia 08/31/2011   Type 2 diabetes mellitus ([place]) 01/07/2010   COLONIC POLYPS 01/01/2009   DIVERTICULOSIS OF COLON 01/01/2009   Former smoker 12/14/2008   Kerin Perna, PTA 06/04/20 10:24 AM  [place], [place], [address] Phone: [phone]   Fax:  [phone]  Name: TERMAINE ROUPP MRN: [id number] Date of Birth: Sep 26, 1946

## 2020-06-04 NOTE — Patient Instructions (Signed)

## 2020-06-08 ENCOUNTER — Encounter: Payer: Self-pay | Admitting: Rehabilitative and Restorative Service Providers"

## 2020-06-08 ENCOUNTER — Ambulatory Visit (INDEPENDENT_AMBULATORY_CARE_PROVIDER_SITE_OTHER): Payer: Medicare PPO | Admitting: Rehabilitative and Restorative Service Providers"

## 2020-06-08 ENCOUNTER — Other Ambulatory Visit: Payer: Self-pay

## 2020-06-08 DIAGNOSIS — R293 Abnormal posture: Secondary | ICD-10-CM

## 2020-06-08 DIAGNOSIS — M5412 Radiculopathy, cervical region: Secondary | ICD-10-CM | POA: Diagnosis not present

## 2020-06-08 DIAGNOSIS — M542 Cervicalgia: Secondary | ICD-10-CM | POA: Diagnosis not present

## 2020-06-08 NOTE — Therapy (Signed)
[place], [place], [address] Phone: [phone]   Fax:  [phone]  Physical Therapy Treatment  Patient Details  Name: Patrick Perkins MRN: [id number] Date of Birth: Apr 02, 1946 Referring Provider (PT): Emeterio Reeve DO   Encounter Date: 06/08/2020   PT End of Session - 06/08/20 1016    Visit Number 7    Number of Visits 12    Date for PT Re-Evaluation 06/24/20    Authorization Type [employer] MCR    Authorization Time Period 05/13/20-06/24/20    Authorization - Visit Number 7    Authorization - Number of Visits 12    Progress Note Due on Visit 10    PT Start Time 1016    PT Stop Time 1110    PT Time Calculation (min) 54 min    Activity Tolerance Patient tolerated treatment well;No increased pain           Past Medical History:  Diagnosis Date   Diabetes mellitus    ED (erectile dysfunction)     Past Surgical History:  Procedure Laterality Date   HERNIA REPAIR  2005   double    There were no vitals filed for this visit.   Subjective Assessment - 06/08/20 1017    Subjective Patient reports that his neck feels some better today. He didn't play golf yesterday. Neck is "just stiff".    Currently in Pain? No/denies    Pain Location Neck    Pain Orientation Left    Pain Descriptors / Indicators Tightness    Pain Type Chronic pain    Pain Frequency Intermittent    Aggravating Factors  driving; golfing                             [facility] Adult PT Treatment/Exercise - 06/08/20 0001      Neck Exercises: Machines for Strengthening   UBE (Upper Arm Bike) L3: 1.5 min each direction, standing      Neck Exercises: Standing   Neck Retraction 5 reps;10 secs    Wall Push Ups Limitations reverse wall push up 5 sec x 10     UE Flexion with Stabilization Limitations antirotation w/ blue TB stepping to side holding band 3 sec x 5 reps each side; pushing band forward 3 sec  hold x 5 reps each side     Other Standing Exercises wall elbow lean with thoracic opening R and L (rotation) x 10 reps each side    Other Standing Exercises W's x 5 sec hold, 5 reps       Neck Exercises: Seated   Cervical Rotation 5 reps;Right;Left   with head nods   Lateral Flexion Right;Left;5 reps      Shoulder Exercises: Stretch   Corner Stretch Limitations doorway stretch 3 positions 30 sec x 3 reps       Moist Heat Therapy   Number Minutes Moist Heat 14 Minutes    Moist Heat Location Cervical      Electrical Stimulation   Electrical Stimulation Location bilat cervical     Electrical Stimulation Action IFC    Electrical Stimulation Parameters to tolerance     Electrical Stimulation Goals Pain;Tone      Manual Therapy   Soft tissue mobilization ant/lateral/ posterior cervical musculature; pecs; upper trap; leveator - tighter Lt than Rt     Myofascial Release suboccipital release     Passive ROM cervical flexion; flexion with  slight rotation; lateral stretch for scaleni x 2 reps each position                   PT Education - 06/08/20 1042    Education Details HEP    Person(s) Educated Patient    Methods Explanation;Demonstration;Tactile cues;Verbal cues;Handout    Comprehension Verbalized understanding;Returned demonstration;Verbal cues required;Tactile cues required               PT Long Term Goals - 06/01/20 0942      PT LONG TERM GOAL #1   Title I with advanced HEP    Time 6    Period Weeks    Status On-going      PT LONG TERM GOAL #2   Title report =/> 75% improvements in tingling in neck and shoulders    Baseline 50% improvement - 06/01/20    Time 6    Period Weeks    Status On-going      PT LONG TERM GOAL #3   Title pt to report decrease in tingling with driving by 34% or more    Time 6    Period Weeks    Status Partially Met      PT LONG TERM GOAL #4   Title improve FOTO to </= 27% limitation    Time 6    Period Weeks    Status  On-going      PT LONG TERM GOAL #5   Title improved cervical strength to 5/5    Time 6    Period Weeks    Status On-going                 Plan - 06/08/20 1035    Clinical Impression Statement Some improvement in tightness today - no golfing yesterday. Working on exercises at home. Has his TENS unit but has not used it yet. Continued to work on upper core stabilization and strengthening. Good response to manual work and modalities with decreased tightness.    Rehab Potential Good    PT Frequency 2x / week    PT Duration 6 weeks    PT Treatment/Interventions ADLs/Self Care Home Management;Moist Heat;Cryotherapy;Electrical Stimulation;Neuromuscular re-education;Therapeutic exercise;Therapeutic activities;Patient/family education;Manual techniques;Dry needling;Spinal Manipulations    PT Next Visit Plan cervical ROM and strength; cerv stab/postural strength    PT Home Exercise Plan [email or web address]    Consulted and Agree with Plan of Care Patient           Patient will benefit from skilled therapeutic intervention in order to improve the following deficits and impairments:     Visit Diagnosis: Cervicalgia  Abnormal posture  Radiculopathy, cervical region     Problem List Patient Active Problem List   Diagnosis Date Noted   Neuroforaminal stenosis of cervical spine 04/26/2018   Degenerative disc disease, cervical 04/26/2018   Left hip pain 04/26/2018   Cervical radiculitis 07/09/2014   Ganglion cyst 03/23/2014   Encounter for monitoring statin therapy 02/24/2013   Benign essential tremor 08/31/2011   Hyperlipemia 08/31/2011   Type 2 diabetes mellitus ([place]) 01/07/2010   COLONIC POLYPS 01/01/2009   DIVERTICULOSIS OF COLON 01/01/2009   Former smoker 12/14/2008    Aleksandra Raben Nilda Simmer PT, MPH  06/08/2020, 11:00 AM  [facility] [place], [place], [address] Phone: [phone]   Fax:   [phone]  Name: Patrick Perkins MRN: [id number] Date of Birth: 1946-01-22

## 2020-06-08 NOTE — Patient Instructions (Signed)
Access Code: [email or web address]: [email or web address]: 07/20/2021Prepared by: Zemira Zehring HoltExercises  Seated Upper Trapezius Stretch - 1 x daily - 7 x weekly - 3 reps - 1 sets - 30-60 sec hold  First Rib Mobilization with Strap - 1 x daily - 7 x weekly - 1 sets - 3 reps - 30 seconds hold  Sternocleidomastoid Stretch - 1 x daily - 7 x weekly - 1 sets - 3 reps - 30-60 hold  Supine Cervical Rotation PROM - 1 x daily - 7 x weekly - 1 sets - 5 reps - 10 sec hold  Standing Isometric Cervical Flexion with Manual Resistance - 1 x daily - 7 x weekly - 5 reps - 1 sets - 10 sec hold  Thoracic Extension Mobilization on Foam Roll - 1 x daily - 7 x weekly - 1 sets - 2 reps - 10-15 hold  Seated Thoracic Lumbar Extension with Pectoralis Stretch - 1 x daily - 7 x weekly - 1 sets - 2 reps - 5-10 hold  Anti-Rotation Lateral Stepping with Press - 2 x daily - 7 x weekly - 1 sets - 10 reps - 3 sec hold

## 2020-06-11 ENCOUNTER — Ambulatory Visit (INDEPENDENT_AMBULATORY_CARE_PROVIDER_SITE_OTHER): Payer: Medicare PPO | Admitting: Physical Therapy

## 2020-06-11 ENCOUNTER — Other Ambulatory Visit: Payer: Self-pay

## 2020-06-11 DIAGNOSIS — M542 Cervicalgia: Secondary | ICD-10-CM | POA: Diagnosis not present

## 2020-06-11 DIAGNOSIS — M5412 Radiculopathy, cervical region: Secondary | ICD-10-CM | POA: Diagnosis not present

## 2020-06-11 DIAGNOSIS — R293 Abnormal posture: Secondary | ICD-10-CM | POA: Diagnosis not present

## 2020-06-11 NOTE — Therapy (Addendum)
[facility], [place], [address] Phone: [phone]   Fax:  [phone]  Physical Therapy Treatment  Patient Details  Name: Patrick Perkins MRN: [id number] Date of Birth: 1946-03-21 Referring Provider (PT): Emeterio Reeve DO   Encounter Date: 06/11/2020   PT End of Session - 06/11/20 1034    Visit Number 8    Number of Visits 12    Date for PT Re-Evaluation 06/24/20    Authorization Type [employer] MCR    Authorization Time Period 05/13/20-06/24/20    Authorization - Visit Number 8    Authorization - Number of Visits 12    Progress Note Due on Visit 10    PT Start Time 1019    PT Stop Time 1052    PT Time Calculation (min) 33 min    Activity Tolerance Patient tolerated treatment well;No increased pain    Behavior During Therapy WFL for tasks assessed/performed           Past Medical History:  Diagnosis Date  . Diabetes mellitus   . ED (erectile dysfunction)     Past Surgical History:  Procedure Laterality Date  . HERNIA REPAIR  2005   double    There were no vitals filed for this visit.   Subjective Assessment - 06/11/20 1059    Subjective "This is the best I have felt in a long time".  Pt reports he used TENS after golf and this seemed to help.    Patient Stated Goals eliminate the tingling    Currently in Pain? No/denies              [facility] PT Assessment - 06/11/20 0001      Assessment   Medical Diagnosis neck pain; cervical radiculopathy    Referring Provider (PT) Emeterio Reeve DO    Onset Date/Surgical Date 03/18/20    Hand Dominance Right    Prior Therapy yes - successful      Observation/Other Assessments   Focus on Therapeutic Outcomes (FOTO)  30 % limitation      Strength   Cervical Flexion 5/5    Cervical Extension 5/5            [facility] Adult PT Treatment/Exercise - 06/11/20 0001      Neck Exercises: Machines for Strengthening   UBE (Upper Arm Bike) L3:  1.5 min each direction, standing      Neck Exercises: Standing   Wall Push Ups Limitations reverse wall push up 5 sec x 10     UE Flexion with Stabilization Limitations anti-rotation side step with moving blue band in/out from core x 5 each side.     Other Standing Exercises wall elbow lean with thoracic opening R and L (rotation) x 10 reps each side    Other Standing Exercises bow and arrow with blue band x 10 each side.       Neck Exercises: Seated   Cervical Rotation 5 reps;Right;Left   with head nods   Lateral Flexion Right;Left;5 reps      Neck Exercises: Supine   Cervical Rotation Right;Left;5 reps   with head nods/chin tuck     Shoulder Exercises: Stretch   Star Gazer Stretch 4 reps;10 seconds   with elbow press   Other Shoulder Stretches 5 snow angels to tolerance     Other Shoulder Stretches 3 position doorway stretch x 2 reps, 15 sec each; overhead doorway stretch x 10 sec x 2; bilat bicep stretch x 15  sec x 3 rps      Modalities   Modalities --   held; will use at home if needed.      Manual Therapy   Soft tissue mobilization lateral/ posterior cervical paraspinals, levator, scalenes, upper trap.     Myofascial Release suboccipital release     Passive ROM cervical rotation and lateral flexion                        PT Long Term Goals - 06/11/20 1020      PT LONG TERM GOAL #1   Title I with advanced HEP    Time 6    Period Weeks    Status On-going      PT LONG TERM GOAL #2   Title report =/> 75% improvements in tingling in neck and shoulders    Baseline 50% improvement - 06/01/20    Time 6    Period Weeks    Status Achieved      PT LONG TERM GOAL #3   Title pt to report decrease in tingling with driving by 59% or more    Time 6    Period Weeks    Status Achieved      PT LONG TERM GOAL #4   Title improve FOTO to </= 27% limitation    Time 6    Period Weeks    Status Not Met      PT LONG TERM GOAL #5   Title improved cervical strength to  5/5    Time 6    Period Weeks    Status Achieved                 Plan - 06/11/20 1706    Clinical Impression Statement Pt tolerating exercises well, reporting reduction of stiffness in neck afterwards.  He has met most of his goals and is pleased with current level of function.  Pt requests to hold therapy while he continues with his HEP.    Rehab Potential Good    PT Frequency 2x / week    PT Duration 6 weeks    PT Treatment/Interventions ADLs/Self Care Home Management;Moist Heat;Cryotherapy;Electrical Stimulation;Neuromuscular re-education;Therapeutic exercise;Therapeutic activities;Patient/family education;Manual techniques;Dry needling;Spinal Manipulations    PT Next Visit Plan will hold therapy until 8/20 in case pt has flare up; if pt doesn't return will d/c.    PT Home Exercise Plan [id number]    Consulted and Agree with Plan of Care Patient           Patient will benefit from skilled therapeutic intervention in order to improve the following deficits and impairments:     Visit Diagnosis: Cervicalgia  Abnormal posture  Radiculopathy, cervical region     Problem List Patient Active Problem List   Diagnosis Date Noted  . Neuroforaminal stenosis of cervical spine 04/26/2018  . Degenerative disc disease, cervical 04/26/2018  . Left hip pain 04/26/2018  . Cervical radiculitis 07/09/2014  . Ganglion cyst 03/23/2014  . Encounter for monitoring statin therapy 02/24/2013  . Benign essential tremor 08/31/2011  . Hyperlipemia 08/31/2011  . Type 2 diabetes mellitus ([place]) 01/07/2010  . COLONIC POLYPS 01/01/2009  . DIVERTICULOSIS OF COLON 01/01/2009  . Former smoker 12/14/2008   Kerin Perna, PTA 06/11/20 5:10 PM  [facility] [place] [place], [place], [address] Phone: [phone]   Fax:  [phone]  Name: Patrick Perkins MRN: [id number] Date of Birth: [birth year]  PHYSICAL THERAPY  DISCHARGE  SUMMARY  Visits from Start of Care: 8  Current functional level related to goals / functional outcomes: See last progress note for discharge status    Remaining deficits: Continued tightness through the cervical spine    Education / Equipment: HEP Plan: Patient agrees to discharge.  Patient goals were partially met. Patient is being discharged due to being pleased with the current functional level.  ?????    Celyn P. Helene Kelp PT, MPH 07/14/20 9:31 AM

## 2020-06-17 ENCOUNTER — Other Ambulatory Visit: Payer: Self-pay | Admitting: Osteopathic Medicine

## 2020-07-22 DIAGNOSIS — Z1152 Encounter for screening for COVID-19: Secondary | ICD-10-CM | POA: Diagnosis not present

## 2020-07-22 DIAGNOSIS — H6693 Otitis media, unspecified, bilateral: Secondary | ICD-10-CM | POA: Diagnosis not present

## 2020-08-05 DIAGNOSIS — M542 Cervicalgia: Secondary | ICD-10-CM | POA: Diagnosis not present

## 2020-08-05 DIAGNOSIS — G250 Essential tremor: Secondary | ICD-10-CM | POA: Diagnosis not present

## 2020-08-05 DIAGNOSIS — M5412 Radiculopathy, cervical region: Secondary | ICD-10-CM | POA: Diagnosis not present

## 2020-08-10 DIAGNOSIS — H40013 Open angle with borderline findings, low risk, bilateral: Secondary | ICD-10-CM | POA: Diagnosis not present

## 2020-08-18 DIAGNOSIS — M25561 Pain in right knee: Secondary | ICD-10-CM | POA: Diagnosis not present

## 2020-08-24 ENCOUNTER — Ambulatory Visit: Payer: Medicare Other

## 2020-09-06 ENCOUNTER — Other Ambulatory Visit: Payer: Self-pay

## 2020-09-06 DIAGNOSIS — Z0000 Encounter for general adult medical examination without abnormal findings: Secondary | ICD-10-CM

## 2020-09-06 DIAGNOSIS — E785 Hyperlipidemia, unspecified: Secondary | ICD-10-CM

## 2020-09-06 DIAGNOSIS — E119 Type 2 diabetes mellitus without complications: Secondary | ICD-10-CM

## 2020-09-06 DIAGNOSIS — I10 Essential (primary) hypertension: Secondary | ICD-10-CM

## 2020-09-07 ENCOUNTER — Encounter: Payer: Self-pay | Admitting: Osteopathic Medicine

## 2020-09-07 ENCOUNTER — Ambulatory Visit (INDEPENDENT_AMBULATORY_CARE_PROVIDER_SITE_OTHER): Payer: Medicare PPO | Admitting: Osteopathic Medicine

## 2020-09-07 VITALS — BP 164/78 | HR 61 | Temp 97.8°F | Wt 185.1 lb

## 2020-09-07 DIAGNOSIS — Z0000 Encounter for general adult medical examination without abnormal findings: Secondary | ICD-10-CM

## 2020-09-07 DIAGNOSIS — Z87891 Personal history of nicotine dependence: Secondary | ICD-10-CM

## 2020-09-07 DIAGNOSIS — E119 Type 2 diabetes mellitus without complications: Secondary | ICD-10-CM | POA: Diagnosis not present

## 2020-09-07 DIAGNOSIS — I10 Essential (primary) hypertension: Secondary | ICD-10-CM | POA: Diagnosis not present

## 2020-09-07 DIAGNOSIS — Z23 Encounter for immunization: Secondary | ICD-10-CM | POA: Diagnosis not present

## 2020-09-07 DIAGNOSIS — E785 Hyperlipidemia, unspecified: Secondary | ICD-10-CM | POA: Diagnosis not present

## 2020-09-07 MED ORDER — METFORMIN HCL ER 500 MG PO TB24: 500.0000 mg | ORAL_TABLET | Freq: Every day | ORAL | 3 refills | Status: DC

## 2020-09-07 MED ORDER — ENALAPRIL MALEATE 10 MG PO TABS
10.0000 mg | ORAL_TABLET | Freq: Every day | ORAL | 3 refills | Status: DC
Start: 2020-09-07 — End: 2021-09-09

## 2020-09-07 MED ORDER — LOVASTATIN 40 MG PO TABS
20.0000 mg | ORAL_TABLET | Freq: Every day | ORAL | 3 refills | Status: DC
Start: 2020-09-07 — End: 2021-10-18

## 2020-09-07 NOTE — Patient Instructions (Addendum)
Plan: Labs today Due for lung cancer screening w/ CT scan  Eye exam recommended annually  Otherwise, you're up to date on everything!

## 2020-09-07 NOTE — Progress Notes (Signed)
HPI: Patrick Perkins is a 74 y.o. male  who presents to [facility] today, 09/07/20,  for Medicare Annual Wellness Exam  Patient presents for annual physical/Medicare wellness exam. No  complaints today.  BP Readings from Last 3 Encounters:  09/07/20 (!) 164/78  05/22/20 (!) 163/82  05/18/20 124/77  Home BP 120-130 / 60-70    Past medical, surgical, social and family history reviewed:  Patient Active Problem List   Diagnosis Date Noted  . Neuroforaminal stenosis of cervical spine 04/26/2018  . Degenerative disc disease, cervical 04/26/2018  . Left hip pain 04/26/2018  . Cervical radiculitis 07/09/2014  . Ganglion cyst 03/23/2014  . Encounter for monitoring statin therapy 02/24/2013  . Benign essential tremor 08/31/2011  . Hyperlipemia 08/31/2011  . Type 2 diabetes mellitus ([place]) 01/07/2010  . COLONIC POLYPS 01/01/2009  . DIVERTICULOSIS OF COLON 01/01/2009  . Former smoker 12/14/2008    Past Surgical History:  Procedure Laterality Date  . HERNIA REPAIR  2005   double    Social History   Socioeconomic History  . Marital status: Married    Spouse name: Peter Congo  . Number of children: 1  . Years of education: college  . Highest education level: 12th grade  Occupational History  . 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 Smoking status: Former Smoker    Quit date: 09/30/2009    Years since quitting: 10.9  . Smokeless tobacco: Never Used  Vaping Use  . Vaping Use: Never used  Substance and Sexual Activity  . Alcohol use: Yes    Alcohol/week: 1.0 standard drink    Types: 1 Cans of beer per week    Comment: occasionally  . Drug use: Never  . Sexual activity: Not Currently  Other Topics Concern  . Not on file  Social History Narrative   Plays golf twice a week   Social Determinants of Health   Financial Resource Strain:   . Difficulty of Paying Living Expenses:  Not on file  Food Insecurity:   . Worried About Charity fundraiser in the Last Year: Not on file  . Ran Out of Food in the Last Year: Not on file  Transportation Needs:   . Lack of Transportation (Medical): Not on file  . Lack of Transportation (Non-Medical): Not on file  Physical Activity:   . Days of Exercise per Week: Not on file  . Minutes of Exercise per Session: Not on file  Stress:   . Feeling of Stress : Not on file  Social Connections:   . Frequency of Communication with Friends and Family: Not on file  . Frequency of Social Gatherings with Friends and Family: Not on file  . Attends Religious Services: Not on file  . Active Member of Clubs or Organizations: Not on file  . Attends Archivist Meetings: Not on file  . Marital Status: Not on file  Intimate Partner Violence:   . Fear of Current or Ex-Partner: Not on file  . Emotionally Abused: Not on file  . Physically Abused: Not on file  . Sexually Abused: Not on file    Family History  Problem Relation Age of Onset  . Cancer Father 73       colon     Current medication list and allergy/intolerance information reviewed:    Outpatient Encounter Medications as of 09/07/2020  Medication Sig  . AMBULATORY NON  FORMULARY MEDICATION One Touch Verio glucose monitor test strips and lancets.  Check blood sugar daily. Dx type 2 diabetes. E11.9  . aspirin 81 MG tablet Take 81 mg by mouth daily.    . enalapril (VASOTEC) 10 MG tablet Take 1 tablet (10 mg total) by mouth daily.  Marland Kitchen glucose blood (ACCU-CHEK AVIVA) test strip Test blood sugar twice daily. Dx: E11.9  . lovastatin (MEVACOR) 40 MG tablet Take 0.5 tablets (20 mg total) by mouth at bedtime.  . Multiple Vitamins-Minerals (MULTIVITAMIN PO) Take by mouth.  . primidone (MYSOLINE) 250 MG tablet Take 250 mg by mouth at bedtime.  . primidone (MYSOLINE) 50 MG tablet Take by mouth at bedtime.  . [DISCONTINUED] enalapril (VASOTEC) 10 MG tablet Take 1 tablet by mouth once  daily  . [DISCONTINUED] lovastatin (MEVACOR) 40 MG tablet Take 0.5 tablets (20 mg total) by mouth at bedtime.  . metFORMIN (GLUCOPHAGE XR) 500 MG 24 hr tablet Take 1 tablet (500 mg total) by mouth daily with breakfast.  . [DISCONTINUED] amoxicillin-clavulanate (AUGMENTIN) 875-125 MG tablet Take one tab PO Q8hr (Patient not taking: Reported on 09/07/2020)  . [DISCONTINUED] azelastine (ASTELIN) 0.1 % nasal spray Place 2 sprays into both nostrils 2 (two) times daily. Use in each nostril as directed (Patient not taking: Reported on 09/07/2020)  . [DISCONTINUED] metFORMIN (GLUCOPHAGE XR) 500 MG 24 hr tablet Take 1 tablet (500 mg total) by mouth daily with breakfast.   No facility-administered encounter medications on file as of 09/07/2020.    No Known Allergies     Review of Systems: Review of Systems - patient has no concerns    Medicare Wellness Questionnaire  Are there smokers in your home (other than you)? no  Depression Screen (Note: if answer to either of the following is "Yes", a more complete depression screening is indicated)   Q1: Over the past two weeks, have you felt down, depressed or hopeless? no  Q2: Over the past two weeks, have you felt little interest or pleasure in doing things? no  Have you lost interest or pleasure in daily life? no  Do you often feel hopeless? no  Do you cry easily over simple problems? no  Activities of Daily Living In your present state of health, do you have any difficulty performing the following activities?:  Driving? no Managing money?  no Feeding yourself? no Getting from bed to chair? no Climbing a flight of stairs? no Preparing food and eating?: no Bathing or showering? no Getting dressed: no Getting to the toilet? no Using the toilet: no Moving around from place to place: no In the past year have you fallen or had a near fall?: no  Hearing Difficulties:  Do you often ask people to speak up or repeat themselves? yes Do you  experience ringing or noises in your ears? no  Do you have difficulty understanding soft or whispered voices? yes  Memory Difficulties:  Do you feel that you have a problem with memory? no  Do you often misplace items? no  Do you feel safe at home?  yes  Sexual Health:   Are you sexually active?  Yes  Do you have more than one partner?  No  Advanced Directives:   Advanced directives discussed: has an advanced directive - a copy HAS NOT been provided.  Additional information provided: no  Risk Factors  Current exercise habits: golf, yard work, walking 3x/week  Dietary issues discussed:no concerns   Cardiac risk factors: Diabetes Mellitus, hypertension   Exam:  BP (!) 164/78 (BP Location: Left Arm, Patient Position: Sitting, Cuff Size: Normal)   Pulse 61   Temp 97.8 F (36.6 C) (Oral)   Wt 185 lb 1.3 oz (84 kg)   BMI 28.14 kg/m  Vision by Snellen chart: right eye:see nurse notes, left eye:see nurse notes  Constitutional: VS see above. General Appearance: alert, well-developed, well-nourished, NAD  Ears, Nose, Mouth, Throat: MMM  Neck: No masses, trachea midline.   Respiratory: Normal respiratory effort. no wheeze, no rhonchi, no rales  Cardiovascular:No lower extremity edema.   Musculoskeletal: Gait normal. No clubbing/cyanosis of digits.   Neurological: Normal balance/coordination. No tremor. Recalls 3 objects and able to read face of watch with correct time.   Skin: warm, dry, intact. No rash/ulcer.   Psychiatric: Normal judgment/insight. Normal mood and affect. Oriented x3.     ASSESSMENT/PLAN:   Encounter for Medicare annual wellness exam  Medicare annual wellness visit, subsequent  Need for influenza vaccination - Plan: Flu Vaccine QUAD High Dose(Fluad)  Essential hypertension  Type 2 diabetes mellitus without complication, without long-term current use of insulin ([place])  Hyperlipidemia, unspecified hyperlipidemia type  Former smoker - Plan: CT  CHEST LUNG CA SCREEN LOW DOSE W/O CM  Annual physical exam      General Preventive Care  Most recent routine screening labs: ordered .   Blood pressure goal 130/80 or less.   Tobacco: don't!   Alcohol: responsible moderation is ok for most adults - if you have concerns about your alcohol intake, please talk to me!   Exercise: as tolerated to reduce risk of cardiovascular disease and diabetes. Strength training will also prevent osteoporosis.   Mental health: if need for mental health care (medicines, counseling, other), or concerns about moods, please let me know!   Sexual / Reproductive health: if need for STD testing, or if concerns with libido/pain problems, please let me know!  Advanced Directive: Living Will and/or Healthcare Power of Attorney recommended for all adults, regardless of age or health.  Vaccines  Flu vaccine: every fall.   Shingles vaccine: done  Pneumonia vaccines: done  Tetanus booster: every 10 years - due 2029  COVID vaccine: THANKS for getting your vaccine! :) Immunization History  Administered Date(s) Administered  . Fluad Quad(high Dose 65+) 09/05/2019, 09/07/2020  . H1N1 12/14/2008  . Influenza Split 09/04/2011, 09/03/2012  . Influenza Whole 09/06/2009, 09/20/2010, 08/20/2013  . Influenza, High Dose Seasonal PF 09/18/2017  . Influenza,inj,Quad PF,6+ Mos 09/10/2018  . Influenza-Unspecified 08/20/2014, 07/22/2015, 09/03/2016  . PFIZER SARS-COV-2 Vaccination 03/02/2020, 03/22/2020  . Pneumococcal Conjugate-13 04/23/2015  . Pneumococcal Polysaccharide-23 09/20/2010, 04/03/2017  . Td 12/14/2008  . Tdap 10/29/2018  . Zoster 02/29/2012  . Zoster Recombinat (Shingrix) 04/07/2017, 06/21/2017   Cancer screenings   Colon cancer screening: for everyone age 49-75.   Prostate cancer screening: PSA blood test age 29-71  Lung cancer screening: annual screening due  Infection screenings  . HIV: recommended screening at least once age  29-65 . Gonorrhea/Chlamydia: screening as needed . Hepatitis C: recommended once for everyone age 37-75 . TB: certain at-risk populations, or depending on work requirements and/or travel history Other . Bone Density Test: consider age 26 in men  . Abdominal Aortic Aneurysm: done 2010        During the course of the visit the patient was educated and counseled about appropriate screening and preventive services as noted above.   Patient Instructions (the written plan) was given to the patient.  Medicare Attestation I have personally reviewed:  The patient's medical and social history Their use of alcohol, tobacco or illicit drugs Their current medications and supplements The patient's functional ability including ADLs,fall risks, home safety risks, cognitive, and hearing and visual impairment Diet and physical activities Evidence for depression or mood disorders  The patient's weight, height, BMI, and visual acuity have been recorded in the chart.  I have made referrals, counseling, and provided education to the patient based on review of the above and I have provided the patient with a written personalized care plan for preventive services.     Emeterio Reeve, DO   09/07/20   Visit summary with medication list and pertinent instructions was printed for patient to review. All questions at time of visit were answered - patient instructed to contact office with any additional concerns. ER/RTC precautions were reviewed with the patient. Follow-up plan: Return in about 6 months (around 03/08/2021) for IN-OFFICE VISIT MONITOR A1C/SUGARS AND BLOOD PRESSURE, OTHERWISE SEE ME SOONER IF NEEDED .

## 2020-09-08 LAB — COMPLETE METABOLIC PANEL WITH GFR
AG Ratio: 1.9 (calc) (ref 1.0–2.5)
ALT: 27 U/L (ref 9–46)
AST: 17 U/L (ref 10–35)
Albumin: 4.4 g/dL (ref 3.6–5.1)
Alkaline phosphatase (APISO): 89 U/L (ref 35–144)
BUN: 12 mg/dL (ref 7–25)
CO2: 30 mmol/L (ref 20–32)
Calcium: 9.3 mg/dL (ref 8.6–10.3)
Chloride: 103 mmol/L (ref 98–110)
Creat: 0.92 mg/dL (ref 0.70–1.18)
GFR, Est African American: 95 mL/min/{1.73_m2} (ref >=60)
GFR, Est Non African American: 82 mL/min/{1.73_m2} (ref >=60)
Globulin: 2.3 g/dL (calc) (ref 1.9–3.7)
Glucose, Bld: 142 mg/dL — ABNORMAL HIGH (ref 65–99)
Potassium: 5.4 mmol/L — ABNORMAL HIGH (ref 3.5–5.3)
Sodium: 141 mmol/L (ref 135–146)
Total Bilirubin: 0.5 mg/dL (ref 0.2–1.2)
Total Protein: 6.7 g/dL (ref 6.1–8.1)

## 2020-09-08 LAB — LIPID PANEL
Cholesterol: 173 mg/dL (ref <200)
HDL: 63 mg/dL (ref >=40)
LDL Cholesterol (Calc): 86 mg/dL (calc)
Non-HDL Cholesterol (Calc): 110 mg/dL (calc) (ref <130)
Total CHOL/HDL Ratio: 2.7 (calc) (ref <5.0)
Triglycerides: 138 mg/dL (ref <150)

## 2020-09-08 LAB — HEMOGLOBIN A1C
Hgb A1c MFr Bld: 6.2 % of total Hgb — ABNORMAL HIGH (ref <5.7)
Mean Plasma Glucose: 131 (calc)
eAG (mmol/L): 7.3 (calc)

## 2020-09-08 LAB — TSH: TSH: 1.82 mIU/L (ref 0.40–4.50)

## 2020-09-10 ENCOUNTER — Ambulatory Visit: Payer: Medicare PPO | Admitting: Osteopathic Medicine

## 2020-09-14 ENCOUNTER — Other Ambulatory Visit: Payer: Self-pay

## 2020-09-14 ENCOUNTER — Ambulatory Visit (INDEPENDENT_AMBULATORY_CARE_PROVIDER_SITE_OTHER): Payer: Medicare PPO

## 2020-09-14 DIAGNOSIS — Z87891 Personal history of nicotine dependence: Secondary | ICD-10-CM

## 2020-09-14 DIAGNOSIS — Z122 Encounter for screening for malignant neoplasm of respiratory organs: Secondary | ICD-10-CM | POA: Diagnosis not present

## 2020-10-06 DIAGNOSIS — H40053 Ocular hypertension, bilateral: Secondary | ICD-10-CM | POA: Diagnosis not present

## 2020-10-06 DIAGNOSIS — H52203 Unspecified astigmatism, bilateral: Secondary | ICD-10-CM | POA: Diagnosis not present

## 2020-10-06 DIAGNOSIS — E113293 Type 2 diabetes mellitus with mild nonproliferative diabetic retinopathy without macular edema, bilateral: Secondary | ICD-10-CM | POA: Diagnosis not present

## 2020-10-06 DIAGNOSIS — H02831 Dermatochalasis of right upper eyelid: Secondary | ICD-10-CM | POA: Diagnosis not present

## 2020-10-06 DIAGNOSIS — D3132 Benign neoplasm of left choroid: Secondary | ICD-10-CM | POA: Diagnosis not present

## 2020-10-06 DIAGNOSIS — H11003 Unspecified pterygium of eye, bilateral: Secondary | ICD-10-CM | POA: Diagnosis not present

## 2020-10-06 DIAGNOSIS — H527 Unspecified disorder of refraction: Secondary | ICD-10-CM | POA: Diagnosis not present

## 2020-10-06 DIAGNOSIS — H25813 Combined forms of age-related cataract, bilateral: Secondary | ICD-10-CM | POA: Diagnosis not present

## 2020-10-06 DIAGNOSIS — H02834 Dermatochalasis of left upper eyelid: Secondary | ICD-10-CM | POA: Diagnosis not present

## 2020-10-06 LAB — HM DIABETES EYE EXAM

## 2020-10-16 ENCOUNTER — Other Ambulatory Visit: Payer: Self-pay | Admitting: Osteopathic Medicine

## 2020-10-28 DIAGNOSIS — G250 Essential tremor: Secondary | ICD-10-CM | POA: Diagnosis not present

## 2020-10-28 DIAGNOSIS — M542 Cervicalgia: Secondary | ICD-10-CM | POA: Diagnosis not present

## 2020-10-28 DIAGNOSIS — M5412 Radiculopathy, cervical region: Secondary | ICD-10-CM | POA: Diagnosis not present

## 2020-12-01 DIAGNOSIS — Z20822 Contact with and (suspected) exposure to covid-19: Secondary | ICD-10-CM | POA: Diagnosis not present

## 2020-12-01 DIAGNOSIS — R52 Pain, unspecified: Secondary | ICD-10-CM | POA: Diagnosis not present

## 2020-12-07 DIAGNOSIS — H40053 Ocular hypertension, bilateral: Secondary | ICD-10-CM | POA: Diagnosis not present

## 2020-12-07 DIAGNOSIS — H25811 Combined forms of age-related cataract, right eye: Secondary | ICD-10-CM | POA: Insufficient documentation

## 2020-12-07 DIAGNOSIS — H25813 Combined forms of age-related cataract, bilateral: Secondary | ICD-10-CM | POA: Diagnosis not present

## 2020-12-07 DIAGNOSIS — E1136 Type 2 diabetes mellitus with diabetic cataract: Secondary | ICD-10-CM | POA: Diagnosis not present

## 2020-12-07 DIAGNOSIS — H52221 Regular astigmatism, right eye: Secondary | ICD-10-CM | POA: Insufficient documentation

## 2020-12-07 DIAGNOSIS — H524 Presbyopia: Secondary | ICD-10-CM | POA: Insufficient documentation

## 2020-12-07 DIAGNOSIS — H02834 Dermatochalasis of left upper eyelid: Secondary | ICD-10-CM | POA: Diagnosis not present

## 2020-12-07 DIAGNOSIS — H52203 Unspecified astigmatism, bilateral: Secondary | ICD-10-CM | POA: Diagnosis not present

## 2020-12-07 DIAGNOSIS — H527 Unspecified disorder of refraction: Secondary | ICD-10-CM | POA: Diagnosis not present

## 2020-12-07 DIAGNOSIS — H11003 Unspecified pterygium of eye, bilateral: Secondary | ICD-10-CM | POA: Diagnosis not present

## 2020-12-07 DIAGNOSIS — I10 Essential (primary) hypertension: Secondary | ICD-10-CM | POA: Diagnosis not present

## 2020-12-07 DIAGNOSIS — H02831 Dermatochalasis of right upper eyelid: Secondary | ICD-10-CM | POA: Diagnosis not present

## 2020-12-15 ENCOUNTER — Encounter: Payer: Self-pay | Admitting: Physician Assistant

## 2020-12-15 ENCOUNTER — Ambulatory Visit (INDEPENDENT_AMBULATORY_CARE_PROVIDER_SITE_OTHER): Payer: Medicare PPO | Admitting: Physician Assistant

## 2020-12-15 ENCOUNTER — Other Ambulatory Visit: Payer: Self-pay

## 2020-12-15 VITALS — BP 140/60 | HR 66 | Ht 68.0 in | Wt 187.0 lb

## 2020-12-15 DIAGNOSIS — R1031 Right lower quadrant pain: Secondary | ICD-10-CM | POA: Diagnosis not present

## 2020-12-15 DIAGNOSIS — K4091 Unilateral inguinal hernia, without obstruction or gangrene, recurrent: Secondary | ICD-10-CM

## 2020-12-15 NOTE — Patient Instructions (Signed)
Inguinal Hernia, Adult An inguinal hernia is when fat or your intestines push through a weak spot in a muscle where your leg meets your lower belly (groin). This causes a bulge. This kind of hernia could also be:  In your scrotum, if you are male.  In folds of skin around your vagina, if you are male. There are three types of inguinal hernias:  Hernias that can be pushed back into the belly (are reducible). This type rarely causes pain.  Hernias that cannot be pushed back into the belly (are incarcerated).  Hernias that cannot be pushed back into the belly and lose their blood supply (are strangulated). This type needs emergency surgery. What are the causes? This condition is caused by having a weak spot in the muscles or tissues in your groin. This develops over time. The hernia may poke through the weak spot when you strain your lower belly muscles all of a sudden, such as when you:  Lift a heavy object.  Strain to poop (have a bowel movement). Trouble pooping (constipation) can lead to straining.  Cough. What increases the risk? This condition is more likely to develop in:  Males.  Pregnant females.  People who: ? Are overweight. ? Work in jobs that require long periods of standing or heavy lifting. ? Have had an inguinal hernia before. ? Smoke or have lung disease. These factors can lead to long-term (chronic) coughing. What are the signs or symptoms? Symptoms may depend on the size of the hernia. Often, a small hernia has no symptoms. Symptoms of a larger hernia may include:  A bulge in the groin area. This is easier to see when standing. You might not be able to see it when you are lying down.  Pain or burning in the groin. This may get worse when you lift, strain, or cough.  A dull ache or a feeling of pressure in the groin.  An abnormal bulge in the scrotum, in males. Symptoms of a strangulated inguinal hernia may include:  A bulge in your groin that is very  painful and tender to the touch.  A bulge that turns red or purple.  Fever, feeling like you may vomit (nausea), and vomiting.  Not being able to poop or to pass gas. How is this treated? Treatment depends on the size of your hernia and whether you have symptoms. If you do not have symptoms, your doctor may have you watch your hernia carefully and have you come in for follow-up visits. If your hernia is large or if you have symptoms, you may need surgery to repair the hernia. Follow these instructions at home: Lifestyle  Avoid lifting heavy objects.  Avoid standing for long amounts of time.  Do not smoke or use any products that contain nicotine or tobacco. If you need help quitting, ask your doctor.  Stay at a healthy weight. Prevent trouble pooping You may need to take these actions to prevent or treat trouble pooping:  Drink enough fluid to keep your pee (urine) pale yellow.  Take over-the-counter or prescription medicines.  Eat foods that are high in fiber. These include beans, whole grains, and fresh fruits and vegetables.  Limit foods that are high in fat and sugar. These include fried or sweet foods. General instructions  You may try to push your hernia back in place by very gently pressing on it when you are lying down. Do not try to push the bulge back in if it will not go in   easily.  Watch your hernia for any changes in shape, size, or color. Tell your doctor if you see any changes.  Take over-the-counter and prescription medicines only as told by your doctor.  Keep all follow-up visits. Contact a doctor if:  You have a fever or chills.  You have new symptoms.  Your symptoms get worse. Get help right away if:  You have pain in your groin that gets worse all of a sudden.  You have a bulge in your groin that: ? Gets bigger all of a sudden, and it does not get smaller after that. ? Turns red or purple. ? Is painful when you touch it.  You are a male, and  you have: ? Sudden pain in your scrotum. ? A sudden change in the size of your scrotum.  You cannot push the hernia back in place by very gently pressing on it when you are lying down.  You feel like you may vomit, and that feeling does not go away.  You keep vomiting.  You have a fast heartbeat.  You cannot poop or pass gas. These symptoms may be an emergency. Get help right away. Call your local emergency services (911 in the [place]  Do not wait to see if the symptoms will go away.  Do not drive yourself to the hospital. Summary  An inguinal hernia is when fat or your intestines push through a weak spot in a muscle where your leg meets your lower belly (groin). This causes a bulge.  If you do not have symptoms, you may not need treatment. If you have symptoms or a large hernia, you may need surgery.  Avoid lifting heavy objects. Also, avoid standing for long amounts of time.  Do not try to push the bulge back in if it will not go in easily. This information is not intended to replace advice given to you by your health care provider. Make sure you discuss any questions you have with your health care provider. Document Revised: 07/06/2020 Document Reviewed: 07/06/2020 Elsevier Patient Education  2021 [employer]  

## 2020-12-15 NOTE — Progress Notes (Signed)
   Subjective:    Patient ID: Patrick Perkins, male    DOB: 1946/05/06, 75 y.o.   MRN: [id number]  HPI  Pt is a 75 yo male with hx of recurrent right inguinal hernia with the last repair in 2013 and new right groin pain for 2 weeks.   He admits to lifting about 50lbs when he felt the pain. Now pain and pressure is worse with coughing, lifting, pushing and pulling. He has not noticed a bulge like he has the the past. Not tried anything to make better except refrained from lifting. Denies any bowel changes, fever, nausea, vomiting.  .. Active Ambulatory Problems    Diagnosis Date Noted  . COLONIC POLYPS 01/01/2009  . Type 2 diabetes mellitus ([place]) 01/07/2010  . Former smoker 12/14/2008  . DIVERTICULOSIS OF COLON 01/01/2009  . Benign essential tremor 08/31/2011  . Hyperlipemia 08/31/2011  . Encounter for monitoring statin therapy 02/24/2013  . Ganglion cyst 03/23/2014  . Cervical radiculitis 07/09/2014  . Neuroforaminal stenosis of cervical spine 04/26/2018  . Degenerative disc disease, cervical 04/26/2018  . Left hip pain 04/26/2018  . Recurrent right inguinal hernia 12/16/2020   Resolved Ambulatory Problems    Diagnosis Date Noted  . No Resolved Ambulatory Problems   Past Medical History:  Diagnosis Date  . Diabetes mellitus   . ED (erectile dysfunction)       Review of Systems See HPI.     Objective:   Physical Exam Vitals reviewed.  Constitutional:      Appearance: He is well-developed.  Abdominal:     General: Bowel sounds are normal.     Palpations: Abdomen is soft.     Hernia: A hernia is present. Hernia is present in the ventral area and right inguinal area.  Neurological:     General: No focal deficit present.     Mental Status: He is alert.  Psychiatric:        Mood and Affect: Mood normal.           Assessment & Plan:  Marland KitchenMarland KitchenAdael was seen today for abdominal pain.  Diagnoses and all orders for this visit:  Right groin pain  Recurrent right  inguinal hernia  Right lower quadrant abdominal pain -     CT Abdomen Pelvis W Contrast   I felt like their was very minimal bulge in the right inguinal area. Due to his history and recent lifting it is likely. Will get CT abdomen and pelvis for confirmation. If CT positive will send to general surgery.

## 2020-12-16 ENCOUNTER — Telehealth: Payer: Self-pay | Admitting: Physician Assistant

## 2020-12-16 ENCOUNTER — Telehealth: Payer: Self-pay | Admitting: *Deleted

## 2020-12-16 DIAGNOSIS — Z79899 Other long term (current) drug therapy: Secondary | ICD-10-CM

## 2020-12-16 DIAGNOSIS — K4091 Unilateral inguinal hernia, without obstruction or gangrene, recurrent: Secondary | ICD-10-CM | POA: Insufficient documentation

## 2020-12-16 NOTE — Telephone Encounter (Signed)
Patrick Perkins, will you call patient. I ordered a CT scan first to confirm hernia since I did not overwhelming palpate a bulge I wanted to confirm before I wasted his time with gen surgery referral.

## 2020-12-16 NOTE — Telephone Encounter (Signed)
Pt is going to need lab work done before he can be scheduled for his CT with contrast.

## 2020-12-16 NOTE — Telephone Encounter (Signed)
Patient made aware and is okay with this plan.

## 2020-12-17 DIAGNOSIS — Z79899 Other long term (current) drug therapy: Secondary | ICD-10-CM | POA: Diagnosis not present

## 2020-12-17 NOTE — Telephone Encounter (Signed)
Pt notified of lab work ordered

## 2020-12-17 NOTE — Telephone Encounter (Signed)
Labs ordered.

## 2020-12-18 LAB — COMPLETE METABOLIC PANEL WITH GFR
AG Ratio: 1.6 (calc) (ref 1.0–2.5)
ALT: 32 U/L (ref 9–46)
AST: 23 U/L (ref 10–35)
Albumin: 4.7 g/dL (ref 3.6–5.1)
Alkaline phosphatase (APISO): 90 U/L (ref 35–144)
BUN: 14 mg/dL (ref 7–25)
CO2: 30 mmol/L (ref 20–32)
Calcium: 9.8 mg/dL (ref 8.6–10.3)
Chloride: 101 mmol/L (ref 98–110)
Creat: 0.92 mg/dL (ref 0.70–1.18)
GFR, Est African American: 95 mL/min/{1.73_m2} (ref >=60)
GFR, Est Non African American: 82 mL/min/{1.73_m2} (ref >=60)
Globulin: 3 g/dL (calc) (ref 1.9–3.7)
Glucose, Bld: 122 mg/dL — ABNORMAL HIGH (ref 65–99)
Potassium: 5 mmol/L (ref 3.5–5.3)
Sodium: 142 mmol/L (ref 135–146)
Total Bilirubin: 0.4 mg/dL (ref 0.2–1.2)
Total Protein: 7.7 g/dL (ref 6.1–8.1)

## 2020-12-20 NOTE — Telephone Encounter (Signed)
Ok for CT scan.

## 2020-12-27 ENCOUNTER — Other Ambulatory Visit: Payer: Self-pay

## 2020-12-27 ENCOUNTER — Ambulatory Visit (INDEPENDENT_AMBULATORY_CARE_PROVIDER_SITE_OTHER): Payer: Medicare PPO

## 2020-12-27 DIAGNOSIS — K5730 Diverticulosis of large intestine without perforation or abscess without bleeding: Secondary | ICD-10-CM | POA: Diagnosis not present

## 2020-12-27 DIAGNOSIS — R1031 Right lower quadrant pain: Secondary | ICD-10-CM | POA: Diagnosis not present

## 2020-12-27 MED ORDER — IOHEXOL 300 MG/ML  SOLN
100.0000 mL | Freq: Once | INTRAMUSCULAR | Status: AC | PRN
  Administered 2020-12-27: 100 mL via INTRAVENOUS

## 2020-12-28 DIAGNOSIS — H02834 Dermatochalasis of left upper eyelid: Secondary | ICD-10-CM | POA: Diagnosis not present

## 2020-12-28 DIAGNOSIS — Z961 Presence of intraocular lens: Secondary | ICD-10-CM | POA: Diagnosis not present

## 2020-12-28 DIAGNOSIS — Z87891 Personal history of nicotine dependence: Secondary | ICD-10-CM | POA: Diagnosis not present

## 2020-12-28 DIAGNOSIS — H52202 Unspecified astigmatism, left eye: Secondary | ICD-10-CM | POA: Diagnosis not present

## 2020-12-28 DIAGNOSIS — E1136 Type 2 diabetes mellitus with diabetic cataract: Secondary | ICD-10-CM | POA: Diagnosis not present

## 2020-12-28 DIAGNOSIS — I10 Essential (primary) hypertension: Secondary | ICD-10-CM | POA: Diagnosis not present

## 2020-12-28 DIAGNOSIS — H25812 Combined forms of age-related cataract, left eye: Secondary | ICD-10-CM | POA: Diagnosis not present

## 2020-12-28 DIAGNOSIS — D3132 Benign neoplasm of left choroid: Secondary | ICD-10-CM | POA: Diagnosis not present

## 2020-12-28 DIAGNOSIS — H40053 Ocular hypertension, bilateral: Secondary | ICD-10-CM | POA: Diagnosis not present

## 2020-12-28 DIAGNOSIS — H52222 Regular astigmatism, left eye: Secondary | ICD-10-CM | POA: Insufficient documentation

## 2020-12-28 DIAGNOSIS — H02831 Dermatochalasis of right upper eyelid: Secondary | ICD-10-CM | POA: Diagnosis not present

## 2020-12-28 DIAGNOSIS — Z9841 Cataract extraction status, right eye: Secondary | ICD-10-CM | POA: Diagnosis not present

## 2020-12-28 NOTE — Progress Notes (Signed)
[name],   Ct does not show any signs of appendicitis, hernia or acute findings.  Your discomfort is likely due to a strain. Would you like to try some PT(physical therapy)first and see if symptoms resolve?  You have some stable inflammation in the bladder

## 2021-01-12 DIAGNOSIS — L570 Actinic keratosis: Secondary | ICD-10-CM | POA: Diagnosis not present

## 2021-01-12 DIAGNOSIS — L820 Inflamed seborrheic keratosis: Secondary | ICD-10-CM | POA: Diagnosis not present

## 2021-01-12 DIAGNOSIS — D235 Other benign neoplasm of skin of trunk: Secondary | ICD-10-CM | POA: Diagnosis not present

## 2021-01-12 DIAGNOSIS — L821 Other seborrheic keratosis: Secondary | ICD-10-CM | POA: Diagnosis not present

## 2021-02-01 ENCOUNTER — Encounter: Payer: Self-pay | Admitting: Osteopathic Medicine

## 2021-02-08 ENCOUNTER — Other Ambulatory Visit: Payer: Self-pay | Admitting: *Deleted

## 2021-02-08 DIAGNOSIS — Z87891 Personal history of nicotine dependence: Secondary | ICD-10-CM

## 2021-03-08 ENCOUNTER — Encounter: Payer: Self-pay | Admitting: Osteopathic Medicine

## 2021-03-08 ENCOUNTER — Ambulatory Visit (INDEPENDENT_AMBULATORY_CARE_PROVIDER_SITE_OTHER): Payer: Medicare PPO | Admitting: Osteopathic Medicine

## 2021-03-08 ENCOUNTER — Other Ambulatory Visit: Payer: Self-pay

## 2021-03-08 VITALS — BP 132/76 | HR 73 | Temp 97.6°F | Wt 186.0 lb

## 2021-03-08 DIAGNOSIS — E119 Type 2 diabetes mellitus without complications: Secondary | ICD-10-CM

## 2021-03-08 LAB — POCT GLYCOSYLATED HEMOGLOBIN (HGB A1C): Hemoglobin A1C: 6.6 % — AB (ref 4.0–5.6)

## 2021-03-08 MED ORDER — METFORMIN HCL ER 500 MG PO TB24: 500.0000 mg | ORAL_TABLET | Freq: Every day | ORAL | 3 refills | Status: DC

## 2021-03-08 NOTE — Progress Notes (Signed)
Patrick Perkins is a 75 y.o. male who presents to  [place] [facility]  today, 03/08/21, seeking care for the following:  . DM2 F/U: A1C 6.6 today, up a bit from 6.2 6 mos ago, stable from 6.6 a year ago.  Patient has no other questions or concerns today   Wt Readings from Last 3 Encounters:  03/08/21 186 lb (84.4 kg)  12/15/20 187 lb (84.8 kg)  09/07/20 185 lb 1.3 oz (84 kg)   BP Readings from Last 3 Encounters:  03/08/21 132/76  12/15/20 140/60  09/07/20 (!) 164/78     ASSESSMENT & PLAN with other pertinent findings:  The encounter diagnosis was Type 2 diabetes mellitus without complication, without long-term current use of insulin ([place]).   Continue current medications, no concerns based on A1c.   Patient Instructions   Immunization History  Administered Date(s) Administered  . Fluad Quad(high Dose 65+) 09/05/2019, 09/07/2020  . H1N1 12/14/2008  . Influenza Split 09/04/2011, 09/03/2012  . Influenza Whole 09/06/2009, 09/20/2010, 08/20/2013  . Influenza, High Dose Seasonal PF 09/18/2017  . Influenza,inj,Quad PF,6+ Mos 09/10/2018  . Influenza-Unspecified 08/20/2014, 07/22/2015, 09/03/2016  . PFIZER(Purple Top)SARS-COV-2 Vaccination 03/02/2020, 03/22/2020  . Pneumococcal Conjugate-13 04/23/2015  . Pneumococcal Polysaccharide-23 09/20/2010, 04/03/2017  . Td 12/14/2008  . Tdap 10/29/2018  . Zoster 02/29/2012  . Zoster Recombinat (Shingrix) 04/07/2017, 06/21/2017      Orders Placed This Encounter  Procedures  . POCT HgB A1C    Meds ordered this encounter  Medications  . metFORMIN (GLUCOPHAGE-XR) 500 MG 24 hr tablet    Sig: Take 1 tablet (500 mg total) by mouth daily with breakfast.    Dispense:  90 tablet    Refill:  3     See below for relevant physical exam findings  See below for recent lab and imaging results reviewed  Medications, allergies, PMH, PSH, SocH, FamH reviewed below    Follow-up  instructions: Return in about 6 months (around 09/07/2021) for MEDICARE WELLNESS W/ RN, LABS (FASTING - LIPIDS, CBC, CMP, A1C), VISIT W/ DR A WEEK AFTER .                                        Exam:  BP 132/76 (BP Location: Left Arm, Patient Position: Sitting, Cuff Size: Normal)   Pulse 73   Temp 97.6 F (36.4 C) (Oral)   Wt 186 lb (84.4 kg)   BMI 28.28 kg/m   Constitutional: VS see above. General Appearance: alert, well-developed, well-nourished, NAD  Neck: No masses, trachea midline.   Respiratory: Normal respiratory effort. no wheeze, no rhonchi, no rales  Cardiovascular: S1/S2 normal, no murmur, no rub/gallop auscultated. RRR.   Musculoskeletal: Gait normal. Symmetric and independent movement of all extremities  Neurological: Normal balance/coordination. No tremor.  Skin: warm, dry, intact.   Psychiatric: Normal judgment/insight. Normal mood and affect. Oriented x3.   Current Meds  Medication Sig  . AMBULATORY NON FORMULARY MEDICATION One Touch Verio glucose monitor test strips and lancets.  Check blood sugar daily. Dx type 2 diabetes. E11.9  . aspirin 81 MG tablet Take 81 mg by mouth daily.  . enalapril (VASOTEC) 10 MG tablet Take 1 tablet (10 mg total) by mouth daily.  Marland Kitchen glucose blood (ACCU-CHEK AVIVA) test strip Test blood sugar twice daily. Dx: E11.9  . lovastatin (MEVACOR) 40 MG tablet Take 0.5 tablets (20  mg total) by mouth at bedtime.  . Multiple Vitamins-Minerals (MULTIVITAMIN PO) Take by mouth.  . primidone (MYSOLINE) 250 MG tablet Take 250 mg by mouth at bedtime.  . primidone (MYSOLINE) 50 MG tablet Take by mouth at bedtime.  . propranolol (INDERAL) 10 MG tablet Take 10 mg by mouth in the morning and at bedtime.  . [DISCONTINUED] metFORMIN (GLUCOPHAGE-XR) 500 MG 24 hr tablet Take 1 tablet by mouth once daily with breakfast    No Known Allergies  Patient Active Problem List   Diagnosis Date Noted  . Recurrent right  inguinal hernia 12/16/2020  . Neuroforaminal stenosis of cervical spine 04/26/2018  . Degenerative disc disease, cervical 04/26/2018  . Left hip pain 04/26/2018  . Cervical radiculitis 07/09/2014  . Ganglion cyst 03/23/2014  . Encounter for monitoring statin therapy 02/24/2013  . Benign essential tremor 08/31/2011  . Hyperlipemia 08/31/2011  . Type 2 diabetes mellitus ([place]) 01/07/2010  . COLONIC POLYPS 01/01/2009  . DIVERTICULOSIS OF COLON 01/01/2009  . Former smoker 12/14/2008    Family History  Problem Relation Age of Onset  . Cancer Father 38       colon    Social History   Tobacco Use  Smoking Status Former Smoker  . Quit date: 09/30/2009  . Years since quitting: 11.4  Smokeless Tobacco Never Used    Past Surgical History:  Procedure Laterality Date  . HERNIA REPAIR  2005   double    Immunization History  Administered Date(s) Administered  . Fluad Quad(high Dose 65+) 09/05/2019, 09/07/2020  . H1N1 12/14/2008  . Influenza Split 09/04/2011, 09/03/2012  . Influenza Whole 09/06/2009, 09/20/2010, 08/20/2013  . Influenza, High Dose Seasonal PF 09/18/2017  . Influenza,inj,Quad PF,6+ Mos 09/10/2018  . Influenza-Unspecified 08/20/2014, 07/22/2015, 09/03/2016  . PFIZER(Purple Top)SARS-COV-2 Vaccination 03/02/2020, 03/22/2020  . Pneumococcal Conjugate-13 04/23/2015  . Pneumococcal Polysaccharide-23 09/20/2010, 04/03/2017  . Td 12/14/2008  . Tdap 10/29/2018  . Zoster 02/29/2012  . Zoster Recombinat (Shingrix) 04/07/2017, 06/21/2017    Recent Results (from the past 2160 hour(s))  COMPLETE METABOLIC PANEL WITH GFR     Status: Abnormal   Collection Time: 12/17/20 12:00 AM  Result Value Ref Range   Glucose, Bld 122 (H) 65 - 99 mg/dL    Comment: .            Fasting reference interval . For someone without known diabetes, a glucose value between 100 and 125 mg/dL is consistent with prediabetes and should be confirmed with a follow-up test. .    BUN 14 7 - 25  mg/dL   Creat 0.92 0.70 - 1.18 mg/dL    Comment: For patients >15 years of age, the reference limit for Creatinine is approximately 13% higher for people identified as African-American. .    GFR, Est Non African American 82 > OR = 60 mL/min/1.31m2   GFR, Est African American 95 > OR = 60 mL/min/1.70m2   BUN/Creatinine Ratio NOT APPLICABLE 6 - 22 (calc)   Sodium 142 135 - 146 mmol/L   Potassium 5.0 3.5 - 5.3 mmol/L   Chloride 101 98 - 110 mmol/L   CO2 30 20 - 32 mmol/L   Calcium 9.8 8.6 - 10.3 mg/dL   Total Protein 7.7 6.1 - 8.1 g/dL   Albumin 4.7 3.6 - 5.1 g/dL   Globulin 3.0 1.9 - 3.7 g/dL (calc)   AG Ratio 1.6 1.0 - 2.5 (calc)   Total Bilirubin 0.4 0.2 - 1.2 mg/dL   Alkaline phosphatase (APISO) 90 35 - 144  U/L   AST 23 10 - 35 U/L   ALT 32 9 - 46 U/L  POCT HgB A1C     Status: Abnormal   Collection Time: 03/08/21  8:54 AM  Result Value Ref Range   Hemoglobin A1C 6.6 (A) 4.0 - 5.6 %   HbA1c POC (<> result, manual entry)     HbA1c, POC (prediabetic range)     HbA1c, POC (controlled diabetic range)      No results found.     All questions at time of visit were answered - patient instructed to contact office with any additional concerns or updates. ER/RTC precautions were reviewed with the patient as applicable.   Please note: manual typing as well as voice recognition software may have been used to produce this document - typos may escape review. Please contact Dr. Sheppard Coil for any needed clarifications.

## 2021-03-08 NOTE — Patient Instructions (Signed)
Immunization History  Administered Date(s) Administered  . Fluad Quad(high Dose 65+) 09/05/2019, 09/07/2020  . H1N1 12/14/2008  . Influenza Split 09/04/2011, 09/03/2012  . Influenza Whole 09/06/2009, 09/20/2010, 08/20/2013  . Influenza, High Dose Seasonal PF 09/18/2017  . Influenza,inj,Quad PF,6+ Mos 09/10/2018  . Influenza-Unspecified 08/20/2014, 07/22/2015, 09/03/2016  . PFIZER(Purple Top)SARS-COV-2 Vaccination 03/02/2020, 03/22/2020  . Pneumococcal Conjugate-13 04/23/2015  . Pneumococcal Polysaccharide-23 09/20/2010, 04/03/2017  . Td 12/14/2008  . Tdap 10/29/2018  . Zoster 02/29/2012  . Zoster Recombinat (Shingrix) 04/07/2017, 06/21/2017

## 2021-04-28 DIAGNOSIS — M542 Cervicalgia: Secondary | ICD-10-CM | POA: Diagnosis not present

## 2021-04-28 DIAGNOSIS — M5412 Radiculopathy, cervical region: Secondary | ICD-10-CM | POA: Diagnosis not present

## 2021-04-28 DIAGNOSIS — G250 Essential tremor: Secondary | ICD-10-CM | POA: Diagnosis not present

## 2021-06-25 ENCOUNTER — Encounter: Payer: Self-pay | Admitting: Nurse Practitioner

## 2021-06-25 ENCOUNTER — Telehealth: Payer: Medicare PPO | Admitting: Nurse Practitioner

## 2021-06-25 DIAGNOSIS — U071 COVID-19: Secondary | ICD-10-CM | POA: Diagnosis not present

## 2021-06-25 MED ORDER — MOLNUPIRAVIR EUA 200MG CAPSULE: 4.0000 | ORAL_CAPSULE | Freq: Two times a day (BID) | ORAL | 0 refills | Status: AC

## 2021-06-25 NOTE — Patient Instructions (Signed)
You are being prescribed MOLNUPIRAVIR for COVID-19 infection.  ° ° °Please call the pharmacy or go through the drive through vs going inside if you are picking up the mediation yourself to prevent further spread. If prescribed to a [facility] affiliated pharmacy, a pharmacist will bring the medication out to your car. ° ° °ADMINISTRATION INSTRUCTIONS: °Take with or without food. Swallow the tablets whole. Don't chew, crush, or break the medications because it might not work as well ° °For each dose of the medication, you should be taking FOUR tablets at one time, TWICE a day  ° °Finish your full five-day course of Molnupiravir even if you feel better before you're done. Stopping this medication too early can make it less effective to prevent severe illness related to COVID19.   ° °Molnupiravir is prescribed for YOU ONLY. Don't share it with others, even if they have similar symptoms as you. This medication might not be right for everyone.  ° °Make sure to take steps to protect yourself and others while you're taking this medication in order to get well soon and to prevent others from getting sick with COVID-19. ° ° °**If you are of childbearing potential (any gender) - it is advised to not get pregnant while taking this medication and recommended that condoms are used for male partners the next 3 months after taking the medication out of extreme caution  ° ° °COMMON SIDE EFFECTS: °Diarrhea °Nausea  °Dizziness ° ° ° °If your COVID-19 symptoms get worse, get medical help right away. Call 911 if you experience symptoms such as worsening cough, trouble breathing, chest pain that doesn't go away, confusion, a hard time staying awake, and pale or blue-colored skin. °This medication won't prevent all COVID-19 cases from getting worse.  ° ° °

## 2021-06-25 NOTE — Progress Notes (Signed)
Virtual Visit Consent   Patrick Perkins, you are scheduled for a virtual visit with a [facility] provider today.     Just as with appointments in the office, your consent must be obtained to participate.  Your consent will be active for this visit and any virtual visit you may have with one of our providers in the next 365 days.     If you have a MyChart account, a copy of this consent can be sent to you electronically.  All virtual visits are billed to your insurance company just like a traditional visit in the office.    As this is a virtual visit, video technology does not allow for your provider to perform a traditional examination.  This may limit your provider's ability to fully assess your condition.  If your provider identifies any concerns that need to be evaluated in person or the need to arrange testing (such as labs, EKG, etc.), we will make arrangements to do so.     Although advances in technology are sophisticated, we cannot ensure that it will always work on either your end or our end.  If the connection with a video visit is poor, the visit may have to be switched to a telephone visit.  With either a video or telephone visit, we are not always able to ensure that we have a secure connection.     I need to obtain your verbal consent now.   Are you willing to proceed with your visit today?    Patrick Perkins has provided verbal consent on 06/25/2021 for a virtual visit (video or telephone).   Apolonio Schneiders, FNP   Date: 06/25/2021 11:25 AM   Virtual Visit via Video Note   I, Apolonio Schneiders, connected with  Patrick Perkins  ([id number], 1945/12/20) on 06/25/21 at 10:30 AM EDT by a telephone call/ telemedicine application and verified that I am speaking with the correct person using two identifiers.  Location: Patient: Virtual Visit Location Patient: Home Provider: Virtual Visit Location Provider: Office/Clinic   I discussed the limitations of evaluation and management by  telemedicine and the availability of in person appointments. The patient expressed understanding and agreed to proceed.    History of Present Illness: Patrick Perkins is a 75 y.o. who identifies as a male who was assigned male at birth, and is being seen today after testing positive for COVID-19 on 06/24/2021. His symptoms started the night prior 06/23/2021.  He has not had COVID in the past  He has been vaccinated X2 for COVID-19 no booster yet.   Today he has a raw sore throat and a productive cough low grade fever 100 no acute distress  He denies a history of COPD or asthma Has a history of smoking quit 11 year ago.   He has not needed to use an inhaler in the past He denies a history of pneumonia    Problems:  Patient Active Problem List   Diagnosis Date Noted   Recurrent right inguinal hernia 12/16/2020   Neuroforaminal stenosis of cervical spine 04/26/2018   Degenerative disc disease, cervical 04/26/2018   Left hip pain 04/26/2018   Cervical radiculitis 07/09/2014   Ganglion cyst 03/23/2014   Encounter for monitoring statin therapy 02/24/2013   Benign essential tremor 08/31/2011   Hyperlipemia 08/31/2011   Type 2 diabetes mellitus ([place]) 01/07/2010   COLONIC POLYPS 01/01/2009   DIVERTICULOSIS OF COLON 01/01/2009   Former smoker 12/14/2008    Allergies: No Known Allergies  Medications:  Current Outpatient Medications:    AMBULATORY NON FORMULARY MEDICATION, One Touch Verio glucose monitor test strips and lancets.  Check blood sugar daily. Dx type 2 diabetes. E11.9, Disp: 50 Units, Rfl: 11   aspirin 81 MG tablet, Take 81 mg by mouth daily., Disp: , Rfl:    enalapril (VASOTEC) 10 MG tablet, Take 1 tablet (10 mg total) by mouth daily., Disp: 90 tablet, Rfl: 3   glucose blood (ACCU-CHEK AVIVA) test strip, Test blood sugar twice daily. Dx: E11.9, Disp: 100 each, Rfl: prn   lovastatin (MEVACOR) 40 MG tablet, Take 0.5 tablets (20 mg total) by mouth at bedtime., Disp: 90 tablet,  Rfl: 3   metFORMIN (GLUCOPHAGE-XR) 500 MG 24 hr tablet, Take 1 tablet (500 mg total) by mouth daily with breakfast., Disp: 90 tablet, Rfl: 3   Multiple Vitamins-Minerals (MULTIVITAMIN PO), Take by mouth., Disp: , Rfl:    primidone (MYSOLINE) 250 MG tablet, Take 250 mg by mouth at bedtime., Disp: , Rfl:    primidone (MYSOLINE) 50 MG tablet, Take by mouth at bedtime., Disp: , Rfl:    propranolol (INDERAL) 10 MG tablet, Take 10 mg by mouth in the morning and at bedtime., Disp: , Rfl:   Observations/Objective:  Resting comfortably at home.   No labored breathing.  Speech is clear and coherent with logical content.  Patient is alert and oriented at baseline.    Assessment and Plan: 1. COVID-19  - molnupiravir EUA 200 mg CAPS; Take 4 capsules (800 mg total) by mouth 2 (two) times daily for 5 days.  Dispense: 40 capsule; Refill: 0    Discussed over the counter medications he may take for symptom support as well including tylenol, Cepacol lozenges and an expectorant without decongestant or coricidin  Follow Up Instructions: I discussed the assessment and treatment plan with the patient. The patient was provided an opportunity to ask questions and all were answered. The patient agreed with the plan and demonstrated an understanding of the instructions.  A copy of instructions were sent to the patient via MyChart.  The patient was advised to call back or seek an in-person evaluation if the symptoms worsen or if the condition fails to improve as anticipated.  Time:  I spent 20 minutes with the patient via telehealth technology discussing the above problems/concerns.    Apolonio Schneiders, FNP

## 2021-09-06 ENCOUNTER — Ambulatory Visit (INDEPENDENT_AMBULATORY_CARE_PROVIDER_SITE_OTHER): Payer: Medicare PPO | Admitting: Medical-Surgical

## 2021-09-06 ENCOUNTER — Other Ambulatory Visit: Payer: Self-pay

## 2021-09-06 ENCOUNTER — Encounter: Payer: Self-pay | Admitting: Medical-Surgical

## 2021-09-06 ENCOUNTER — Encounter: Payer: Medicare PPO | Admitting: Osteopathic Medicine

## 2021-09-06 VITALS — BP 145/76 | HR 68 | Resp 20 | Ht 68.0 in | Wt 174.0 lb

## 2021-09-06 DIAGNOSIS — E785 Hyperlipidemia, unspecified: Secondary | ICD-10-CM

## 2021-09-06 DIAGNOSIS — E119 Type 2 diabetes mellitus without complications: Secondary | ICD-10-CM | POA: Diagnosis not present

## 2021-09-06 DIAGNOSIS — Z7689 Persons encountering health services in other specified circumstances: Secondary | ICD-10-CM | POA: Diagnosis not present

## 2021-09-06 DIAGNOSIS — Z23 Encounter for immunization: Secondary | ICD-10-CM | POA: Diagnosis not present

## 2021-09-06 DIAGNOSIS — Z125 Encounter for screening for malignant neoplasm of prostate: Secondary | ICD-10-CM

## 2021-09-06 DIAGNOSIS — Z0000 Encounter for general adult medical examination without abnormal findings: Secondary | ICD-10-CM

## 2021-09-06 LAB — POCT GLYCOSYLATED HEMOGLOBIN (HGB A1C): HbA1c, POC (controlled diabetic range): 5.8 % (ref 0.0–7.0)

## 2021-09-06 NOTE — Progress Notes (Signed)
HPI: Patrick Perkins is a 75 y.o. male who  has a past medical history of Diabetes mellitus and ED (erectile dysfunction).  he presents to [facility] today, 09/06/21,  for chief complaint of: Annual physical exam  Dentist: UTD Eye exam: UTD Exercise: stays active Diet: well balanced Colon cancer screening: UTD Prostate cancer screening: doing today COVID vaccine: needs booster, discussed  Concerns: None  Past medical, surgical, social and family history reviewed:  Patient Active Problem List   Diagnosis Date Noted   Recurrent right inguinal hernia 12/16/2020   Neuroforaminal stenosis of cervical spine 04/26/2018   Degenerative disc disease, cervical 04/26/2018   Left hip pain 04/26/2018   Cervical radiculitis 07/09/2014   Ganglion cyst 03/23/2014   Encounter for monitoring statin therapy 02/24/2013   Benign essential tremor 08/31/2011   Hyperlipemia 08/31/2011   Type 2 diabetes mellitus ([place]) 01/07/2010   COLONIC POLYPS 01/01/2009   DIVERTICULOSIS OF COLON 01/01/2009   Former smoker 12/14/2008    Past Surgical History:  Procedure Laterality Date   HERNIA REPAIR  2005   double    Social History   Tobacco Use   Smoking status: Former    Types: Cigarettes    Quit date: 09/30/2009    Years since quitting: 11.9   Smokeless tobacco: Never  Substance Use Topics   Alcohol use: Yes    Alcohol/week: 1.0 standard drink    Types: 1 Cans of beer per week    Comment: occasionally    Family History  Problem Relation Age of Onset   Cancer Father 88       colon     Current medication list and allergy/intolerance information reviewed:    Current Outpatient Medications  Medication Sig Dispense Refill   AMBULATORY NON FORMULARY MEDICATION One Touch Verio glucose monitor test strips and lancets.  Check blood sugar daily. Dx type 2 diabetes. E11.9 50 Units 11   aspirin 81 MG tablet Take 81 mg by mouth daily.     Aspirin  Buf,CaCarb-MgCarb-MgO, 81 MG TABS Take by mouth.     enalapril (VASOTEC) 10 MG tablet Take 1 tablet (10 mg total) by mouth daily. 90 tablet 3   glucose blood (ACCU-CHEK AVIVA) test strip Test blood sugar twice daily. Dx: E11.9 100 each prn   lovastatin (MEVACOR) 40 MG tablet Take 0.5 tablets (20 mg total) by mouth at bedtime. 90 tablet 3   metFORMIN (GLUCOPHAGE-XR) 500 MG 24 hr tablet Take 1 tablet (500 mg total) by mouth daily with breakfast. 90 tablet 3   Multiple Vitamins-Minerals (MULTIVITAMIN PO) Take by mouth.     primidone (MYSOLINE) 250 MG tablet Take 250 mg by mouth at bedtime.     primidone (MYSOLINE) 50 MG tablet Take by mouth at bedtime.     propranolol (INDERAL) 10 MG tablet Take 10 mg by mouth in the morning and at bedtime.     No current facility-administered medications for this visit.    No Known Allergies    Review of Systems: Constitutional:  No  fever, no chills, No recent illness, No unintentional weight changes. No significant fatigue.  HEENT: No  headache, no vision change, no hearing change, No sore throat, No  sinus pressure Cardiac: No  chest pain, No  pressure, No palpitations, No  Orthopnea Respiratory:  No  shortness of breath. No  Cough Gastrointestinal: No  abdominal pain, No  nausea, No  vomiting,  No  blood in stool, No  diarrhea, No  constipation  Musculoskeletal: No new myalgia/arthralgia Skin: No  Rash, No other wounds/concerning lesions Genitourinary: No  incontinence, No  abnormal genital bleeding, No abnormal genital discharge Hem/Onc: No  easy bruising/bleeding, No  abnormal lymph node Endocrine: No cold intolerance,  No heat intolerance. No polyuria/polydipsia/polyphagia  Neurologic: No  weakness, No  dizziness, No  slurred speech/focal weakness/facial droop Psychiatric: No  concerns with depression, No  concerns with anxiety, No sleep problems, No mood problems  Exam:  BP (!) 145/76 (BP Location: Left Arm, Patient Position: Sitting, Cuff Size:  Normal)   Pulse 68   Resp 20   Ht 5\' 8"  (1.727 m)   Wt 174 lb (78.9 kg)   SpO2 98%   BMI 26.46 kg/m  Constitutional: VS see above. General Appearance: alert, well-developed, well-nourished, NAD Eyes: Normal lids and conjunctive, non-icteric sclera Ears, Nose, Mouth, Throat: MMM, Normal external inspection ears/nares/mouth/lips/gums. TM normal bilaterally.   Neck: No masses, trachea midline. No thyroid enlargement. No tenderness/mass appreciated. No lymphadenopathy Respiratory: Normal respiratory effort. no wheeze, no rhonchi, no rales Cardiovascular: S1/S2 normal, no murmur, no rub/gallop auscultated. RRR. No lower extremity edema. Pedal pulse II/IV bilaterally PT. No carotid bruit or JVD. No abdominal aortic bruit. Gastrointestinal: Nontender, no masses. No hepatomegaly, no splenomegaly. No hernia appreciated. Bowel sounds normal. Rectal exam deferred.  Musculoskeletal: Gait normal. No clubbing/cyanosis of digits.  Neurological: Normal balance/coordination. No tremor. No cranial nerve deficit on limited exam. Motor and sensation intact and symmetric. Cerebellar reflexes intact.  Skin: warm, dry, intact. No rash/ulcer. No concerning nevi or subq nodules on limited exam.   Psychiatric: Normal judgment/insight. Normal mood and affect. Oriented x3.    ASSESSMENT/PLAN:   1. Encounter to establish care Reviewed available information and discussed care concerns with patient.   2. Annual physical exam Checking CBC with differential, CMP, and lipid panel today.  Wellness information provided with AVS. - CBC with Differential/Platelet - COMPLETE METABOLIC PANEL WITH GFR - Lipid panel  3. Type 2 diabetes mellitus without complication, without long-term current use of insulin ([place]) Checking labs today.  POCT hemoglobin A1c 5.8% indicating excellent control.  Continue metformin. - COMPLETE METABOLIC PANEL WITH GFR - Lipid panel - POCT HgB A1C  4. Hyperlipidemia, unspecified hyperlipidemia  type Checking lipid panel today. - Lipid panel  5. Prostate cancer screening Checking PSA. - PSA  6. Flu vaccine need Flu vaccine given in office today. - Flu Vaccine QUAD High Dose(Fluad)  Orders Placed This Encounter  Procedures   Flu Vaccine QUAD High Dose(Fluad)   CBC with Differential/Platelet   COMPLETE METABOLIC PANEL WITH GFR   Lipid panel   PSA   POCT HgB A1C    No orders of the defined types were placed in this encounter.   Patient Instructions  Preventive Care 20 Years and Older, Male Preventive care refers to lifestyle choices and visits with your health care provider that can promote health and wellness. This includes: A yearly physical exam. This is also called an annual wellness visit. Regular dental and eye exams. Immunizations. Screening for certain conditions. Healthy lifestyle choices, such as: Eating a healthy diet. Getting regular exercise. Not using drugs or products that contain nicotine and tobacco. Limiting alcohol use. What can I expect for my preventive care visit? Physical exam Your health care provider will check your: Height and weight. These may be used to calculate your BMI (body mass index). BMI is a measurement that tells if you are at a healthy weight. Heart rate and blood pressure.  Body temperature. Skin for abnormal spots. Counseling Your health care provider may ask you questions about your: Past medical problems. Family's medical history. Alcohol, tobacco, and drug use. Emotional well-being. Home life and relationship well-being. Sexual activity. Diet, exercise, and sleep habits. History of falls. Memory and ability to understand (cognition). Work and work Statistician. Access to firearms. What immunizations do I need? Vaccines are usually given at various ages, according to a schedule. Your health care provider will recommend vaccines for you based on your age, medical history, and lifestyle or other factors, such as  travel or where you work. What tests do I need? Blood tests Lipid and cholesterol levels. These may be checked every 5 years, or more often depending on your overall health. Hepatitis C test. Hepatitis B test. Screening Lung cancer screening. You may have this screening every year starting at age 25 if you have a 30-pack-year history of smoking and currently smoke or have quit within the past 15 years. Colorectal cancer screening. All adults should have this screening starting at age 60 and continuing until age 23. Your health care provider may recommend screening at age 74 if you are at increased risk. You will have tests every 1-10 years, depending on your results and the type of screening test. Prostate cancer screening. Recommendations will vary depending on your family history and other risks. Genital exam to check for testicular cancer or hernias. Diabetes screening. This is done by checking your blood sugar (glucose) after you have not eaten for a while (fasting). You may have this done every 1-3 years. Abdominal aortic aneurysm (AAA) screening. You may need this if you are a current or former smoker. STD (sexually transmitted disease) testing, if you are at risk. Follow these instructions at home: Eating and drinking  Eat a diet that includes fresh fruits and vegetables, whole grains, lean protein, and low-fat dairy products. Limit your intake of foods with high amounts of sugar, saturated fats, and salt. Take vitamin and mineral supplements as recommended by your health care provider. Do not drink alcohol if your health care provider tells you not to drink. If you drink alcohol: Limit how much you have to 0-2 drinks a day. Be aware of how much alcohol is in your drink. In the [place] one drink equals one 12 oz bottle of beer (355 mL), one 5 oz glass of wine (148 mL), or one 1 oz glass of hard liquor (44 mL). Lifestyle Take daily care of your teeth and gums. Brush your teeth every  morning and night with fluoride toothpaste. Floss one time each day. Stay active. Exercise for at least 30 minutes 5 or more days each week. Do not use any products that contain nicotine or tobacco, such as cigarettes, e-cigarettes, and chewing tobacco. If you need help quitting, ask your health care provider. Do not use drugs. If you are sexually active, practice safe sex. Use a condom or other form of protection to prevent STIs (sexually transmitted infections). Talk with your health care provider about taking a low-dose aspirin or statin. Find healthy ways to cope with stress, such as: Meditation, yoga, or listening to music. Journaling. Talking to a trusted person. Spending time with friends and family. Safety Always wear your seat belt while driving or riding in a vehicle. Do not drive: If you have been drinking alcohol. Do not ride with someone who has been drinking. When you are tired or distracted. While texting. Wear a helmet and other protective equipment during sports activities.  If you have firearms in your house, make sure you follow all gun safety procedures. What's next? Visit your health care provider once a year for an annual wellness visit. Ask your health care provider how often you should have your eyes and teeth checked. Stay up to date on all vaccines. This information is not intended to replace advice given to you by your health care provider. Make sure you discuss any questions you have with your health care provider. Document Revised: 01/14/2021 Document Reviewed: 10/31/2018 Elsevier Patient Education  2022 [employer].   Follow-up plan: Return in about 6 months (around 03/07/2022) for DM/HTN/HLD follow up.  Clearnce Sorrel, DNP, APRN, FNP-BC [facility]

## 2021-09-06 NOTE — Patient Instructions (Signed)
Preventive Care 74 Years and Older, Male Preventive care refers to lifestyle choices and visits with your health care provider that can promote health and wellness. This includes: A yearly physical exam. This is also called an annual wellness visit. Regular dental and eye exams. Immunizations. Screening for certain conditions. Healthy lifestyle choices, such as: Eating a healthy diet. Getting regular exercise. Not using drugs or products that contain nicotine and tobacco. Limiting alcohol use. What can I expect for my preventive care visit? Physical exam Your health care provider will check your: Height and weight. These may be used to calculate your BMI (body mass index). BMI is a measurement that tells if you are at a healthy weight. Heart rate and blood pressure. Body temperature. Skin for abnormal spots. Counseling Your health care provider may ask you questions about your: Past medical problems. Family's medical history. Alcohol, tobacco, and drug use. Emotional well-being. Home life and relationship well-being. Sexual activity. Diet, exercise, and sleep habits. History of falls. Memory and ability to understand (cognition). Work and work Statistician. Access to firearms. What immunizations do I need? Vaccines are usually given at various ages, according to a schedule. Your health care provider will recommend vaccines for you based on your age, medical history, and lifestyle or other factors, such as travel or where you work. What tests do I need? Blood tests Lipid and cholesterol levels. These may be checked every 5 years, or more often depending on your overall health. Hepatitis C test. Hepatitis B test. Screening Lung cancer screening. You may have this screening every year starting at age 70 if you have a 30-pack-year history of smoking and currently smoke or have quit within the past 15 years. Colorectal cancer screening. All adults should have this screening  starting at age 68 and continuing until age 35. Your health care provider may recommend screening at age 86 if you are at increased risk. You will have tests every 1-10 years, depending on your results and the type of screening test. Prostate cancer screening. Recommendations will vary depending on your family history and other risks. Genital exam to check for testicular cancer or hernias. Diabetes screening. This is done by checking your blood sugar (glucose) after you have not eaten for a while (fasting). You may have this done every 1-3 years. Abdominal aortic aneurysm (AAA) screening. You may need this if you are a current or former smoker. STD (sexually transmitted disease) testing, if you are at risk. Follow these instructions at home: Eating and drinking  Eat a diet that includes fresh fruits and vegetables, whole grains, lean protein, and low-fat dairy products. Limit your intake of foods with high amounts of sugar, saturated fats, and salt. Take vitamin and mineral supplements as recommended by your health care provider. Do not drink alcohol if your health care provider tells you not to drink. If you drink alcohol: Limit how much you have to 0-2 drinks a day. Be aware of how much alcohol is in your drink. In the [place] one drink equals one 12 oz bottle of beer (355 mL), one 5 oz glass of wine (148 mL), or one 1 oz glass of hard liquor (44 mL). Lifestyle Take daily care of your teeth and gums. Brush your teeth every morning and night with fluoride toothpaste. Floss one time each day. Stay active. Exercise for at least 30 minutes 5 or more days each week. Do not use any products that contain nicotine or tobacco, such as cigarettes, e-cigarettes, and chewing tobacco. If  you need help quitting, ask your health care provider. Do not use drugs. If you are sexually active, practice safe sex. Use a condom or other form of protection to prevent STIs (sexually transmitted infections). Talk  with your health care provider about taking a low-dose aspirin or statin. Find healthy ways to cope with stress, such as: Meditation, yoga, or listening to music. Journaling. Talking to a trusted person. Spending time with friends and family. Safety Always wear your seat belt while driving or riding in a vehicle. Do not drive: If you have been drinking alcohol. Do not ride with someone who has been drinking. When you are tired or distracted. While texting. Wear a helmet and other protective equipment during sports activities. If you have firearms in your house, make sure you follow all gun safety procedures. What's next? Visit your health care provider once a year for an annual wellness visit. Ask your health care provider how often you should have your eyes and teeth checked. Stay up to date on all vaccines. This information is not intended to replace advice given to you by your health care provider. Make sure you discuss any questions you have with your health care provider. Document Revised: 01/14/2021 Document Reviewed: 10/31/2018 Elsevier Patient Education  2022 [employer].

## 2021-09-07 LAB — CBC WITH DIFFERENTIAL/PLATELET
Absolute Monocytes: 784 cells/uL (ref 200–950)
Basophils Absolute: 48 cells/uL (ref 0–200)
Basophils Relative: 0.6 %
Eosinophils Absolute: 248 cells/uL (ref 15–500)
Eosinophils Relative: 3.1 %
HCT: 42.3 % (ref 38.5–50.0)
Hemoglobin: 14.4 g/dL (ref 13.2–17.1)
Lymphs Abs: 2320 cells/uL (ref 850–3900)
MCH: 34 pg — ABNORMAL HIGH (ref 27.0–33.0)
MCHC: 34 g/dL (ref 32.0–36.0)
MCV: 99.8 fL (ref 80.0–100.0)
MPV: 11.5 fL (ref 7.5–12.5)
Monocytes Relative: 9.8 %
Neutro Abs: 4600 cells/uL (ref 1500–7800)
Neutrophils Relative %: 57.5 %
Platelets: 302 10*3/uL (ref 140–400)
RBC: 4.24 10*6/uL (ref 4.20–5.80)
RDW: 12.3 % (ref 11.0–15.0)
Total Lymphocyte: 29 %
WBC: 8 10*3/uL (ref 3.8–10.8)

## 2021-09-07 LAB — LIPID PANEL
Cholesterol: 187 mg/dL (ref <200)
HDL: 73 mg/dL (ref >=40)
LDL Cholesterol (Calc): 86 mg/dL (calc)
Non-HDL Cholesterol (Calc): 114 mg/dL (calc) (ref <130)
Total CHOL/HDL Ratio: 2.6 (calc) (ref <5.0)
Triglycerides: 179 mg/dL — ABNORMAL HIGH (ref <150)

## 2021-09-07 LAB — COMPLETE METABOLIC PANEL WITH GFR
AG Ratio: 1.7 (calc) (ref 1.0–2.5)
ALT: 16 U/L (ref 9–46)
AST: 16 U/L (ref 10–35)
Albumin: 4.7 g/dL (ref 3.6–5.1)
Alkaline phosphatase (APISO): 95 U/L (ref 35–144)
BUN: 13 mg/dL (ref 7–25)
CO2: 29 mmol/L (ref 20–32)
Calcium: 9.5 mg/dL (ref 8.6–10.3)
Chloride: 101 mmol/L (ref 98–110)
Creat: 1 mg/dL (ref 0.70–1.28)
Globulin: 2.8 g/dL (calc) (ref 1.9–3.7)
Glucose, Bld: 146 mg/dL — ABNORMAL HIGH (ref 65–99)
Potassium: 5.7 mmol/L — ABNORMAL HIGH (ref 3.5–5.3)
Sodium: 140 mmol/L (ref 135–146)
Total Bilirubin: 0.3 mg/dL (ref 0.2–1.2)
Total Protein: 7.5 g/dL (ref 6.1–8.1)
eGFR: 79 mL/min/{1.73_m2} (ref >=60)

## 2021-09-07 LAB — PSA: PSA: 2.09 ng/mL (ref <=4.00)

## 2021-09-08 ENCOUNTER — Other Ambulatory Visit: Payer: Self-pay | Admitting: Osteopathic Medicine

## 2021-10-04 ENCOUNTER — Other Ambulatory Visit: Payer: Self-pay

## 2021-10-04 ENCOUNTER — Ambulatory Visit (INDEPENDENT_AMBULATORY_CARE_PROVIDER_SITE_OTHER): Payer: Medicare PPO

## 2021-10-04 DIAGNOSIS — Z87891 Personal history of nicotine dependence: Secondary | ICD-10-CM

## 2021-10-04 DIAGNOSIS — Z122 Encounter for screening for malignant neoplasm of respiratory organs: Secondary | ICD-10-CM

## 2021-10-10 ENCOUNTER — Other Ambulatory Visit: Payer: Self-pay | Admitting: Acute Care

## 2021-10-10 DIAGNOSIS — Z87891 Personal history of nicotine dependence: Secondary | ICD-10-CM

## 2021-10-12 ENCOUNTER — Other Ambulatory Visit: Payer: Self-pay | Admitting: Osteopathic Medicine

## 2021-10-18 ENCOUNTER — Other Ambulatory Visit: Payer: Self-pay

## 2021-10-18 DIAGNOSIS — E785 Hyperlipidemia, unspecified: Secondary | ICD-10-CM

## 2021-12-30 ENCOUNTER — Ambulatory Visit (INDEPENDENT_AMBULATORY_CARE_PROVIDER_SITE_OTHER): Payer: Medicare PPO | Admitting: Medical-Surgical

## 2021-12-30 DIAGNOSIS — Z0000 Encounter for general adult medical examination without abnormal findings: Secondary | ICD-10-CM | POA: Diagnosis not present

## 2021-12-30 NOTE — Progress Notes (Signed)
MEDICARE ANNUAL WELLNESS VISIT  12/30/2021  Telephone Visit Disclaimer This Medicare AWV was conducted by telephone due to national recommendations for restrictions regarding the COVID-19 Pandemic (e.g. social distancing).  I verified, using two identifiers, that I am speaking with Patrick Perkins or their authorized healthcare agent. I discussed the limitations, risks, security, and privacy concerns of performing an evaluation and management service by telephone and the potential availability of an in-person appointment in the future. The patient expressed understanding and agreed to proceed.  Location of Patient: Home Location of Provider (nurse):  Provider home  Subjective:    Patrick Perkins is a 76 y.o. male patient of Samuel Bouche, NP who had a Medicare Annual Wellness Visit today via telephone. Patrick Perkins is Retired and lives with their spouse. he has 1 child. he reports that he is socially active and does interact with friends/family regularly. he is moderately physically active and enjoys playing golf and yardwork.  Patient Care Team: Samuel Bouche, NP as PCP - General (Nurse Practitioner) Orbie Hurst, MD as Referring Physician (Dermatology) Lawrence Marseilles, MD as Referring Physician (Neurology)  Advanced Directives 12/30/2021 08/19/2019 09/19/2018 08/07/2018 05/07/2018 03/10/2015  Does Patient Have a Medical Advance Directive? Yes Yes Yes Yes Yes Yes  Type of Advance Directive Living will;Healthcare Power of [place];Living will [place];Living will Living will Living will [place];Living will  Does patient want to make changes to medical advance directive? No - Patient declined No - Patient declined - No - Patient declined - -  Copy of [place] in Chart? No - copy requested No - copy requested No - copy requested - - No - copy requested    Hospital Utilization Over the Past 12 Months: # of  hospitalizations or ER visits: 0 # of surgeries: 0  Review of Systems    Patient reports that his overall health is unchanged compared to last year.  History obtained from chart review and the patient  Patient Reported Readings (BP, Pulse, CBG, Weight, etc) none  Pain Assessment Pain : 0-10 Pain Score: 2  Pain Type: Acute pain Pain Location: Toe (Comment which one) Pain Radiating Towards: Right great toe Pain Descriptors / Indicators: Aching Pain Onset: Today Pain Frequency: Intermittent Pain Relieving Factors: medication  Pain Relieving Factors: medication  Current Medications & Allergies (verified) Allergies as of 12/30/2021   No Known Allergies      Medication List        Accurate as of December 30, 2021  1:18 PM. If you have any questions, ask your nurse or doctor.          AMBULATORY NON FORMULARY MEDICATION One Touch Verio glucose monitor test strips and lancets.  Check blood sugar daily. Dx type 2 diabetes. E11.9   aspirin 81 MG tablet Take 81 mg by mouth daily. Takes it every other day.   Aspirin Buf(CaCarb-MgCarb-MgO) 81 MG Tabs Take by mouth.   enalapril 10 MG tablet Commonly known as: VASOTEC Take 1 tablet by mouth once daily   glucose blood test strip Commonly known as: Accu-Chek Aviva Test blood sugar twice daily. Dx: E11.9   lovastatin 40 MG tablet Commonly known as: MEVACOR TAKE 1/2 TABLET BY MOUTH AT BEDTIME   metFORMIN 500 MG 24 hr tablet Commonly known as: GLUCOPHAGE-XR Take 1 tablet (500 mg total) by mouth daily with breakfast.   MULTIVITAMIN PO Take by mouth.   primidone 250 MG tablet Commonly known as:  MYSOLINE Take 250 mg by mouth at bedtime.   primidone 50 MG tablet Commonly known as: MYSOLINE Take by mouth at bedtime.   propranolol 10 MG tablet Commonly known as: INDERAL Take 10 mg by mouth in the morning and at bedtime.        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 He has one child. He enjoys playing golf and yard work.   Social Determinants of Health   Financial Resource Strain: Low Risk    Difficulty of Paying Living Expenses: Not hard at all  Food Insecurity: No Food Insecurity   Worried About Charity fundraiser in the Last Year: Never true   [place] in the Last Year: Never true  Transportation Needs: No Transportation Needs   Lack of Transportation (Medical): No   Lack of Transportation (Non-Medical): No  Physical Activity: Sufficiently Active   Days of Exercise per Week: 3 days   Minutes of Exercise per Session: 60 min  Stress: No Stress Concern Present   Feeling of Stress : Not at all  Social Connections: Moderately Integrated   Frequency of Communication with Friends and Family: Twice a week   Frequency of Social Gatherings with Friends and Family: Three times a week   Attends Religious Services: Never   Active Member of Clubs or Organizations: Yes    Attends Archivist Meetings: More than 4 times per year   Marital Status: Married    Activities of Daily Living In your present state of health, do you have any difficulty performing the following activities: 12/30/2021 09/06/2021  Hearing? N N  Vision? N N  Difficulty concentrating or making decisions? N N  Walking or climbing stairs? N N  Dressing or bathing? N N  Doing errands, shopping? N N  Preparing Food and eating ? N -  Using the Toilet? N -  In the past six months, have you accidently leaked urine? N -  Do you have problems with loss of bowel control? N -  Managing your Medications? N -  Managing your Finances? N -  Housekeeping or managing your Housekeeping? N -  Some recent data might be hidden    Patient Education/ Literacy How often  do you need to have someone help you when you read instructions, pamphlets, or other written materials from your doctor or pharmacy?: 1 - Never What is the last grade level you completed in school?: Bachelor's degree  Exercise Current Exercise Habits: Home exercise routine, Type of exercise: Other - see comments (plays golf), Time (Minutes): 60, Frequency (Times/Week): 3, Weekly Exercise (Minutes/Week): 180, Exercise limited by: None identified  Diet Patient reports consuming 2 meals a day and 0 snack(s) a day Patient reports that his primary diet is: Regular Patient reports that she does have regular access to food.   Depression Screen PHQ 2/9 Scores 12/30/2021 09/06/2021 03/08/2021 09/05/2019 08/19/2019 10/29/2018 08/07/2018  PHQ - 2 Score 0 0 0 0 0 0 0  PHQ- 9 Score - - - 1 - - 0     Fall Risk Fall Risk  12/30/2021 09/06/2021 03/08/2021 08/19/2019 08/07/2018  Falls in the past year? 0 0 0 0 No  Number falls in past yr: 0 0 0 0 -  Injury with Fall? 0 0 0 0 -  Risk for fall due to : No Fall Risks - - - -  Follow up Falls evaluation completed Falls evaluation completed Falls evaluation completed Falls prevention discussed -      Objective:  Patrick Perkins seemed alert and oriented and he participated appropriately during our telephone visit.  Blood Pressure Weight BMI  BP Readings from Last 3 Encounters:  09/06/21 (!) 145/76  03/08/21 132/76  12/15/20 140/60   Wt Readings from Last 3 Encounters:  09/06/21 174 lb (78.9 kg)  03/08/21 186 lb (84.4 kg)  12/15/20 187 lb (84.8 kg)   BMI Readings from Last 1 Encounters:  09/06/21 26.46 kg/m    *Unable to obtain current vital signs, weight, and BMI due to telephone visit type  Hearing/Vision  Mariane Masters did not seem to have difficulty with hearing/understanding during the telephone conversation Reports that he has had a formal eye exam by an eye care professional within the past year Reports that he has not had a formal hearing evaluation within the past year *Unable to fully assess hearing and vision during telephone visit type  Cognitive Function: 6CIT Screen 12/30/2021 08/19/2019 08/07/2018  What Year? 0 points 0 points 0 points  What month? 0 points 0 points 0 points  What time? 0 points 0 points 0 points  Count back from 20 0 points 0 points 0 points  Months in reverse 0 points 0 points 0 points  Repeat phrase 0 points 0 points 0 points  Total Score 0 0 0   (Normal:0-7, Significant for Dysfunction: >8)  Normal Cognitive Function Screening: Yes   Immunization & Health Maintenance Record Immunization History  Administered Date(s) Administered   Fluad Quad(high Dose 65+) 09/05/2019, 09/07/2020, 09/06/2021   H1N1 12/14/2008   Influenza Split 09/04/2011, 09/03/2012   Influenza Whole 09/06/2009, 09/20/2010, 08/20/2013   Influenza, High Dose Seasonal PF 09/18/2017   Influenza,inj,Quad PF,6+ Mos 09/10/2018   Influenza-Unspecified 08/20/2014, 07/22/2015, 09/03/2016   PFIZER(Purple Top)SARS-COV-2 Vaccination 03/02/2020, 03/22/2020   Pneumococcal Conjugate-13 04/23/2015   Pneumococcal Polysaccharide-23 09/20/2010, 04/03/2017   Td 12/14/2008   Tdap  10/29/2018   Zoster Recombinat (Shingrix) 04/07/2017, 06/21/2017   Zoster, Live 02/29/2012    Health Maintenance  Topic Date Due   OPHTHALMOLOGY EXAM  01/25/2022   HEMOGLOBIN A1C  03/07/2022   FOOT EXAM  09/06/2022   TETANUS/TDAP  10/29/2028   COLONOSCOPY (Pts 45-93yrs Insurance coverage will need to be confirmed)  10/14/2029  Pneumonia Vaccine 40+ Years old  Completed   INFLUENZA VACCINE  Completed   Hepatitis C Screening  Completed   Zoster Vaccines- Shingrix  Completed   HPV VACCINES  Aged Out   COVID-19 Vaccine  Discontinued       Assessment  This is a routine wellness examination for [facility].  Health Maintenance: Due or Overdue There are no preventive care reminders to display for this patient.   Patrick Perkins does not need a referral for Community Assistance: Care Management:   no Social Work:    no Prescription Assistance:  no Nutrition/Diabetes Education:  no   Plan:  Personalized Goals  Goals Addressed               This Visit's Progress     Patient Stated (pt-stated)        12/30/2021 AWV Goal: Exercise for General Health  Patient will verbalize understanding of the benefits of increased physical activity: Exercising regularly is important. It will improve your overall fitness, flexibility, and endurance. Regular exercise also will improve your overall health. It can help you control your weight, reduce stress, and improve your bone density. Over the next year, patient will increase physical activity as tolerated with a goal of at least 150 minutes of moderate physical activity per week.  You can tell that you are exercising at a moderate intensity if your heart starts beating faster and you start breathing faster but can still hold a conversation. Moderate-intensity exercise ideas include: Walking 1 mile (1.6 km) in about 15 minutes Biking Hiking Golfing Dancing Water aerobics Patient will verbalize understanding of everyday  activities that increase physical activity by providing examples like the following: Yard work, such as: Sales promotion account executive Gardening Washing windows or floors Patient will be able to explain general safety guidelines for exercising:  Before you start a new exercise program, talk with your health care provider. Do not exercise so much that you hurt yourself, feel dizzy, or get very short of breath. Wear comfortable clothes and wear shoes with good support. Drink plenty of water while you exercise to prevent dehydration or heat stroke. Work out until your breathing and your heartbeat get faster.        Personalized Health Maintenance & Screening Recommendations  EYE exam  Lung Cancer Screening Recommended: yes (Low Dose CT Chest recommended if Age 40-80 years, 30 pack-year currently smoking OR have quit w/in past 15 years) Hepatitis C Screening recommended: no HIV Screening recommended: no  Advanced Directives: Written information was not prepared per patient's request.  Referrals & Orders No orders of the defined types were placed in this encounter.   Follow-up Plan Follow-up with Samuel Bouche, NP as planned Please have your eye exam records faxed. Medicare wellness visit in one year.  Patient will access AVS on my chart.    I have personally reviewed and noted the following in the patients chart:   Medical and social history Use of alcohol, tobacco or illicit drugs  Current medications and supplements Functional ability and status Nutritional status Physical activity Advanced directives List of other physicians Hospitalizations, surgeries, and ER visits in previous 12 months Vitals Screenings to include cognitive, depression, and falls Referrals and appointments  In addition, I have reviewed and discussed with Patrick Perkins certain preventive protocols, quality metrics, and best  practice recommendations. A written personalized care plan for preventive services as well as  general preventive health recommendations is available and can be mailed to the patient at his request.      Tinnie Gens, RN  12/30/2021

## 2021-12-30 NOTE — Patient Instructions (Addendum)
[facility] Maintenance Summary and Written Plan of Care  [name] ,  Thank you for allowing me to perform your Medicare Annual Wellness Visit and for your ongoing commitment to your health.   Health Maintenance & Immunization History Health Maintenance  Topic Date Due   OPHTHALMOLOGY EXAM  01/25/2022   HEMOGLOBIN A1C  03/07/2022   FOOT EXAM  09/06/2022   TETANUS/TDAP  10/29/2028   COLONOSCOPY (Pts 45-51yrs Insurance coverage will need to be confirmed)  10/14/2029   Pneumonia Vaccine 55+ Years old  Completed   INFLUENZA VACCINE  Completed   Hepatitis C Screening  Completed   Zoster Vaccines- Shingrix  Completed   HPV VACCINES  Aged Out   COVID-19 Vaccine  Discontinued   Immunization History  Administered Date(s) Administered   Fluad Quad(high Dose 65+) 09/05/2019, 09/07/2020, 09/06/2021   H1N1 12/14/2008   Influenza Split 09/04/2011, 09/03/2012   Influenza Whole 09/06/2009, 09/20/2010, 08/20/2013   Influenza, High Dose Seasonal PF 09/18/2017   Influenza,inj,Quad PF,6+ Mos 09/10/2018   Influenza-Unspecified 08/20/2014, 07/22/2015, 09/03/2016   PFIZER(Purple Top)SARS-COV-2 Vaccination 03/02/2020, 03/22/2020   Pneumococcal Conjugate-13 04/23/2015   Pneumococcal Polysaccharide-23 09/20/2010, 04/03/2017   Td 12/14/2008   Tdap 10/29/2018   Zoster Recombinat (Shingrix) 04/07/2017, 06/21/2017   Zoster, Live 02/29/2012    These are the patient goals that we discussed:  Goals Addressed              This Visit's Progress     Patient Stated (pt-stated)        12/30/2021 AWV Goal: Exercise for General Health  Patient will verbalize understanding of the benefits of increased physical activity: Exercising regularly is important. It will improve your overall fitness, flexibility, and endurance. Regular exercise also will improve your overall health. It can help you control your weight, reduce stress, and improve your bone  density. Over the next year, patient will increase physical activity as tolerated with a goal of at least 150 minutes of moderate physical activity per week.  You can tell that you are exercising at a moderate intensity if your heart starts beating faster and you start breathing faster but can still hold a conversation. Moderate-intensity exercise ideas include: Walking 1 mile (1.6 km) in about 15 minutes Biking Hiking Golfing Dancing Water aerobics Patient will verbalize understanding of everyday activities that increase physical activity by providing examples like the following: Yard work, such as: Sales promotion account executive Gardening Washing windows or floors Patient will be able to explain general safety guidelines for exercising:  Before you start a new exercise program, talk with your health care provider. Do not exercise so much that you hurt yourself, feel dizzy, or get very short of breath. Wear comfortable clothes and wear shoes with good support. Drink plenty of water while you exercise to prevent dehydration or heat stroke. Work out until your breathing and your heartbeat get faster.         This is a list of Health Maintenance Items that are overdue or due now: EYE exam  Orders/Referrals Placed Today: No orders of the defined types were placed in this encounter.  (Contact our referral department at [phone] if you have not spoken with someone about your referral appointment within the next 5 days)    Follow-up Plan Follow-up with Samuel Bouche, NP as planned Please have your eye exam records faxed. Medicare wellness visit in one year.  Patient will access  AVS on my chart.       Health Maintenance, Male Adopting a healthy lifestyle and getting preventive care are important in promoting health and wellness. Ask your health care provider about: The right schedule for you to have regular  tests and exams. Things you can do on your own to prevent diseases and keep yourself healthy. What should I know about diet, weight, and exercise? Eat a healthy diet  Eat a diet that includes plenty of vegetables, fruits, low-fat dairy products, and lean protein. Do not eat a lot of foods that are high in solid fats, added sugars, or sodium. Maintain a healthy weight Body mass index (BMI) is a measurement that can be used to identify possible weight problems. It estimates body fat based on height and weight. Your health care provider can help determine your BMI and help you achieve or maintain a healthy weight. Get regular exercise Get regular exercise. This is one of the most important things you can do for your health. Most adults should: Exercise for at least 150 minutes each week. The exercise should increase your heart rate and make you sweat (moderate-intensity exercise). Do strengthening exercises at least twice a week. This is in addition to the moderate-intensity exercise. Spend less time sitting. Even light physical activity can be beneficial. Watch cholesterol and blood lipids Have your blood tested for lipids and cholesterol at 75 years of age, then have this test every 5 years. You may need to have your cholesterol levels checked more often if: Your lipid or cholesterol levels are high. You are older than 75 years of age. You are at high risk for heart disease. What should I know about cancer screening? Many types of cancers can be detected early and may often be prevented. Depending on your health history and family history, you may need to have cancer screening at various ages. This may include screening for: Colorectal cancer. Prostate cancer. Skin cancer. Lung cancer. What should I know about heart disease, diabetes, and high blood pressure? Blood pressure and heart disease High blood pressure causes heart disease and increases the risk of stroke. This is more likely to  develop in people who have high blood pressure readings or are overweight. Talk with your health care provider about your target blood pressure readings. Have your blood pressure checked: Every 3-5 years if you are 66-56 years of age. Every year if you are 44 years old or older. If you are between the ages of 66 and 105 and are a current or former smoker, ask your health care provider if you should have a one-time screening for abdominal aortic aneurysm (AAA). Diabetes Have regular diabetes screenings. This checks your fasting blood sugar level. Have the screening done: Once every three years after age 4 if you are at a normal weight and have a low risk for diabetes. More often and at a younger age if you are overweight or have a high risk for diabetes. What should I know about preventing infection? Hepatitis B If you have a higher risk for hepatitis B, you should be screened for this virus. Talk with your health care provider to find out if you are at risk for hepatitis B infection. Hepatitis C Blood testing is recommended for: Everyone born from [birth year] through 1964. Anyone with known risk factors for hepatitis C. Sexually transmitted infections (STIs) You should be screened each year for STIs, including gonorrhea and chlamydia, if: You are sexually active and are younger than 75 years of  age. Dennis Bast are older than 75 years of age and your health care provider tells you that you are at risk for this type of infection. Your sexual activity has changed since you were last screened, and you are at increased risk for chlamydia or gonorrhea. Ask your health care provider if you are at risk. Ask your health care provider about whether you are at high risk for HIV. Your health care provider may recommend a prescription medicine to help prevent HIV infection. If you choose to take medicine to prevent HIV, you should first get tested for HIV. You should then be tested every 3 months for as long as you are  taking the medicine. Follow these instructions at home: Alcohol use Do not drink alcohol if your health care provider tells you not to drink. If you drink alcohol: Limit how much you have to 0-2 drinks a day. Know how much alcohol is in your drink. In the [place] one drink equals one 12 oz bottle of beer (355 mL), one 5 oz glass of wine (148 mL), or one 1 oz glass of hard liquor (44 mL). Lifestyle Do not use any products that contain nicotine or tobacco. These products include cigarettes, chewing tobacco, and vaping devices, such as e-cigarettes. If you need help quitting, ask your health care provider. Do not use street drugs. Do not share needles. Ask your health care provider for help if you need support or information about quitting drugs. General instructions Schedule regular health, dental, and eye exams. Stay current with your vaccines. Tell your health care provider if: You often feel depressed. You have ever been abused or do not feel safe at home. Summary Adopting a healthy lifestyle and getting preventive care are important in promoting health and wellness. Follow your health care provider's instructions about healthy diet, exercising, and getting tested or screened for diseases. Follow your health care provider's instructions on monitoring your cholesterol and blood pressure. This information is not intended to replace advice given to you by your health care provider. Make sure you discuss any questions you have with your health care provider. Document Revised: 03/28/2021 Document Reviewed: 03/28/2021 Elsevier Patient Education  [place].

## 2022-01-11 DIAGNOSIS — L820 Inflamed seborrheic keratosis: Secondary | ICD-10-CM | POA: Diagnosis not present

## 2022-01-11 DIAGNOSIS — D235 Other benign neoplasm of skin of trunk: Secondary | ICD-10-CM | POA: Diagnosis not present

## 2022-01-11 DIAGNOSIS — L570 Actinic keratosis: Secondary | ICD-10-CM | POA: Diagnosis not present

## 2022-01-11 DIAGNOSIS — G548 Other nerve root and plexus disorders: Secondary | ICD-10-CM | POA: Diagnosis not present

## 2022-01-11 DIAGNOSIS — L821 Other seborrheic keratosis: Secondary | ICD-10-CM | POA: Diagnosis not present

## 2022-01-19 ENCOUNTER — Ambulatory Visit (INDEPENDENT_AMBULATORY_CARE_PROVIDER_SITE_OTHER): Payer: Medicare PPO | Admitting: Sports Medicine

## 2022-01-19 ENCOUNTER — Other Ambulatory Visit: Payer: Self-pay

## 2022-01-19 DIAGNOSIS — M720 Palmar fascial fibromatosis [Dupuytren]: Secondary | ICD-10-CM | POA: Diagnosis not present

## 2022-01-19 NOTE — Progress Notes (Signed)
? ? ?  Procedures performed today:   ? ?None. ? ?Independent interpretation of notes and tests performed by another provider:  ? ?None. ? ?Brief History, Exam, Impression, and Recommendations:   ? ?Dupuytren's contracture of both hands ?This is a pleasant 76 year old male, he has noted a progressive flexion deformity of his fourth and fifth digits on the right hand, lesser so on the left. ?On exam he has palmar aponeurosis contracture, nodularity highly consistent with Dupuytren's contracture. ?He is at the point right now where he is unable to place his hand flat on a surface. ?No pain, he is still able to grip a golf club and feels that he can live with it at this juncture. ?We discussed the treatment options including collagenase injections as well as operative excision of the palmar aponeurosis. ?As he is still functional he is going to live with it for now and let me know if he would like to proceed in the future, however I am unable to do the collagenase injections or the excision and so he would need a referral to hand surgery. ? ? ?___________________________________________ ?Gwen Her. Dianah Field, M.D., ABFM., CAQSM. ?Primary Care and Sports Medicine ?[place] ? ?Adjunct Instructor of Family Medicine  ?[employer] ?

## 2022-01-19 NOTE — Assessment & Plan Note (Signed)
This is a pleasant 76 year old male, he has noted a progressive flexion deformity of his fourth and fifth digits on the right hand, lesser so on the left. ?On exam he has palmar aponeurosis contracture, nodularity highly consistent with Dupuytren's contracture. ?He is at the point right now where he is unable to place his hand flat on a surface. ?No pain, he is still able to grip a golf club and feels that he can live with it at this juncture. ?We discussed the treatment options including collagenase injections as well as operative excision of the palmar aponeurosis. ?As he is still functional he is going to live with it for now and let me know if he would like to proceed in the future, however I am unable to do the collagenase injections or the excision and so he would need a referral to hand surgery. ?

## 2022-01-21 ENCOUNTER — Other Ambulatory Visit: Payer: Self-pay | Admitting: Medical-Surgical

## 2022-01-24 DIAGNOSIS — D3132 Benign neoplasm of left choroid: Secondary | ICD-10-CM | POA: Diagnosis not present

## 2022-01-24 DIAGNOSIS — H43813 Vitreous degeneration, bilateral: Secondary | ICD-10-CM | POA: Diagnosis not present

## 2022-01-24 DIAGNOSIS — H40013 Open angle with borderline findings, low risk, bilateral: Secondary | ICD-10-CM | POA: Diagnosis not present

## 2022-01-24 DIAGNOSIS — Z961 Presence of intraocular lens: Secondary | ICD-10-CM | POA: Diagnosis not present

## 2022-01-24 DIAGNOSIS — E119 Type 2 diabetes mellitus without complications: Secondary | ICD-10-CM | POA: Diagnosis not present

## 2022-01-24 LAB — HM DIABETES EYE EXAM

## 2022-03-06 NOTE — Progress Notes (Signed)
?  HPI with pertinent ROS:  ? ?CC: DM/HTN/HLD follow up ? ?HPI: ?Pleasant 76 year old male presenting today for follow up on: ? ?Hypertension: ?Medication: Enalapril '10mg'$  daily  ?Compliant: Yes ?Side effects: No ?Checking BP at home: yes, readings 130/70 ?Low sodium diet: Yes ?Exercise: walking some  ?Concerning symptoms: None ? ?Diabetes: ?Type: 2 ?Medications: Metformin XR '500mg'$  daily ?Compliant: Yes ?Side effects: None ?Checking sugars: yes, readings 130-140 fasting ?Diabetic diet: Most of time, occasional splurges ?Complications: None ?Eye exam: UTD ?Foot exam: UTD ?Microalbumin screening: due today ?Statin: Yes, lovastatin ?ACE/ARB: Yes, enalapril ?Last A1c: 5.6% ? ?Hyperlipidemia ?Medication: Lovastatin '40mg'$  daily ?Side effects: None ?Low fat diet: Yes ? ?Benign essential tremor- managed with primidone and propranolol. Dr. Berdine Addison manages these medications. Has an appointment in June. Tremor worse when tired.  ? ?I reviewed the past medical history, family history, social history, surgical history, and allergies today and no changes were needed.  Please see the problem list section below in epic for further details. ? ? ?Physical exam:  ? ?General: Well Developed, well nourished, and in no acute distress.  ?Neuro: Alert and oriented x3.  ?HEENT: Normocephalic, atraumatic.  ?Skin: Warm and dry. ?Cardiac: Regular rate and rhythm, no murmurs rubs or gallops, no lower extremity edema.  ?Respiratory: Clear to auscultation bilaterally. Not using accessory muscles, speaking in full sentences. ? ?Impression and Recommendations:   ? ?1. Type 2 diabetes mellitus without complication, without long-term current use of insulin ([place]) ?POCT hemoglobin A1c 6.6% today.  POCT urine albumin normal.  Continue metformin XR 500 mg daily as prescribed.  Continue checking fasting blood sugars 2-3 times weekly with a goal of 130 or less.  Checking labs today. ? ?2. Hyperlipidemia, unspecified hyperlipidemia type ?Checking lipid panel.   Continue lovastatin 40 mg daily as prescribed. ? ?3. Essential hypertension ?Blood pressure slightly elevated on arrival today.  Recheck 148/82.  Home readings are stable and at goal so continue enalapril 10 mg daily.  Checking labs. ? ?4. Benign essential tremor ?Managed by Dr. Berdine Addison.  Plan to follow-up in June as scheduled. ? ?Return in about 6 months (around 09/06/2022) for DM/HTN/HLD follow up or sooner if needed. ?___________________________________________ ?Clearnce Sorrel, DNP, APRN, FNP-BC ?Primary Care and Sports Medicine ?[place] ?

## 2022-03-07 ENCOUNTER — Encounter: Payer: Self-pay | Admitting: Medical-Surgical

## 2022-03-07 ENCOUNTER — Ambulatory Visit (INDEPENDENT_AMBULATORY_CARE_PROVIDER_SITE_OTHER): Payer: Medicare PPO | Admitting: Medical-Surgical

## 2022-03-07 VITALS — BP 148/82 | HR 62 | Resp 20 | Ht 68.0 in | Wt 185.1 lb

## 2022-03-07 DIAGNOSIS — E119 Type 2 diabetes mellitus without complications: Secondary | ICD-10-CM | POA: Diagnosis not present

## 2022-03-07 DIAGNOSIS — E785 Hyperlipidemia, unspecified: Secondary | ICD-10-CM

## 2022-03-07 DIAGNOSIS — I10 Essential (primary) hypertension: Secondary | ICD-10-CM | POA: Diagnosis not present

## 2022-03-07 DIAGNOSIS — G250 Essential tremor: Secondary | ICD-10-CM | POA: Diagnosis not present

## 2022-03-07 LAB — POCT UA - MICROALBUMIN
Albumin/Creatinine Ratio, Urine, POC: <30
Creatinine, POC: 100 mg/dL
Microalbumin Ur, POC: 30 mg/L

## 2022-03-07 LAB — POCT GLYCOSYLATED HEMOGLOBIN (HGB A1C): HbA1c, POC (controlled diabetic range): 6.6 % (ref 0.0–7.0)

## 2022-03-10 DIAGNOSIS — E785 Hyperlipidemia, unspecified: Secondary | ICD-10-CM | POA: Diagnosis not present

## 2022-03-10 DIAGNOSIS — I10 Essential (primary) hypertension: Secondary | ICD-10-CM | POA: Diagnosis not present

## 2022-03-10 LAB — CBC WITH DIFFERENTIAL/PLATELET
Absolute Monocytes: 570 cells/uL (ref 200–950)
Basophils Absolute: 40 cells/uL (ref 0–200)
Basophils Relative: 0.6 %
Eosinophils Absolute: 268 cells/uL (ref 15–500)
Eosinophils Relative: 4 %
HCT: 35.7 % — ABNORMAL LOW (ref 38.5–50.0)
Hemoglobin: 12.3 g/dL — ABNORMAL LOW (ref 13.2–17.1)
Lymphs Abs: 2204 cells/uL (ref 850–3900)
MCH: 34.6 pg — ABNORMAL HIGH (ref 27.0–33.0)
MCHC: 34.5 g/dL (ref 32.0–36.0)
MCV: 100.6 fL — ABNORMAL HIGH (ref 80.0–100.0)
MPV: 11.5 fL (ref 7.5–12.5)
Monocytes Relative: 8.5 %
Neutro Abs: 3618 cells/uL (ref 1500–7800)
Neutrophils Relative %: 54 %
Platelets: 296 10*3/uL (ref 140–400)
RBC: 3.55 10*6/uL — ABNORMAL LOW (ref 4.20–5.80)
RDW: 12.7 % (ref 11.0–15.0)
Total Lymphocyte: 32.9 %
WBC: 6.7 10*3/uL (ref 3.8–10.8)

## 2022-03-10 LAB — COMPLETE METABOLIC PANEL WITH GFR
AG Ratio: 1.8 (calc) (ref 1.0–2.5)
ALT: 19 U/L (ref 9–46)
AST: 14 U/L (ref 10–35)
Albumin: 4.2 g/dL (ref 3.6–5.1)
Alkaline phosphatase (APISO): 80 U/L (ref 35–144)
BUN: 22 mg/dL (ref 7–25)
CO2: 27 mmol/L (ref 20–32)
Calcium: 9.4 mg/dL (ref 8.6–10.3)
Chloride: 102 mmol/L (ref 98–110)
Creat: 1.19 mg/dL (ref 0.70–1.28)
Globulin: 2.3 g/dL (calc) (ref 1.9–3.7)
Glucose, Bld: 152 mg/dL — ABNORMAL HIGH (ref 65–99)
Potassium: 5.3 mmol/L (ref 3.5–5.3)
Sodium: 138 mmol/L (ref 135–146)
Total Bilirubin: 0.4 mg/dL (ref 0.2–1.2)
Total Protein: 6.5 g/dL (ref 6.1–8.1)
eGFR: 64 mL/min/{1.73_m2} (ref >=60)

## 2022-03-10 LAB — LIPID PANEL
Cholesterol: 200 mg/dL — ABNORMAL HIGH (ref <200)
HDL: 70 mg/dL (ref >=40)
LDL Cholesterol (Calc): 100 mg/dL (calc) — ABNORMAL HIGH
Non-HDL Cholesterol (Calc): 130 mg/dL (calc) — ABNORMAL HIGH (ref <130)
Total CHOL/HDL Ratio: 2.9 (calc) (ref <5.0)
Triglycerides: 186 mg/dL — ABNORMAL HIGH (ref <150)

## 2022-03-25 ENCOUNTER — Other Ambulatory Visit: Payer: Self-pay | Admitting: Osteopathic Medicine

## 2022-03-29 ENCOUNTER — Other Ambulatory Visit: Payer: Self-pay

## 2022-03-29 MED ORDER — METFORMIN HCL ER 500 MG PO TB24: 500.0000 mg | ORAL_TABLET | Freq: Every day | ORAL | 3 refills | Status: DC

## 2022-04-04 ENCOUNTER — Other Ambulatory Visit: Payer: Self-pay

## 2022-04-04 DIAGNOSIS — E785 Hyperlipidemia, unspecified: Secondary | ICD-10-CM

## 2022-04-04 MED ORDER — LOVASTATIN 40 MG PO TABS: 40.0000 mg | ORAL_TABLET | Freq: Every day | ORAL | 3 refills | Status: DC

## 2022-04-04 NOTE — Telephone Encounter (Signed)
Patient left message on voicemail stating he is taking 1 tablet of Lovastatin and if he needs to stay on 1 full tablet he will need a refill.  ?

## 2022-04-28 ENCOUNTER — Emergency Department (INDEPENDENT_AMBULATORY_CARE_PROVIDER_SITE_OTHER)
Admission: RE | Admit: 2022-04-28 | Discharge: 2022-04-28 | Disposition: A | Discharge status: Home / Self Care | Payer: Medicare PPO | Source: Ambulatory Visit

## 2022-04-28 VITALS — BP 175/91 | HR 68 | Temp 98.3°F | Resp 18 | Ht 70.0 in | Wt 178.0 lb

## 2022-04-28 DIAGNOSIS — K122 Cellulitis and abscess of mouth: Secondary | ICD-10-CM | POA: Diagnosis not present

## 2022-04-28 MED ORDER — AMOXICILLIN-POT CLAVULANATE 875-125 MG PO TABS: 1.0000 | ORAL_TABLET | Freq: Two times a day (BID) | ORAL | 0 refills | Status: AC

## 2022-04-28 NOTE — ED Triage Notes (Signed)
Patient c/o sinus drainage and sore throat x 2-3 days.  Patient denies any OTC meds.  Patient is afebrile.

## 2022-04-28 NOTE — Discharge Instructions (Addendum)
Your sore throat is related to uvulitis. This can be due to bacteria or viruses. Please read the attached handout regarding your condition. You can continue cepacol lozenges, consider salt water gargles as well. Please take the antibiotic with food. Consider a daily yogurt or probiotic to prevent GI side effects. Follow up with your PCP in 10-14 days if your symptoms persist. Head to ER if you have difficulty swallowing, drooling, neck pain or spike a fever.

## 2022-04-28 NOTE — ED Provider Notes (Signed)
[facility]    CSN: [id number] Arrival date & time: 04/28/22  1115      History   Chief Complaint Chief Complaint  Patient presents with   Sore Throat    Entered by patient   Appointment    HPI Patrick Perkins is a 76 y.o. male.   Pleasant 76yo male presents today with sore throat for the past several days. He admits to rhinorrhea and a slightly decreased appetite. Pt endorses lethargy. He denies abdominal pain, N/V/D, rash, fever, headache, sinus pain, ear pain. He has taken OTC cepacol lozenges. He reports this was helping for the first few days, but woke up this morning with a severe sore throat. Endorses odynophagia, denies dysphagia, drooling or neck stiffness. Pts wife was just in the hospital for four days, he was visiting her often prior to symptom onset.     Sore Throat    Past Medical History:  Diagnosis Date   Diabetes mellitus    ED (erectile dysfunction)     Patient Active Problem List   Diagnosis Date Noted   Dupuytren's contracture of both hands 01/19/2022   Combined forms of age-related cataract of left eye 12/28/2020   Regular astigmatism, left eye 12/28/2020   Recurrent right inguinal hernia 12/16/2020   Presbyopia 12/07/2020   Regular astigmatism, right eye 12/07/2020   Combined forms of age-related cataract of right eye 12/07/2020   Neuroforaminal stenosis of cervical spine 04/26/2018   Degenerative disc disease, cervical 04/26/2018   Left hip pain 04/26/2018   Cervical radiculitis 07/09/2014   Ganglion cyst 03/23/2014   Encounter for monitoring statin therapy 02/24/2013   Benign essential tremor 08/31/2011   Hyperlipemia 08/31/2011   Type 2 diabetes mellitus ([place]) 01/07/2010   COLONIC POLYPS 01/01/2009   DIVERTICULOSIS OF COLON 01/01/2009   Former smoker 12/14/2008    Past Surgical History:  Procedure Laterality Date   HERNIA REPAIR  2005   double       Home Medications    Prior to Admission medications    Medication Sig Start Date End Date Taking? Authorizing Provider  AMBULATORY NON FORMULARY MEDICATION One Touch Verio glucose monitor test strips and lancets.  Check blood sugar daily. Dx type 2 diabetes. E11.9 03/27/16  Yes Hommel, Sean, DO  amoxicillin-clavulanate (AUGMENTIN) 875-125 MG tablet Take 1 tablet by mouth every 12 (twelve) hours for 10 days. 04/28/22 05/08/22 Yes Dawnielle Christiana L, PA  aspirin 81 MG tablet Take 81 mg by mouth daily. Takes it every other day.   Yes [provider]  enalapril (VASOTEC) 10 MG tablet Take 1 tablet by mouth once daily 09/09/21  Yes Jessup, Joy, NP  glucose blood (ACCU-CHEK AVIVA) test strip Test blood sugar twice daily. Dx: E11.9 01/22/20  Yes Emeterio Reeve, DO  lovastatin (MEVACOR) 40 MG tablet Take 1 tablet (40 mg total) by mouth at bedtime. 04/04/22  Yes Samuel Bouche, NP  metFORMIN (GLUCOPHAGE-XR) 500 MG 24 hr tablet Take 1 tablet (500 mg total) by mouth daily with breakfast. 03/29/22  Yes Samuel Bouche, NP  Multiple Vitamins-Minerals (MULTIVITAMIN PO) Take by mouth.   Yes [provider]  primidone (MYSOLINE) 250 MG tablet Take 250 mg by mouth at bedtime.   Yes [provider]  primidone (MYSOLINE) 50 MG tablet Take by mouth at bedtime.   Yes [provider]  propranolol (INDERAL) 10 MG tablet Take 10 mg by mouth in the morning and at bedtime. 08/05/20  Yes [provider]    Family  History Family History  Problem Relation Age of Onset   Cancer Father 63       colon    Social History Social History   Tobacco Use   Smoking status: Former    Types: Cigarettes    Quit date: 09/30/2009    Years since quitting: 12.5   Smokeless tobacco: Never  Vaping Use   Vaping Use: Never used  Substance Use Topics   Alcohol use: Yes    Alcohol/week: 1.0 standard drink of alcohol    Types: 1 Cans of beer per week    Comment: occasionally   Drug use: Never     Allergies   Patient has no known  allergies.   Review of Systems Review of Systems As per HPI  Physical Exam Triage Vital Signs ED Triage Vitals  Enc Vitals Group     BP 04/28/22 1124 (!) 175/91     Pulse Rate 04/28/22 1124 68     Resp 04/28/22 1124 18     Temp 04/28/22 1124 98.3 F (36.8 C)     Temp Source 04/28/22 1124 Oral     SpO2 04/28/22 1124 97 %     Weight 04/28/22 1125 178 lb (80.7 kg)     Height 04/28/22 1125 '5\' 10"'$  (1.778 m)     Head Circumference --      Peak Flow --      Pain Score 04/28/22 1125 0     Pain Loc --      Pain Edu? --      Excl. in [place]? --    No data found.  Updated Vital Signs BP (!) 175/91 (BP Location: Right Arm)   Pulse 68   Temp 98.3 F (36.8 C) (Oral)   Resp 18   Ht '5\' 10"'$  (1.778 m)   Wt 178 lb (80.7 kg)   SpO2 97%   BMI 25.54 kg/m   Visual Acuity Right Eye Distance:   Left Eye Distance:   Bilateral Distance:    Right Eye Near:   Left Eye Near:    Bilateral Near:     Physical Exam Vitals and nursing note reviewed.  Constitutional:      General: He is not in acute distress.    Appearance: He is well-developed and normal weight. He is not ill-appearing or toxic-appearing.     Comments: Essential tremor head  HENT:     Head: Normocephalic and atraumatic.     Right Ear: Tympanic membrane and ear canal normal. No drainage, swelling or tenderness. No middle ear effusion. Tympanic membrane is not erythematous.     Left Ear: Tympanic membrane and ear canal normal. No drainage, swelling or tenderness.  No middle ear effusion. Tympanic membrane is not erythematous.     Nose: No congestion.     Mouth/Throat:     Mouth: Mucous membranes are moist. No oral lesions.     Pharynx: Oropharynx is clear. Uvula midline. Posterior oropharyngeal erythema and uvula swelling present. No pharyngeal swelling or oropharyngeal exudate.     Tonsils: No tonsillar exudate or tonsillar abscesses.  Eyes:     Extraocular Movements:     Right eye: Normal extraocular motion.     Left  eye: Normal extraocular motion.     Conjunctiva/sclera: Conjunctivae normal.     Pupils: Pupils are equal, round, and reactive to light.  Neck:     Thyroid: No thyromegaly.  Cardiovascular:     Rate and Rhythm: Normal rate and regular rhythm.  Pulmonary:  Effort: Pulmonary effort is normal. No respiratory distress.     Breath sounds: Normal breath sounds. No stridor. No wheezing or rhonchi.  Musculoskeletal:     Cervical back: Normal range of motion and neck supple.  Lymphadenopathy:     Cervical: Cervical adenopathy (submandibular) present.  Skin:    General: Skin is warm.     Coloration: Skin is not pale.     Findings: No erythema or rash.  Neurological:     Mental Status: He is alert and oriented to person, place, and time.  Psychiatric:        Mood and Affect: Mood normal.        Behavior: Behavior normal.      UC Treatments / Results  Labs (all labs ordered are listed, but only abnormal results are displayed) Labs Reviewed - No data to display  EKG   Radiology No results found.  Procedures Procedures (including critical care time)  Medications Ordered in UC Medications - No data to display  Initial Impression / Assessment and Plan / UC Course  I have reviewed the triage vital signs and the nursing notes.  Pertinent labs & imaging results that were available during my care of the patient were reviewed by me and considered in my medical decision making (see chart for details).     Uvulitis - viral vs bacterial. Will cover with abx given pt's known hx of DM and worsening symptoms. ER precautions reviewed. Continue supportive measures, add Augmentin BID.  Final Clinical Impressions(s) / UC Diagnoses   Final diagnoses:  Uvulitis     Discharge Instructions      Your sore throat is related to uvulitis. This can be due to bacteria or viruses. Please read the attached handout regarding your condition. You can continue cepacol lozenges, consider salt  water gargles as well. Please take the antibiotic with food. Consider a daily yogurt or probiotic to prevent GI side effects. Follow up with your PCP in 10-14 days if your symptoms persist. Head to ER if you have difficulty swallowing, drooling, neck pain or spike a fever.    ED Prescriptions     Medication Sig Dispense Auth. Provider   amoxicillin-clavulanate (AUGMENTIN) 875-125 MG tablet Take 1 tablet by mouth every 12 (twelve) hours for 10 days. 20 tablet Vasiliki Smaldone L, [place]      PDMP not reviewed this encounter.   Chaney Malling, [place] 04/28/22 1148

## 2022-05-13 ENCOUNTER — Emergency Department (INDEPENDENT_AMBULATORY_CARE_PROVIDER_SITE_OTHER)
Admission: RE | Admit: 2022-05-13 | Discharge: 2022-05-13 | Disposition: A | Discharge status: Home / Self Care | Payer: Medicare PPO | Source: Ambulatory Visit | Attending: Family Medicine | Admitting: Family Medicine

## 2022-05-13 ENCOUNTER — Emergency Department (INDEPENDENT_AMBULATORY_CARE_PROVIDER_SITE_OTHER): Payer: Medicare PPO

## 2022-05-13 VITALS — BP 139/82 | HR 78 | Temp 99.0°F | Resp 17 | Ht 70.0 in | Wt 176.0 lb

## 2022-05-13 DIAGNOSIS — I700 Atherosclerosis of aorta: Secondary | ICD-10-CM | POA: Insufficient documentation

## 2022-05-13 DIAGNOSIS — J029 Acute pharyngitis, unspecified: Secondary | ICD-10-CM

## 2022-05-13 DIAGNOSIS — J438 Other emphysema: Secondary | ICD-10-CM | POA: Insufficient documentation

## 2022-05-13 DIAGNOSIS — R058 Other specified cough: Secondary | ICD-10-CM

## 2022-05-13 DIAGNOSIS — R059 Cough, unspecified: Secondary | ICD-10-CM | POA: Diagnosis not present

## 2022-05-13 DIAGNOSIS — R0989 Other specified symptoms and signs involving the circulatory and respiratory systems: Secondary | ICD-10-CM

## 2022-05-13 DIAGNOSIS — R911 Solitary pulmonary nodule: Secondary | ICD-10-CM | POA: Diagnosis not present

## 2022-05-13 DIAGNOSIS — J439 Emphysema, unspecified: Secondary | ICD-10-CM | POA: Insufficient documentation

## 2022-05-13 MED ORDER — PREDNISONE 20 MG PO TABS: ORAL_TABLET | ORAL | 0 refills | Status: DC

## 2022-05-13 MED ORDER — BENZONATATE 200 MG PO CAPS
200.0000 mg | ORAL_CAPSULE | Freq: Two times a day (BID) | ORAL | 0 refills | Status: DC | PRN
Start: 2022-05-13 — End: 2022-07-30

## 2022-05-13 NOTE — ED Triage Notes (Signed)
Continued cough x 3 weeks Has become worse & productive the last few days Completed amoxicillin from last visit Denies fever OTC - last night he took nyquil - helped him sleep

## 2022-05-15 DIAGNOSIS — M542 Cervicalgia: Secondary | ICD-10-CM | POA: Diagnosis not present

## 2022-05-15 DIAGNOSIS — G250 Essential tremor: Secondary | ICD-10-CM | POA: Diagnosis not present

## 2022-05-15 DIAGNOSIS — M5412 Radiculopathy, cervical region: Secondary | ICD-10-CM | POA: Diagnosis not present

## 2022-07-30 NOTE — Progress Notes (Unsigned)
   Established Patient Office Visit  Subjective   Patient ID: Patrick Perkins, male   DOB: 02-17-1946 Age: 76 y.o. MRN: [id number]   No chief complaint on file.  HPI Pleasant 76 year old male presenting today with complaints of stomach pain.    Objective:    There were no vitals filed for this visit.  Physical Exam   No results found for this or any previous visit (from the past 24 hour(s)).   {Labs (Optional):23779}  The 10-year ASCVD risk score (Arnett DK, et al., 2019) is: 48.5%   Values used to calculate the score:     Age: 89 years     Sex: Male     Is Non-Hispanic African American: No     Diabetic: Yes     Tobacco smoker: No     Systolic Blood Pressure: 035 mmHg     Is BP treated: Yes     HDL Cholesterol: 70 mg/dL     Total Cholesterol: 200 mg/dL   Assessment & Plan:   No problem-specific Assessment & Plan notes found for this encounter.   No follow-ups on file.  ___________________________________________ Clearnce Sorrel, DNP, APRN, FNP-BC Primary Care and [place]

## 2022-07-31 ENCOUNTER — Ambulatory Visit (INDEPENDENT_AMBULATORY_CARE_PROVIDER_SITE_OTHER): Payer: Medicare PPO | Admitting: Medical-Surgical

## 2022-07-31 ENCOUNTER — Encounter: Payer: Self-pay | Admitting: Medical-Surgical

## 2022-07-31 VITALS — BP 134/73 | HR 71 | Resp 20 | Ht 70.0 in | Wt 180.0 lb

## 2022-07-31 DIAGNOSIS — R109 Unspecified abdominal pain: Secondary | ICD-10-CM

## 2022-07-31 DIAGNOSIS — Z23 Encounter for immunization: Secondary | ICD-10-CM

## 2022-08-31 DIAGNOSIS — B354 Tinea corporis: Secondary | ICD-10-CM | POA: Diagnosis not present

## 2022-08-31 DIAGNOSIS — L820 Inflamed seborrheic keratosis: Secondary | ICD-10-CM | POA: Diagnosis not present

## 2022-08-31 DIAGNOSIS — B351 Tinea unguium: Secondary | ICD-10-CM | POA: Diagnosis not present

## 2022-08-31 DIAGNOSIS — B359 Dermatophytosis, unspecified: Secondary | ICD-10-CM | POA: Diagnosis not present

## 2022-08-31 DIAGNOSIS — E663 Overweight: Secondary | ICD-10-CM | POA: Diagnosis not present

## 2022-09-08 ENCOUNTER — Ambulatory Visit: Payer: Medicare PPO | Admitting: Medical-Surgical

## 2022-09-11 NOTE — Progress Notes (Signed)
Established Patient Office Visit  Subjective   Patient ID: Patrick Perkins, male   DOB: October 02, 1946 Age: 76 y.o. MRN: [id number]   Chief Complaint  Patient presents with   Diabetes   Follow-up   HPI Pleasant 76 year old male presenting today for the following:  HTN: Checking blood pressures at home with normal readings in the 130s/70s.  Taking enalapril 10 mg daily.  Does not add some salt to his foods but mainly on potatoes and eggs.  Not regularly exercising intentionally but does play golf and stay busy doing yard work.  Notes that he accidentally took an old muscle relaxer (tizanidine) during the day 1 time and he noted that his blood pressure crashed to the 70s/40s.  He was taking them at night however he has thrown this medication away now since he is worried that his blood pressure may crash in the middle of the night and he may not wake up. Denies CP, SOB, palpitations, lower extremity edema, dizziness, headaches, or vision changes.  HLD: Taking lovastatin 40 mg daily, tolerating well without side effects.  Admits to eating fatty/fast foods more often than he should.  DM: Checking his sugars 3-4 times weekly.  Notes that his readings have been between 150-160 fasting and he expects his A1c may be elevated.  Taking metformin 500 mg daily as prescribed, tolerating well without side effects.  Tremor: Managed by neurology with Dr. Berdine Addison.  Currently taking primidone 300 mg at bedtime with propranolol 10 mg twice daily.  Notes that the medications work fairly well although he does still notice the tremor at times.  Is very cognizant of doing certain things in public such as eating soup with a spoon.  The tremor mostly affects his upper extremities but some people have noticed that his head and neck are also affected at times.  Emphysema: Up-to-date on lung cancer screening CTs.  Not smoking and has no trouble with breathing.  Tolerates activities well without shortness of breath.    Objective:    Vitals:   09/12/22 1014 09/12/22 1023 09/12/22 1056  BP: (!) 165/74 (!) 151/81 135/72 Comment: Home reading this morning  Pulse: (!) 56 (!) 56   Resp: 20    Height: '5\' 10"'$  (1.778 m)    Weight: 178 lb 9.6 oz (81 kg)    SpO2: 100% 99%   BMI (Calculated): 25.63      Physical Exam Vitals reviewed.  Constitutional:      General: He is not in acute distress.    Appearance: Normal appearance. He is not ill-appearing.  HENT:     Head: Normocephalic.  Cardiovascular:     Rate and Rhythm: Normal rate.     Pulses: Normal pulses.     Heart sounds: Normal heart sounds. No murmur heard.    No friction rub. No gallop.  Pulmonary:     Effort: Pulmonary effort is normal. No respiratory distress.     Breath sounds: Normal breath sounds.  Skin:    General: Skin is warm and dry.  Neurological:     Mental Status: He is alert and oriented to person, place, and time.  Psychiatric:        Mood and Affect: Mood normal.        Behavior: Behavior normal.        Thought Content: Thought content normal.        Judgment: Judgment normal.    Results for orders placed or performed in visit on 09/12/22 (from the past  24 hour(s))  POCT HgB A1C     Status: None   Collection Time: 09/12/22 10:23 AM  Result Value Ref Range   Hemoglobin A1C     HbA1c POC (<> result, manual entry)     HbA1c, POC (prediabetic range)     HbA1c, POC (controlled diabetic range) 6.2 0.0 - 7.0 %       The 10-year ASCVD risk score (Arnett DK, et al., 2019) is: 46.7%   Values used to calculate the score:     Age: 67 years     Sex: Male     Is Non-Hispanic African American: No     Diabetic: Yes     Tobacco smoker: No     Systolic Blood Pressure: 161 mmHg     Is BP treated: Yes     HDL Cholesterol: 70 mg/dL     Total Cholesterol: 200 mg/dL   Assessment & Plan:   1. Aortic atherosclerosis ([facility]) Continue lovastatin 40 mg daily.  Recommend modifying diet to reduce intake of saturated and trans fats.   Checking lipid panel today.  2. Other emphysema ([facility]) Stable, asymptomatic.  Upcoming chest CT for lung cancer screening already scheduled.  3. Benign essential tremor Managed by neurology.  Current regimen working fairly well with primidone and propranolol.  4. Encounter for monitoring statin therapy Checking lipids and CMP today.  Continue lovastatin as above.  5. Hyperlipidemia, unspecified hyperlipidemia type See above.  6. Type 2 diabetes mellitus without complication, without long-term current use of insulin ([facility]) POCT hemoglobin A1c 6.2% today.  Despite elevated a.m. readings, his A1c has improved.  Continue metformin 500 mg daily as prescribed.  Recommend following a diabetic diet low in concentrated sweets and simple carbohydrates. - POCT HgB A1C  7. Need for influenza vaccination Flu vaccine given in office today. - Flu Vaccine QUAD High Dose(Fluad)  8. Essential hypertension Checking labs as below.  Continue enalapril 10 mg daily.  Suspect whitecoat hypertension exacerbating today's readings.  Continue monitoring blood pressure at home with a goal of 130/80 or less.  Low-sodium diet and regular intentional activity recommended. - CBC - COMPLETE METABOLIC PANEL WITH GFR - enalapril (VASOTEC) 10 MG tablet; Take 1 tablet (10 mg total) by mouth daily.  Dispense: 90 tablet; Refill: 3 - Lipid panel  Return in about 6 months (around 03/14/2023) for DM/HTN/HLD follow up.  ___________________________________________ Clearnce Sorrel, DNP, APRN, FNP-BC Primary Care and [place]

## 2022-09-12 ENCOUNTER — Ambulatory Visit (INDEPENDENT_AMBULATORY_CARE_PROVIDER_SITE_OTHER): Payer: Medicare PPO | Admitting: Medical-Surgical

## 2022-09-12 ENCOUNTER — Encounter: Payer: Self-pay | Admitting: Medical-Surgical

## 2022-09-12 VITALS — BP 135/72 | HR 56 | Resp 20 | Ht 70.0 in | Wt 178.6 lb

## 2022-09-12 DIAGNOSIS — I700 Atherosclerosis of aorta: Secondary | ICD-10-CM

## 2022-09-12 DIAGNOSIS — E119 Type 2 diabetes mellitus without complications: Secondary | ICD-10-CM | POA: Diagnosis not present

## 2022-09-12 DIAGNOSIS — E785 Hyperlipidemia, unspecified: Secondary | ICD-10-CM | POA: Diagnosis not present

## 2022-09-12 DIAGNOSIS — G250 Essential tremor: Secondary | ICD-10-CM

## 2022-09-12 DIAGNOSIS — Z5181 Encounter for therapeutic drug level monitoring: Secondary | ICD-10-CM | POA: Diagnosis not present

## 2022-09-12 DIAGNOSIS — J438 Other emphysema: Secondary | ICD-10-CM | POA: Diagnosis not present

## 2022-09-12 DIAGNOSIS — I10 Essential (primary) hypertension: Secondary | ICD-10-CM | POA: Diagnosis not present

## 2022-09-12 DIAGNOSIS — Z79899 Other long term (current) drug therapy: Secondary | ICD-10-CM | POA: Diagnosis not present

## 2022-09-12 DIAGNOSIS — Z23 Encounter for immunization: Secondary | ICD-10-CM | POA: Diagnosis not present

## 2022-09-12 LAB — POCT GLYCOSYLATED HEMOGLOBIN (HGB A1C): HbA1c, POC (controlled diabetic range): 6.2 % (ref 0.0–7.0)

## 2022-09-12 MED ORDER — ENALAPRIL MALEATE 10 MG PO TABS: 10.0000 mg | ORAL_TABLET | Freq: Every day | ORAL | 3 refills | Status: DC

## 2022-09-13 LAB — COMPLETE METABOLIC PANEL WITH GFR
AG Ratio: 1.8 (calc) (ref 1.0–2.5)
ALT: 13 U/L (ref 9–46)
AST: 15 U/L (ref 10–35)
Albumin: 4.8 g/dL (ref 3.6–5.1)
Alkaline phosphatase (APISO): 101 U/L (ref 35–144)
BUN: 11 mg/dL (ref 7–25)
CO2: 24 mmol/L (ref 20–32)
Calcium: 9.4 mg/dL (ref 8.6–10.3)
Chloride: 103 mmol/L (ref 98–110)
Creat: 0.95 mg/dL (ref 0.70–1.28)
Globulin: 2.6 g/dL (calc) (ref 1.9–3.7)
Glucose, Bld: 145 mg/dL — ABNORMAL HIGH (ref 65–99)
Potassium: 4.9 mmol/L (ref 3.5–5.3)
Sodium: 138 mmol/L (ref 135–146)
Total Bilirubin: 0.4 mg/dL (ref 0.2–1.2)
Total Protein: 7.4 g/dL (ref 6.1–8.1)
eGFR: 83 mL/min/{1.73_m2} (ref >=60)

## 2022-09-13 LAB — LIPID PANEL
Cholesterol: 210 mg/dL — ABNORMAL HIGH (ref <200)
HDL: 84 mg/dL (ref >=40)
LDL Cholesterol (Calc): 99 mg/dL (calc)
Non-HDL Cholesterol (Calc): 126 mg/dL (calc) (ref <130)
Total CHOL/HDL Ratio: 2.5 (calc) (ref <5.0)
Triglycerides: 177 mg/dL — ABNORMAL HIGH (ref <150)

## 2022-09-13 LAB — CBC
HCT: 38.2 % — ABNORMAL LOW (ref 38.5–50.0)
Hemoglobin: 13.2 g/dL (ref 13.2–17.1)
MCH: 34.1 pg — ABNORMAL HIGH (ref 27.0–33.0)
MCHC: 34.6 g/dL (ref 32.0–36.0)
MCV: 98.7 fL (ref 80.0–100.0)
MPV: 11.7 fL (ref 7.5–12.5)
Platelets: 303 10*3/uL (ref 140–400)
RBC: 3.87 10*6/uL — ABNORMAL LOW (ref 4.20–5.80)
RDW: 12.5 % (ref 11.0–15.0)
WBC: 6.4 10*3/uL (ref 3.8–10.8)

## 2022-10-04 ENCOUNTER — Ambulatory Visit: Payer: Medicare PPO

## 2022-10-20 ENCOUNTER — Encounter: Payer: Self-pay | Admitting: *Deleted

## 2023-01-05 ENCOUNTER — Ambulatory Visit (INDEPENDENT_AMBULATORY_CARE_PROVIDER_SITE_OTHER): Payer: Medicare PPO | Admitting: Medical-Surgical

## 2023-01-05 DIAGNOSIS — Z122 Encounter for screening for malignant neoplasm of respiratory organs: Secondary | ICD-10-CM

## 2023-01-05 DIAGNOSIS — Z0000 Encounter for general adult medical examination without abnormal findings: Secondary | ICD-10-CM | POA: Diagnosis not present

## 2023-01-05 NOTE — Patient Instructions (Signed)
[place] Maintenance Summary and Written Plan of Care  Patrick Perkins ,  Thank you for allowing me to perform your Medicare Annual Wellness Visit and for your ongoing commitment to your health.   Health Maintenance & Immunization History Health Maintenance  Topic Date Due   FOOT EXAM  01/06/2023 (Originally 09/06/2022)   OPHTHALMOLOGY EXAM  01/25/2023   Diabetic kidney evaluation - Urine ACR  03/08/2023   HEMOGLOBIN A1C  03/14/2023   Diabetic kidney evaluation - eGFR measurement  09/13/2023   Medicare Annual Wellness (AWV)  01/06/2024   DTaP/Tdap/Td (3 - Td or Tdap) 10/29/2028   COLONOSCOPY (Pts 45-51yr Insurance coverage will need to be confirmed)  10/14/2029   Pneumonia Vaccine 77 Years old  Completed   INFLUENZA VACCINE  Completed   Hepatitis C Screening  Completed   Zoster Vaccines- Shingrix  Completed   HPV VACCINES  Aged Out   COVID-19 Vaccine  Discontinued   Immunization History  Administered Date(s) Administered   Fluad Quad(high Dose 65+) 09/05/2019, 09/07/2020, 09/06/2021, 09/12/2022   H1N1 12/14/2008   Influenza Split 09/04/2011, 09/03/2012   Influenza Whole 09/06/2009, 09/20/2010, 08/20/2013   Influenza, High Dose Seasonal PF 09/18/2017   Influenza,inj,Quad PF,6+ Mos 09/10/2018   Influenza-Unspecified 08/20/2014, 07/22/2015, 09/03/2016   PFIZER(Purple Top)SARS-COV-2 Vaccination 03/02/2020, 03/22/2020   Pneumococcal Conjugate-13 04/23/2015   Pneumococcal Polysaccharide-23 09/20/2010, 04/03/2017   Td 12/14/2008   Tdap 10/29/2018   Zoster Recombinat (Shingrix) 04/07/2017, 06/21/2017   Zoster, Live 02/29/2012    These are the patient goals that we discussed:  Goals Addressed               This Visit's Progress     Patient Stated (pt-stated)        Patient stated that he would like to increase activity level.         This is a list of Health Maintenance Items that are overdue or due now: Foot exam     Orders/Referrals Placed Today: Orders Placed This Encounter  Procedures   Ambulatory Referral Lung Cancer Screening [facility]    Referral Priority:   Routine    Referral Type:   Consultation    Referral Reason:   Specialty Services Required    Number of Visits Requested:   1   (Contact our referral department at [phone] you have not spoken with someone about your referral appointment within the next 5 days)    Follow-up Plan Follow-up with JSamuel Bouche NP as planned Medicare wellness visit in one year.  Patient will access AVS on my chart.      Health Maintenance, Male Adopting a healthy lifestyle and getting preventive care are important in promoting health and wellness. Ask your health care provider about: The right schedule for you to have regular tests and exams. Things you can do on your own to prevent diseases and keep yourself healthy. What should I know about diet, weight, and exercise? Eat a healthy diet  Eat a diet that includes plenty of vegetables, fruits, low-fat dairy products, and lean protein. Do not eat a lot of foods that are high in solid fats, added sugars, or sodium. Maintain a healthy weight Body mass index (BMI) is a measurement that can be used to identify possible weight problems. It estimates body fat based on height and weight. Your health care provider can help determine your BMI and help you achieve or maintain a healthy weight. Get regular exercise Get regular exercise. This is one of  the most important things you can do for your health. Most adults should: Exercise for at least 150 minutes each week. The exercise should increase your heart rate and make you sweat (moderate-intensity exercise). Do strengthening exercises at least twice a week. This is in addition to the moderate-intensity exercise. Spend less time sitting. Even light physical activity can be beneficial. Watch cholesterol and blood lipids Have your blood tested for  lipids and cholesterol at 76 years of age, then have this test every 5 years. You may need to have your cholesterol levels checked more often if: Your lipid or cholesterol levels are high. You are older than 76 years of age. You are at high risk for heart disease. What should I know about cancer screening? Many types of cancers can be detected early and may often be prevented. Depending on your health history and family history, you may need to have cancer screening at various ages. This may include screening for: Colorectal cancer. Prostate cancer. Skin cancer. Lung cancer. What should I know about heart disease, diabetes, and high blood pressure? Blood pressure and heart disease High blood pressure causes heart disease and increases the risk of stroke. This is more likely to develop in people who have high blood pressure readings or are overweight. Talk with your health care provider about your target blood pressure readings. Have your blood pressure checked: Every 3-5 years if you are 38-62 years of age. Every year if you are 34 years old or older. If you are between the ages of 63 and 43 and are a current or former smoker, ask your health care provider if you should have a one-time screening for abdominal aortic aneurysm (AAA). Diabetes Have regular diabetes screenings. This checks your fasting blood sugar level. Have the screening done: Once every three years after age 24 if you are at a normal weight and have a low risk for diabetes. More often and at a younger age if you are overweight or have a high risk for diabetes. What should I know about preventing infection? Hepatitis B If you have a higher risk for hepatitis B, you should be screened for this virus. Talk with your health care provider to find out if you are at risk for hepatitis B infection. Hepatitis C Blood testing is recommended for: Everyone born from [birth year] through 1964. Anyone with known risk factors for hepatitis  C. Sexually transmitted infections (STIs) You should be screened each year for STIs, including gonorrhea and chlamydia, if: You are sexually active and are younger than 76 years of age. You are older than 76 years of age and your health care provider tells you that you are at risk for this type of infection. Your sexual activity has changed since you were last screened, and you are at increased risk for chlamydia or gonorrhea. Ask your health care provider if you are at risk. Ask your health care provider about whether you are at high risk for HIV. Your health care provider may recommend a prescription medicine to help prevent HIV infection. If you choose to take medicine to prevent HIV, you should first get tested for HIV. You should then be tested every 3 months for as long as you are taking the medicine. Follow these instructions at home: Alcohol use Do not drink alcohol if your health care provider tells you not to drink. If you drink alcohol: Limit how much you have to 0-2 drinks a day. Know how much alcohol is in your drink. In the  [place] one drink equals one 12 oz bottle of beer (355 mL), one 5 oz glass of wine (148 mL), or one 1 oz glass of hard liquor (44 mL). Lifestyle Do not use any products that contain nicotine or tobacco. These products include cigarettes, chewing tobacco, and vaping devices, such as e-cigarettes. If you need help quitting, ask your health care provider. Do not use street drugs. Do not share needles. Ask your health care provider for help if you need support or information about quitting drugs. General instructions Schedule regular health, dental, and eye exams. Stay current with your vaccines. Tell your health care provider if: You often feel depressed. You have ever been abused or do not feel safe at home. Summary Adopting a healthy lifestyle and getting preventive care are important in promoting health and wellness. Follow your health care provider's  instructions about healthy diet, exercising, and getting tested or screened for diseases. Follow your health care provider's instructions on monitoring your cholesterol and blood pressure. This information is not intended to replace advice given to you by your health care provider. Make sure you discuss any questions you have with your health care provider. Document Revised: 03/28/2021 Document Reviewed: 03/28/2021 Elsevier Patient Education  [place].

## 2023-01-05 NOTE — Progress Notes (Signed)
MEDICARE ANNUAL WELLNESS VISIT  01/05/2023  Telephone Visit Disclaimer This Medicare AWV was conducted by telephone due to national recommendations for restrictions regarding the COVID-19 Pandemic (e.g. social distancing).  I verified, using two identifiers, that I am speaking with Patrick Perkins or their authorized healthcare agent. I discussed the limitations, risks, security, and privacy concerns of performing an evaluation and management service by telephone and the potential availability of an in-person appointment in the future. The patient expressed understanding and agreed to proceed.  Location of Patient: Home Location of Provider (nurse):  In the office.  Subjective:    Patrick Perkins is a 77 y.o. male patient of Samuel Bouche, NP who had a Medicare Annual Wellness Visit today via telephone. Patrick Perkins is Retired and lives with their spouse. he has 1 child. he reports that he is socially active and does interact with friends/family regularly. he is moderately physically active and enjoys playing golf and yard work.  Patient Care Team: Samuel Bouche, NP as PCP - General (Nurse Practitioner) Orbie Hurst, MD as Referring Physician (Dermatology) Lawrence Marseilles, MD as Referring Physician (Neurology)     01/05/2023    1:09 PM 12/30/2021    1:09 PM 08/19/2019    8:05 AM 09/19/2018   11:08 AM 08/07/2018    9:03 AM 05/07/2018    8:58 AM 03/10/2015    2:31 PM  Advanced Directives  Does Patient Have a Medical Advance Directive? Yes Yes Yes Yes Yes Yes Yes  Type of Advance Directive Living will Living will;Healthcare Power of [place];Living will [place];Living will Living will Living will [place];Living will  Does patient want to make changes to medical advance directive? No - Patient declined No - Patient declined No - Patient declined  No - Patient declined    Copy of [place] in Chart?  No - copy  requested No - copy requested No - copy requested   No - copy requested    Hospital Utilization Over the Past 12 Months: # of hospitalizations or ER visits: 0 # of surgeries: 0  Review of Systems    Patient reports that his overall health is unchanged compared to last year.  History obtained from chart review and the patient  Patient Reported Readings (BP, Pulse, CBG, Weight, etc) none  Pain Assessment Pain : No/denies pain     Current Medications & Allergies (verified) Allergies as of 01/05/2023   No Known Allergies      Medication List        Accurate as of January 05, 2023  1:17 PM. If you have any questions, ask your nurse or doctor.          AMBULATORY NON FORMULARY MEDICATION One Touch Verio glucose monitor test strips and lancets.  Check blood sugar daily. Dx type 2 diabetes. E11.9   aspirin 81 MG tablet Take 81 mg by mouth daily. Takes it every other day.   enalapril 10 MG tablet Commonly known as: VASOTEC Take 1 tablet (10 mg total) by mouth daily.   glucose blood test strip Commonly known as: Accu-Chek Aviva Test blood sugar twice daily. Dx: E11.9   lovastatin 40 MG tablet Commonly known as: MEVACOR Take 1 tablet (40 mg total) by mouth at bedtime.   metFORMIN 500 MG 24 hr tablet Commonly known as: GLUCOPHAGE-XR Take 1 tablet (500 mg total) by mouth daily with breakfast.   MULTIVITAMIN PO Take by mouth.  primidone 250 MG tablet Commonly known as: MYSOLINE Take 250 mg by mouth at bedtime.   primidone 50 MG tablet Commonly known as: MYSOLINE Take by mouth at bedtime.   propranolol 10 MG tablet Commonly known as: INDERAL Take 10 mg by mouth in the morning and at bedtime.   terbinafine 250 MG tablet Commonly known as: LAMISIL Take 250 mg by mouth as directed. Once daily for the first week of the month.        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 He has one child. He enjoys playing golf and yard work.   Social Determinants of Health   Financial Resource Strain: Low Risk  (01/02/2023)   Overall Financial Resource Strain (CARDIA)    Difficulty of Paying Living Expenses: Not hard at all  Food Insecurity: No Food Insecurity (01/02/2023)   Hunger Vital Sign    Worried About Running Out of Food in the Last Year: Never true    Ran Out of Food in the Last Year: Never true  Transportation Needs: No Transportation Needs (01/02/2023)   PRAPARE - Hydrologist (Medical): No    Lack of Transportation (Non-Medical): No  Physical Activity: Insufficiently Active (01/02/2023)   Exercise Vital Sign    Days of Exercise per Week: 2 days    Minutes of Exercise per Session: 30 min  Stress: No Stress Concern Present (01/02/2023)   [place]    Feeling of Stress : Not at all  Social Connections: Moderately Integrated (01/05/2023)   Social Connection and Isolation Panel [NHANES]    Frequency of Communication with Friends and Family: Once a week    Frequency of Social Gatherings with Friends and Family: Twice a week    Attends Religious Services: Never    Marine scientist or Organizations: Yes    Attends Music therapist: More than 4 times per year    Marital Status: Married    Activities of Daily Living  01/02/2023    4:28 PM  In your present state of health, do you have any difficulty performing the following activities:  Hearing? 0  Vision? 0  Difficulty concentrating or making decisions? 0  Walking or climbing stairs? 0  Dressing or bathing? 0  Doing errands, shopping? 0  Preparing Food and eating ? N  Using the Toilet? N  In the past six months, have you accidently leaked urine? N  Do you have problems with loss of bowel control? N  Managing your Medications? N  Managing your Finances? N  Housekeeping or managing your Housekeeping? N    Patient Education/ Literacy How often do you need to have someone help you when you read instructions, pamphlets, or other written materials from your doctor or pharmacy?: 1 - Never What is the last grade level you completed in school?: Bachelor's degree  Exercise Current Exercise Habits: Home exercise routine, Type of exercise: walking, Time (Minutes): 30, Frequency (Times/Week): 2, Weekly Exercise (Minutes/Week): 60, Intensity: Moderate, Exercise limited by: None identified  Diet Patient reports consuming 2 meals a day and 1 snack(s) a day Patient reports that his primary diet is: Regular Patient reports that she does have regular access to food.   Depression Screen    01/05/2023    1:10 PM 09/12/2022   10:27 AM 07/31/2022    9:30 AM 03/07/2022   10:00 AM 12/30/2021    1:10 PM 09/06/2021    9:19  AM 03/08/2021    8:47 AM  PHQ 2/9 Scores  PHQ - 2 Score 0 0 0 0 0 0 0     Fall Risk    01/05/2023    1:09 PM 01/02/2023    4:28 PM 03/07/2022   10:00 AM 12/30/2021    1:10 PM 09/06/2021    9:19 AM  Fall Risk   Falls in the past year? 0 0 0 0 0  Number falls in past yr: 0 0 0 0 0  Injury with Fall? 0  0 0 0  Risk for fall due to : No Fall Risks  No Fall Risks No Fall Risks   Follow up Falls evaluation completed  Falls evaluation completed Falls evaluation completed Falls evaluation completed     Objective:  ABDULHAMID UPPAL seemed alert and oriented and he participated appropriately during our telephone visit.  Blood Pressure Weight BMI  BP Readings from Last 3 Encounters:  09/12/22 135/72  07/31/22 134/73  05/13/22 139/82   Wt Readings from Last 3 Encounters:  09/12/22 178 lb 9.6 oz (81 kg)  07/31/22 180 lb 0.6 oz (81.7 kg)  05/13/22 176 lb (79.8 kg)   BMI Readings from Last 1 Encounters:  09/12/22 25.63 kg/m    *Unable to obtain current vital signs, weight, and BMI due to telephone visit type  Hearing/Vision  Constant did not seem to have difficulty with hearing/understanding during the telephone conversation Reports that he has had a formal eye exam by an eye care professional within the past year Reports that he has not had a formal hearing evaluation within the past year *Unable to fully assess hearing and vision during telephone visit type  Cognitive Function:    01/05/2023    1:11 PM 12/30/2021    1:14 PM 08/19/2019    8:09 AM 08/07/2018    9:09 AM  6CIT Screen  What Year? 0 points 0 points 0 points 0 points  What month? 0 points 0 points 0 points 0 points  What time? 0 points  0 points 0 points 0 points  Count back from 20 0 points 0 points 0 points 0 points  Months in reverse 0 points 0 points 0 points 0 points  Repeat phrase 0 points 0 points 0 points 0 points  Total Score 0 points 0 points 0 points 0 points   (Normal:0-7, Significant for Dysfunction:  >8)  Normal Cognitive Function Screening: Yes   Immunization & Health Maintenance Record Immunization History  Administered Date(s) Administered   Fluad Quad(high Dose 65+) 09/05/2019, 09/07/2020, 09/06/2021, 09/12/2022   H1N1 12/14/2008   Influenza Split 09/04/2011, 09/03/2012   Influenza Whole 09/06/2009, 09/20/2010, 08/20/2013   Influenza, High Dose Seasonal PF 09/18/2017   Influenza,inj,Quad PF,6+ Mos 09/10/2018   Influenza-Unspecified 08/20/2014, 07/22/2015, 09/03/2016   PFIZER(Purple Top)SARS-COV-2 Vaccination 03/02/2020, 03/22/2020   Pneumococcal Conjugate-13 04/23/2015   Pneumococcal Polysaccharide-23 09/20/2010, 04/03/2017   Td 12/14/2008   Tdap 10/29/2018   Zoster Recombinat (Shingrix) 04/07/2017, 06/21/2017   Zoster, Live 02/29/2012    Health Maintenance  Topic Date Due   FOOT EXAM  01/06/2023 (Originally 09/06/2022)   OPHTHALMOLOGY EXAM  01/25/2023   Diabetic kidney evaluation - Urine ACR  03/08/2023   HEMOGLOBIN A1C  03/14/2023   Diabetic kidney evaluation - eGFR measurement  09/13/2023   Medicare Annual Wellness (AWV)  01/06/2024   DTaP/Tdap/Td (3 - Td or Tdap) 10/29/2028   COLONOSCOPY (Pts 45-24yr Insurance coverage will need to be confirmed)  10/14/2029   Pneumonia Vaccine 77 Years old  Completed   INFLUENZA VACCINE  Completed   Hepatitis C Screening  Completed   Zoster Vaccines- Shingrix  Completed   HPV VACCINES  Aged Out   COVID-19 Vaccine  Discontinued       Assessment  This is a routine wellness examination for [facility]  Health Maintenance: Due or Overdue There are no preventive care reminders to display for this patient.   EBronwen Bettersdoes not need a referral for Community Assistance: Care Management:   no Social Work:    no Prescription Assistance:  no Nutrition/Diabetes Education:  no   Plan:  Personalized Goals  Goals Addressed               This Visit's Progress     Patient Stated (pt-stated)        Patient  stated that he would like to increase activity level.       Personalized Health Maintenance & Screening Recommendations  Foot exam  Lung Cancer Screening Recommended: yes; referral placed (Low Dose CT Chest recommended if Age 76-80years, 30 pack-year currently smoking OR have quit w/in past 15 years) Hepatitis C Screening recommended: no HIV Screening recommended: no  Advanced Directives: Written information was not prepared per patient's request.  Referrals & Orders Orders Placed This Encounter  Procedures   Ambulatory Referral [facility]ening [facility]    Follow-up Plan Follow-up with JSamuel Bouche NP as planned Medicare wellness visit in one year.  Patient will access AVS on my chart.   I have personally reviewed and noted the following in the patient's chart:   Medical and social history Use of alcohol, tobacco or illicit drugs  Current medications and supplements Functional ability and status Nutritional status Physical activity Advanced directives List of other physicians Hospitalizations, surgeries, and ER visits in previous 12 months Vitals Screenings to include cognitive, depression, and falls Referrals and appointments  In addition, I have reviewed and discussed with EBronwen Betterscertain preventive protocols, quality metrics, and best practice recommendations.  A written personalized care plan for preventive services as well as general preventive health recommendations is available and can be mailed to the patient at his request.      Tinnie Gens, RN BSN  01/05/2023

## 2023-01-16 ENCOUNTER — Telehealth: Payer: Medicare PPO | Admitting: Physician Assistant

## 2023-01-16 DIAGNOSIS — U071 COVID-19: Secondary | ICD-10-CM | POA: Diagnosis not present

## 2023-01-16 MED ORDER — NIRMATRELVIR/RITONAVIR (PAXLOVID)TABLET: 3.0000 | ORAL_TABLET | Freq: Two times a day (BID) | ORAL | 0 refills | Status: AC

## 2023-01-16 NOTE — Patient Instructions (Signed)
Patrick Perkins, thank you for joining Mar Daring, PA-C for today's virtual visit.  While this provider is not your primary care provider (PCP), if your PCP is located in our provider database this encounter information will be shared with them immediately following your visit.   [facility] account gives you access to today's visit and all your visits, tests, and labs performed at [facility] " click here if you don't have a [facility] account or go to [email or web address]  Consent: (Patient) Patrick Perkins provided verbal consent for this virtual visit at the beginning of the encounter.  Current Medications:  Current Outpatient Medications:    nirmatrelvir/ritonavir (PAXLOVID) 20 x 150 MG & 10 x '100MG'$  TABS, Take 3 tablets by mouth 2 (two) times daily for 5 days. (Take nirmatrelvir 150 mg two tablets twice daily for 5 days and ritonavir 100 mg one tablet twice daily for 5 days) Patient GFR is 83, Disp: 30 tablet, Rfl: 0   AMBULATORY NON FORMULARY MEDICATION, One Touch Verio glucose monitor test strips and lancets.  Check blood sugar daily. Dx type 2 diabetes. E11.9, Disp: 50 Units, Rfl: 11   aspirin 81 MG tablet, Take 81 mg by mouth daily. Takes it every other day., Disp: , Rfl:    enalapril (VASOTEC) 10 MG tablet, Take 1 tablet (10 mg total) by mouth daily., Disp: 90 tablet, Rfl: 3   glucose blood (ACCU-CHEK AVIVA) test strip, Test blood sugar twice daily. Dx: E11.9, Disp: 100 each, Rfl: prn   lovastatin (MEVACOR) 40 MG tablet, Take 1 tablet (40 mg total) by mouth at bedtime., Disp: 90 tablet, Rfl: 3   metFORMIN (GLUCOPHAGE-XR) 500 MG 24 hr tablet, Take 1 tablet (500 mg total) by mouth daily with breakfast., Disp: 90 tablet, Rfl: 3   Multiple Vitamins-Minerals (MULTIVITAMIN PO), Take by mouth., Disp: , Rfl:    primidone (MYSOLINE) 250 MG tablet, Take 250 mg by mouth at bedtime., Disp: , Rfl:    primidone (MYSOLINE) 50 MG tablet, Take by mouth  at bedtime., Disp: , Rfl:    propranolol (INDERAL) 10 MG tablet, Take 10 mg by mouth in the morning and at bedtime., Disp: , Rfl:    terbinafine (LAMISIL) 250 MG tablet, Take 250 mg by mouth as directed. Once daily for the first week of the month., Disp: , Rfl:    Medications ordered in this encounter:  Meds ordered this encounter  Medications   nirmatrelvir/ritonavir (PAXLOVID) 20 x 150 MG & 10 x '100MG'$  TABS    Sig: Take 3 tablets by mouth 2 (two) times daily for 5 days. (Take nirmatrelvir 150 mg two tablets twice daily for 5 days and ritonavir 100 mg one tablet twice daily for 5 days) Patient GFR is 83    Dispense:  30 tablet    Refill:  0    Order Specific Question:   Supervising Provider    Answer:   Chase Picket [id number]     *If you need refills on other medications prior to your next appointment, please contact your pharmacy*  Follow-Up: Call back or seek an in-person evaluation if the symptoms worsen or if the condition fails to improve as anticipated.  [facility] [phone]  Care Instructions: HOLD Lovastatin and Primidone  Nirmatrelvir; Ritonavir Tablets What is this medication? NIRMATRELVIR; RITONAVIR (NIR ma TREL vir; ri TOE na veer) treats mild to moderate COVID-19. It may help people who are at high risk of developing severe illness.  It works by limiting the spread of the virus in your body. This medicine may be used for other purposes; ask your health care provider or pharmacist if you have questions. COMMON BRAND NAME(S): PAXLOVID What should I tell my care team before I take this medication? They need to know if you have any of these conditions: Any allergies Any serious illness Kidney disease Liver disease An unusual or allergic reaction to nirmatrelvir, ritonavir, other medications, foods, dyes, or preservatives Pregnant or trying to get pregnant Breast-feeding How should I use this medication? This product contains 2 different  medications that are packaged together. For the standard dose, take 2 pink tablets of nirmatrelvir with 1 white tablet of ritonavir (3 tablets total) by mouth with water twice daily. Talk to your care team if you have kidney disease. You may need a different dose. Swallow the tablets whole. You can take it with or without food. If it upsets your stomach, take it with food. Take all of this medication unless your care team tells you to stop it early. Keep taking it even if you think you are better. Talk to your care team about the use of this medication in children. While it may be prescribed for children as young as 12 years for selected conditions, precautions do apply. Overdosage: If you think you have taken too much of this medicine contact a poison control center or emergency room at once. NOTE: This medicine is only for you. Do not share this medicine with others. What if I miss a dose? If you miss a dose, take it as soon as you can unless it is more than 8 hours late. If it is more than 8 hours late, skip the missed dose. Take the next dose at the normal time. Do not take extra or 2 doses at the same time to make up for the missed dose. What may interact with this medication? Do not take this medication with any of the following medications: Alfuzosin Certain medications for anxiety or sleep like midazolam, triazolam Certain medications for cancer like apalutamide, enzalutamide Certain medications for cholesterol like lovastatin, simvastatin Certain medications for irregular heart beat like amiodarone, dronedarone, flecainide, propafenone, quinidine Certain medications for pain like meperidine, piroxicam Certain medications for psychotic disorders like clozapine, lurasidone, pimozide Certain medications for seizures like carbamazepine, phenobarbital, phenytoin Colchicine Eletriptan Eplerenone Ergot alkaloids like dihydroergotamine, ergonovine, ergotamine,  methylergonovine Finerenone Flibanserin Ivabradine Lomitapide Naloxegol Ranolazine Rifampin Sildenafil Silodosin St. John's Wort Tolvaptan Ubrogepant Voclosporin This medication may also interact with the following medications: Bedaquiline Birth control pills Bosentan Certain antibiotics like erythromycin or clarithromycin Certain medications for blood pressure like amlodipine, diltiazem, felodipine, nicardipine, nifedipine Certain medications for cancer like abemaciclib, ceritinib, dasatinib, encorafenib, ibrutinib, ivosidenib, neratinib, nilotinib, venetoclax, vinblastine, vincristine Certain medications for cholesterol like atorvastatin, rosuvastatin Certain medications for depression like bupropion, trazodone Certain medications for fungal infections like isavuconazonium, itraconazole, ketoconazole, voriconazole Certain medications for hepatitis C like elbasvir; grazoprevir, dasabuvir; ombitasvir; paritaprevir; ritonavir, glecaprevir; pibrentasvir, sofosbuvir; velpatasvir; voxilaprevir Certain medications for HIV or AIDS Certain medications for irregular heartbeat like lidocaine Certain medications that treat or prevent blood clots like rivaroxaban, warfarin Digoxin Fentanyl Medications that lower your chance of fighting infection like cyclosporine, sirolimus, tacrolimus Methadone Quetiapine Rifabutin Salmeterol Steroid medications like betamethasone, budesonide, ciclesonide, dexamethasone, fluticasone, methylprednisolone, mometasone, triamcinolone This list may not describe all possible interactions. Give your health care provider a list of all the medicines, herbs, non-prescription drugs, or dietary supplements you use. Also tell them if you smoke,  drink alcohol, or use illegal drugs. Some items may interact with your medicine. What should I watch for while using this medication? Your condition will be monitored carefully while you are receiving this medication. Visit your  care team for regular checkups. Tell your care team if your symptoms do not start to get better or if they get worse. If you have untreated HIV infection, this medication may lead to some HIV medications not working as well in the future. Estrogen and progestin hormones may not work as well while you are taking this medication. Your care team can help you find the contraceptive option that works for you. What side effects may I notice from receiving this medication? Side effects that you should report to your care team as soon as possible: Allergic reactions--skin rash, itching, hives, swelling of the face, lips, tongue, or throat Liver injury--right upper belly pain, loss of appetite, nausea, light-colored stool, dark yellow or brown urine, yellowing skin or eyes, unusual weakness or fatigue Redness, blistering, peeling, or loosening of the skin, including inside the mouth Side effects that usually do not require medical attention (report these to your care team if they continue or are bothersome): Change in taste Diarrhea General discomfort and fatigue Increase in blood pressure Muscle pain Nausea Stomach pain This list may not describe all possible side effects. Call your doctor for medical advice about side effects. You may report side effects to FDA at 1-800-FDA-1088. Where should I keep my medication? Keep out of the reach of children and pets. Store at room temperature between 20 and 25 degrees C (68 and 77 degrees F). Get rid of any unused medication after the expiration date. To get rid of medications that are no longer needed or have expired: Take the medication to a medication take-back program. Check with your pharmacy or law enforcement to find a location. If you cannot return the medication, check the label or package insert to see if the medication should be thrown out in the garbage or flushed down the toilet. If you are not sure, ask your care team. If it is safe to put it in  the trash, take the medication out of the container. Mix the medication with cat litter, dirt, coffee grounds, or other unwanted substance. Seal the mixture in a bag or container. Put it in the trash. NOTE: This sheet is a summary. It may not cover all possible information. If you have questions about this medicine, talk to your doctor, pharmacist, or health care provider.  2023 Elsevier/Gold Standard (2020-11-15 00:00:00)    Isolation Instructions: You are to isolate at home for 5 days from onset of your symptoms. If you must be around other household members who do not have symptoms, you need to make sure that both you and the family members are masking consistently with a high-quality mask.  After day 5 of isolation, if you have had no fever within 24 hours and you are feeling better, you can end isolation but need to mask for an additional 5 days.  After day 5 if you have a fever or are having significant symptoms, please isolate for full 10 days.  If you note any worsening of symptoms despite treatment, please seek an in-person evaluation ASAP. If you note any significant shortness of breath or any chest pain, please seek ER evaluation. Please do not delay care!   COVID-19: What to Do if You Are Sick If you test positive and are an older adult or someone who  is at high risk of getting very sick from COVID-19, treatment may be available. Contact a healthcare provider right away after a positive test to determine if you are eligible, even if your symptoms are mild right now. You can also visit a Test to Treat location and, if eligible, receive a prescription from a provider. Don't delay: Treatment must be started within the first few days to be effective. If you have a fever, cough, or other symptoms, you might have COVID-19. Most people have mild illness and are able to recover at home. If you are sick: Keep track of your symptoms. If you have an emergency warning sign (including trouble  breathing), call 911. Steps to help prevent the spread of COVID-19 if you are sick If you are sick with COVID-19 or think you might have COVID-19, follow the steps below to care for yourself and to help protect other people in your home and community. Stay home except to get medical care Stay home. Most people with COVID-19 have mild illness and can recover at home without medical care. Do not leave your home, except to get medical care. Do not visit public areas and do not go to places where you are unable to wear a mask. Take care of yourself. Get rest and stay hydrated. Take over-the-counter medicines, such as acetaminophen, to help you feel better. Stay in touch with your doctor. Call before you get medical care. Be sure to get care if you have trouble breathing, or have any other emergency warning signs, or if you think it is an emergency. Avoid public transportation, ride-sharing, or taxis if possible. Get tested If you have symptoms of COVID-19, get tested. While waiting for test results, stay away from others, including staying apart from those living in your household. Get tested as soon as possible after your symptoms start. Treatments may be available for people with COVID-19 who are at risk for becoming very sick. Don't delay: Treatment must be started early to be effective--some treatments must begin within 5 days of your first symptoms. Contact your healthcare provider right away if your test result is positive to determine if you are eligible. Self-tests are one of several options for testing for the virus that causes COVID-19 and may be more convenient than laboratory-based tests and point-of-care tests. Ask your healthcare provider or your local health department if you need help interpreting your test results. You can visit your state, tribal, local, and territorial health department's website to look for the latest local information on testing sites. Separate yourself from other  people As much as possible, stay in a specific room and away from other people and pets in your home. If possible, you should use a separate bathroom. If you need to be around other people or animals in or outside of the home, wear a well-fitting mask. Tell your close contacts that they may have been exposed to COVID-19. An infected person can spread COVID-19 starting 48 hours (or 2 days) before the person has any symptoms or tests positive. By letting your close contacts know they may have been exposed to COVID-19, you are helping to protect everyone. See COVID-19 and Animals if you have questions about pets. If you are diagnosed with COVID-19, someone from the health department may call you. Answer the call to slow the spread. Monitor your symptoms Symptoms of COVID-19 include fever, cough, or other symptoms. Follow care instructions from your healthcare provider and local health department. Your local health authorities may give instructions  on checking your symptoms and reporting information. When to seek emergency medical attention Look for emergency warning signs* for COVID-19. If someone is showing any of these signs, seek emergency medical care immediately: Trouble breathing Persistent pain or pressure in the chest New confusion Inability to wake or stay awake Pale, gray, or blue-colored skin, lips, or nail beds, depending on skin tone *This list is not all possible symptoms. Please call your medical provider for any other symptoms that are severe or concerning to you. Call 911 or call ahead to your local emergency facility: Notify the operator that you are seeking care for someone who has or may have COVID-19. Call ahead before visiting your doctor Call ahead. Many medical visits for routine care are being postponed or done by phone or telemedicine. If you have a medical appointment that cannot be postponed, call your doctor's office, and tell them you have or may have COVID-19. This will  help the office protect themselves and other patients. If you are sick, wear a well-fitting mask You should wear a mask if you must be around other people or animals, including pets (even at home). Wear a mask with the best fit, protection, and comfort for you. You don't need to wear the mask if you are alone. If you can't put on a mask (because of trouble breathing, for example), cover your coughs and sneezes in some other way. Try to stay at least 6 feet away from other people. This will help protect the people around you. Masks should not be placed on young children under age 22 years, anyone who has trouble breathing, or anyone who is not able to remove the mask without help. Cover your coughs and sneezes Cover your mouth and nose with a tissue when you cough or sneeze. Throw away used tissues in a lined trash can. Immediately wash your hands with soap and water for at least 20 seconds. If soap and water are not available, clean your hands with an alcohol-based hand sanitizer that contains at least 60% alcohol. Clean your hands often Wash your hands often with soap and water for at least 20 seconds. This is especially important after blowing your nose, coughing, or sneezing; going to the bathroom; and before eating or preparing food. Use hand sanitizer if soap and water are not available. Use an alcohol-based hand sanitizer with at least 60% alcohol, covering all surfaces of your hands and rubbing them together until they feel dry. Soap and water are the best option, especially if hands are visibly dirty. Avoid touching your eyes, nose, and mouth with unwashed hands. Handwashing Tips Avoid sharing personal household items Do not share dishes, drinking glasses, cups, eating utensils, towels, or bedding with other people in your home. Wash these items thoroughly after using them with soap and water or put in the dishwasher. Clean surfaces in your home regularly Clean and disinfect high-touch  surfaces (for example, doorknobs, tables, handles, light switches, and countertops) in your "sick room" and bathroom. In shared spaces, you should clean and disinfect surfaces and items after each use by the person who is ill. If you are sick and cannot clean, a caregiver or other person should only clean and disinfect the area around you (such as your bedroom and bathroom) on an as needed basis. Your caregiver/other person should wait as long as possible (at least several hours) and wear a mask before entering, cleaning, and disinfecting shared spaces that you use. Clean and disinfect areas that may have blood,  stool, or body fluids on them. Use household cleaners and disinfectants. Clean visible dirty surfaces with household cleaners containing soap or detergent. Then, use a household disinfectant. Use a product from [employer] List N: Disinfectants for Coronavirus ([id number]). Be sure to follow the instructions on the label to ensure safe and effective use of the product. Many products recommend keeping the surface wet with a disinfectant for a certain period of time (look at "contact time" on the product label). You may also need to wear personal protective equipment, such as gloves, depending on the directions on the product label. Immediately after disinfecting, wash your hands with soap and water for 20 seconds. For completed guidance on cleaning and disinfecting your home, visit Complete Disinfection Guidance. Take steps to improve ventilation at home Improve ventilation (air flow) at home to help prevent from spreading COVID-19 to other people in your household. Clear out COVID-19 virus particles in the air by opening windows, using air filters, and turning on fans in your home. Use this interactive tool to learn how to improve air flow in your home. When you can be around others after being sick with COVID-19 Deciding when you can be around others is different for different situations. Find out  when you can safely end home isolation. For any additional questions about your care, contact your healthcare provider or state or local health department. 02/08/2021 Content source: [facility] ([facility]), Division of Viral Diseases This information is not intended to replace advice given to you by your health care provider. Make sure you discuss any questions you have with your health care provider. Document Revised: 03/24/2021 Document Reviewed: 03/24/2021 Elsevier Patient Education  2022 [employer].    If you have been instructed to have an in-person evaluation today at a local Urgent Care facility, please use the link below. It will take you to a list of all of our available [facility], including address, phone number and hours of operation. Please do not delay care.  [facility]  If you or a family member do not have a primary care provider, use the link below to schedule a visit and establish care. When you choose a [facility] primary care physician or advanced practice provider, you gain a long-term partner in health. Find a Primary Care Provider  Learn more about [facility]'s in-office and virtual care options: [facility]

## 2023-01-16 NOTE — Progress Notes (Signed)
Virtual Visit Consent   Patrick Perkins, you are scheduled for a virtual visit with a [facility] provider today. Just as with appointments in the office, your consent must be obtained to participate. Your consent will be active for this visit and any virtual visit you may have with one of our providers in the next 365 days. If you have a MyChart account, a copy of this consent can be sent to you electronically.  As this is a virtual visit, video technology does not allow for your provider to perform a traditional examination. This may limit your provider's ability to fully assess your condition. If your provider identifies any concerns that need to be evaluated in person or the need to arrange testing (such as labs, EKG, etc.), we will make arrangements to do so. Although advances in technology are sophisticated, we cannot ensure that it will always work on either your end or our end. If the connection with a video visit is poor, the visit may have to be switched to a telephone visit. With either a video or telephone visit, we are not always able to ensure that we have a secure connection.  By engaging in this virtual visit, you consent to the provision of healthcare and authorize for your insurance to be billed (if applicable) for the services provided during this visit. Depending on your insurance coverage, you may receive a charge related to this service.  I need to obtain your verbal consent now. Are you willing to proceed with your visit today? Patrick Perkins has provided verbal consent on 01/16/2023 for a virtual visit (video or telephone). Patrick Daring, PA-C  Date: 01/16/2023 4:59 PM  Virtual Visit via Video Note   I, Patrick Perkins, connected with  Patrick Perkins  ([id number], Jul 28, 1946) on 01/16/23 at  5:15 PM EST by a video-enabled telemedicine application and verified that I am speaking with the correct person using two identifiers.  Location: Patient: Virtual Visit  Location Patient: Home Provider: Virtual Visit Location Provider: Home Office   I discussed the limitations of evaluation and management by telemedicine and the availability of in person appointments. The patient expressed understanding and agreed to proceed.    History of Present Illness: Patrick Perkins is a 77 y.o. who identifies as a male who was assigned male at birth, and is being seen today for Covid 35.  HPI: URI  This is a new problem. Episode onset: Tested positive for covid 19 on at home test; Symptoms started on Sunday. The problem has been gradually worsening. There has been no fever. Associated symptoms include congestion, coughing (mild), headaches, rhinorrhea, sinus pain and a sore throat (mild). Pertinent negatives include no diarrhea, ear pain, nausea, plugged ear sensation or vomiting. Treatments tried: mucinex, nyquil. The treatment provided no relief.     Problems:  Patient Active Problem List   Diagnosis Date Noted   Other emphysema ([place]) 05/13/2022   Aortic atherosclerosis ([place]) 05/13/2022   Dupuytren's contracture of both hands 01/19/2022   Combined forms of age-related cataract of left eye 12/28/2020   Regular astigmatism, left eye 12/28/2020   Recurrent right inguinal hernia 12/16/2020   Presbyopia 12/07/2020   Regular astigmatism, right eye 12/07/2020   Combined forms of age-related cataract of right eye 12/07/2020   Neuroforaminal stenosis of cervical spine 04/26/2018   Degenerative disc disease, cervical 04/26/2018   Left hip pain 04/26/2018   Cervical radiculitis 07/09/2014   Ganglion cyst 03/23/2014   Encounter for monitoring  statin therapy 02/24/2013   Benign essential tremor 08/31/2011   Hyperlipemia 08/31/2011   Type 2 diabetes mellitus ([place]) 01/07/2010   COLONIC POLYPS 01/01/2009   DIVERTICULOSIS OF COLON 01/01/2009   Former smoker 12/14/2008    Allergies: No Known Allergies Medications:  Current Outpatient Medications:    AMBULATORY NON  FORMULARY MEDICATION, One Touch Verio glucose monitor test strips and lancets.  Check blood sugar daily. Dx type 2 diabetes. E11.9, Disp: 50 Units, Rfl: 11   aspirin 81 MG tablet, Take 81 mg by mouth daily. Takes it every other day., Disp: , Rfl:    enalapril (VASOTEC) 10 MG tablet, Take 1 tablet (10 mg total) by mouth daily., Disp: 90 tablet, Rfl: 3   glucose blood (ACCU-CHEK AVIVA) test strip, Test blood sugar twice daily. Dx: E11.9, Disp: 100 each, Rfl: prn   lovastatin (MEVACOR) 40 MG tablet, Take 1 tablet (40 mg total) by mouth at bedtime., Disp: 90 tablet, Rfl: 3   metFORMIN (GLUCOPHAGE-XR) 500 MG 24 hr tablet, Take 1 tablet (500 mg total) by mouth daily with breakfast., Disp: 90 tablet, Rfl: 3   Multiple Vitamins-Minerals (MULTIVITAMIN PO), Take by mouth., Disp: , Rfl:    nirmatrelvir/ritonavir (PAXLOVID) 20 x 150 MG & 10 x '100MG'$  TABS, Take 3 tablets by mouth 2 (two) times daily for 5 days. (Take nirmatrelvir 150 mg two tablets twice daily for 5 days and ritonavir 100 mg one tablet twice daily for 5 days) Patient GFR is 83, Disp: 30 tablet, Rfl: 0   primidone (MYSOLINE) 250 MG tablet, Take 250 mg by mouth at bedtime., Disp: , Rfl:    primidone (MYSOLINE) 50 MG tablet, Take by mouth at bedtime., Disp: , Rfl:    propranolol (INDERAL) 10 MG tablet, Take 10 mg by mouth in the morning and at bedtime., Disp: , Rfl:    terbinafine (LAMISIL) 250 MG tablet, Take 250 mg by mouth as directed. Once daily for the first week of the month., Disp: , Rfl:   Observations/Objective: Patient is well-developed, well-nourished in no acute distress.  Resting comfortably at home.  Head is normocephalic, atraumatic.  No labored breathing.  Speech is clear and coherent with logical content.  Patient is alert and oriented at baseline.    Assessment and Plan: 1. COVID-19 - MyChart COVID-19 home monitoring program; Future - nirmatrelvir/ritonavir (PAXLOVID) 20 x 150 MG & 10 x '100MG'$  TABS; Take 3 tablets by mouth 2  (two) times daily for 5 days. (Take nirmatrelvir 150 mg two tablets twice daily for 5 days and ritonavir 100 mg one tablet twice daily for 5 days) Patient GFR is 83  Dispense: 30 tablet; Refill: 0  - Continue OTC symptomatic management of choice - Will send OTC vitamins and supplement information through AVS - Paxlovid prescribed; HOLD Lovastatin and Primidone - Patient enrolled in MyChart symptom monitoring - Push fluids - Rest as needed - Discussed return precautions and when to seek in-person evaluation, sent via AVS as well   Follow Up Instructions: I discussed the assessment and treatment plan with the patient. The patient was provided an opportunity to ask questions and all were answered. The patient agreed with the plan and demonstrated an understanding of the instructions.  A copy of instructions were sent to the patient via MyChart unless otherwise noted below.    The patient was advised to call back or seek an in-person evaluation if the symptoms worsen or if the condition fails to improve as anticipated.  Time:  I spent 10  minutes with the patient via telehealth technology discussing the above problems/concerns.    Patrick Daring, PA-C

## 2023-01-19 ENCOUNTER — Telehealth: Payer: Self-pay

## 2023-01-19 NOTE — Telephone Encounter (Signed)
Called and spoke with patient in regard to COVID symptoms of diarrhea. Patient states that he is feeling better tan last week. Advise patient that care instructions on diarrhea would be sent to [facility] patient verbal understanding.

## 2023-01-30 DIAGNOSIS — H40013 Open angle with borderline findings, low risk, bilateral: Secondary | ICD-10-CM | POA: Diagnosis not present

## 2023-01-30 DIAGNOSIS — H43813 Vitreous degeneration, bilateral: Secondary | ICD-10-CM | POA: Diagnosis not present

## 2023-01-30 DIAGNOSIS — E119 Type 2 diabetes mellitus without complications: Secondary | ICD-10-CM | POA: Diagnosis not present

## 2023-01-30 DIAGNOSIS — Z961 Presence of intraocular lens: Secondary | ICD-10-CM | POA: Diagnosis not present

## 2023-01-30 DIAGNOSIS — D3132 Benign neoplasm of left choroid: Secondary | ICD-10-CM | POA: Diagnosis not present

## 2023-01-30 LAB — HM DIABETES EYE EXAM

## 2023-02-06 ENCOUNTER — Encounter: Payer: Self-pay | Admitting: Medical-Surgical

## 2023-02-15 ENCOUNTER — Other Ambulatory Visit: Payer: Self-pay

## 2023-02-15 DIAGNOSIS — Z122 Encounter for screening for malignant neoplasm of respiratory organs: Secondary | ICD-10-CM

## 2023-02-15 DIAGNOSIS — Z87891 Personal history of nicotine dependence: Secondary | ICD-10-CM

## 2023-02-21 ENCOUNTER — Ambulatory Visit (INDEPENDENT_AMBULATORY_CARE_PROVIDER_SITE_OTHER): Payer: Medicare PPO

## 2023-02-21 DIAGNOSIS — I700 Atherosclerosis of aorta: Secondary | ICD-10-CM | POA: Diagnosis not present

## 2023-02-21 DIAGNOSIS — Z87891 Personal history of nicotine dependence: Secondary | ICD-10-CM

## 2023-02-21 DIAGNOSIS — Z122 Encounter for screening for malignant neoplasm of respiratory organs: Secondary | ICD-10-CM

## 2023-02-21 DIAGNOSIS — J439 Emphysema, unspecified: Secondary | ICD-10-CM | POA: Diagnosis not present

## 2023-02-23 ENCOUNTER — Encounter: Payer: Self-pay | Admitting: Medical-Surgical

## 2023-02-23 ENCOUNTER — Other Ambulatory Visit: Payer: Self-pay

## 2023-02-23 DIAGNOSIS — Z122 Encounter for screening for malignant neoplasm of respiratory organs: Secondary | ICD-10-CM

## 2023-02-23 DIAGNOSIS — Z87891 Personal history of nicotine dependence: Secondary | ICD-10-CM

## 2023-03-17 ENCOUNTER — Other Ambulatory Visit: Payer: Self-pay | Admitting: Medical-Surgical

## 2023-03-17 DIAGNOSIS — E785 Hyperlipidemia, unspecified: Secondary | ICD-10-CM

## 2023-03-20 ENCOUNTER — Ambulatory Visit: Payer: Medicare PPO | Admitting: Medical-Surgical

## 2023-03-21 ENCOUNTER — Ambulatory Visit (INDEPENDENT_AMBULATORY_CARE_PROVIDER_SITE_OTHER): Payer: Medicare PPO | Admitting: Medical-Surgical

## 2023-03-21 ENCOUNTER — Encounter: Payer: Self-pay | Admitting: Medical-Surgical

## 2023-03-21 VITALS — BP 132/73 | HR 62 | Resp 20 | Ht 70.0 in | Wt 180.5 lb

## 2023-03-21 DIAGNOSIS — J438 Other emphysema: Secondary | ICD-10-CM

## 2023-03-21 DIAGNOSIS — I700 Atherosclerosis of aorta: Secondary | ICD-10-CM

## 2023-03-21 DIAGNOSIS — E1169 Type 2 diabetes mellitus with other specified complication: Secondary | ICD-10-CM | POA: Diagnosis not present

## 2023-03-21 DIAGNOSIS — E119 Type 2 diabetes mellitus without complications: Secondary | ICD-10-CM

## 2023-03-21 DIAGNOSIS — E785 Hyperlipidemia, unspecified: Secondary | ICD-10-CM | POA: Diagnosis not present

## 2023-03-21 LAB — POCT UA - MICROALBUMIN
Albumin/Creatinine Ratio, Urine, POC: <30
Creatinine, POC: 100 mg/dL
Microalbumin Ur, POC: 30 mg/L

## 2023-03-21 LAB — POCT GLYCOSYLATED HEMOGLOBIN (HGB A1C)
HbA1c, POC (controlled diabetic range): 6.9 % (ref 0.0–7.0)
Hemoglobin A1C: 6.9 % — AB (ref 4.0–5.6)

## 2023-03-21 MED ORDER — METFORMIN HCL ER 500 MG PO TB24: 500.0000 mg | ORAL_TABLET | Freq: Every day | ORAL | 3 refills | Status: DC

## 2023-03-21 MED ORDER — LOVASTATIN 40 MG PO TABS: 40.0000 mg | ORAL_TABLET | Freq: Every day | ORAL | 3 refills | Status: DC

## 2023-03-21 MED ORDER — GLUCOSE BLOOD VI STRP: ORAL_STRIP | 99 refills | Status: DC

## 2023-03-21 NOTE — Progress Notes (Signed)
        Established patient visit  History, exam, impression, and plan:  1. Type 2 diabetes mellitus without complication, without long-term current use of insulin [facility]) Very pleasant 77 year old male presenting today for follow-up on diabetes.  He is currently taking metformin 500 mg daily, tolerating well without side effects.  Checking his sugars in the morning but notes that they have been running a little higher than desired around 145-160.  He is up-to-date on preventative care for diabetic management.  No recent change in his diet but notes that he has not been as active as he was before.  POCT hemoglobin A1c 6.9% today.  Previous A1c was at 6.2% so this has been a bit of a jump.  Recommend continuing to monitor glucose with a fasting goal of 120 or less.  Continue metformin 500 mg daily and work to identify areas in his diet or daily activities that could be causing the elevation.  If unable to determine what is causing it and sugars continue to run greater than 120 in the morning, plan to increase metformin to 500 mg twice daily.  POCT microalbumin normal. - glucose blood (ACCU-CHEK AVIVA) test strip; Test blood sugar twice daily. Dx: E11.9  Dispense: 100 each; Refill: prn - metFORMIN (GLUCOPHAGE-XR) 500 MG 24 hr tablet; Take 1 tablet (500 mg total) by mouth daily with breakfast.  Dispense: 90 tablet; Refill: 3 - POCT HgB A1C - POCT UA - Microalbumin  2. Hyperlipidemia associated with type 2 diabetes mellitus (HCC) Has been taking lovastatin 40 mg daily, tolerating well without side effects.  Low-fat heart healthy diet and stays physically active.  Most recent labs stable.  Continue lovastatin as prescribed. - lovastatin (MEVACOR) 40 MG tablet; Take 1 tablet (40 mg total) by mouth at bedtime.  Dispense: 90 tablet; Refill: 3  3. Other emphysema (HCC) Managed by pulmonology.  Former smoker.  Denies any respiratory concerns.  4. Aortic atherosclerosis (HCC) History of aortic atherosclerosis  currently treated with lovastatin and a low-fat heart healthy diet.   Procedures performed this visit: None.  Return in about 6 months (around 09/21/2023) for chronic disease follow up.  __________________________________ Thayer Ohm, DNP, APRN, FNP-BC Primary Care and Sports Medicine [facility] [place]

## 2023-03-26 DIAGNOSIS — G250 Essential tremor: Secondary | ICD-10-CM | POA: Diagnosis not present

## 2023-03-26 DIAGNOSIS — M542 Cervicalgia: Secondary | ICD-10-CM | POA: Diagnosis not present

## 2023-03-26 DIAGNOSIS — M5412 Radiculopathy, cervical region: Secondary | ICD-10-CM | POA: Diagnosis not present

## 2023-04-12 ENCOUNTER — Ambulatory Visit
Admission: RE | Admit: 2023-04-12 | Discharge: 2023-04-12 | Disposition: A | Discharge status: Home / Self Care | Payer: Medicare PPO | Source: Ambulatory Visit | Attending: Family Medicine | Admitting: Family Medicine

## 2023-04-12 ENCOUNTER — Other Ambulatory Visit: Payer: Self-pay

## 2023-04-12 VITALS — BP 177/90 | HR 59 | Temp 98.3°F | Resp 16 | Ht 70.0 in | Wt 178.0 lb

## 2023-04-12 DIAGNOSIS — R059 Cough, unspecified: Secondary | ICD-10-CM

## 2023-04-12 HISTORY — DX: Essential (primary) hypertension: I10

## 2023-04-12 MED ORDER — DOXYCYCLINE HYCLATE 100 MG PO CAPS: 100.0000 mg | ORAL_CAPSULE | Freq: Two times a day (BID) | ORAL | 0 refills | Status: AC

## 2023-04-12 MED ORDER — BENZONATATE 200 MG PO CAPS: 200.0000 mg | ORAL_CAPSULE | Freq: Three times a day (TID) | ORAL | 0 refills | Status: AC | PRN

## 2023-04-12 NOTE — ED Triage Notes (Signed)
Cough, sore throat & sinus congestion over the weekend Pt felt better on Monday - symptoms came back this am  Dayquil helped  Denies fevers

## 2023-04-12 NOTE — Discharge Instructions (Addendum)
Instructed patient to take medications as directed with food to completion.  Advised patient may use Tessalon Perles daily or as needed for cough.  Encouraged to increase daily water intake to 64 ounces per day while taking this medications.  Advised if symptoms worsen and/or unresolved please follow-up with PCP or here for further evaluation.

## 2023-04-12 NOTE — ED Provider Notes (Signed)
[facility]    CSN: [id number] Arrival date & time: 04/12/23  1752      History   Chief Complaint Chief Complaint  Patient presents with   Sore Throat    HPI Patrick Perkins is a 77 y.o. male.   HPI 77 year old male presents with cough, sore throat and sinus congestion for 5 to 6 days.  PMH significant for T2DM, HTN and pulmonary emphysema.  Past Medical History:  Diagnosis Date   Diabetes mellitus    ED (erectile dysfunction)    Hypertension     Patient Active Problem List   Diagnosis Date Noted   Pulmonary emphysema (HCC) 05/13/2022   Aortic atherosclerosis (HCC) 05/13/2022   Dupuytren's contracture of both hands 01/19/2022   Combined forms of age-related cataract of left eye 12/28/2020   Regular astigmatism, left eye 12/28/2020   Recurrent right inguinal hernia 12/16/2020   Presbyopia 12/07/2020   Regular astigmatism, right eye 12/07/2020   Combined forms of age-related cataract of right eye 12/07/2020   Neuroforaminal stenosis of cervical spine 04/26/2018   Degenerative disc disease, cervical 04/26/2018   Left hip pain 04/26/2018   Cervical radiculitis 07/09/2014   Ganglion cyst 03/23/2014   Encounter for monitoring statin therapy 02/24/2013   Benign essential tremor 08/31/2011   Hyperlipidemia associated with type 2 diabetes mellitus (HCC) 08/31/2011   Type 2 diabetes mellitus (HCC) 01/07/2010   COLONIC POLYPS 01/01/2009   DIVERTICULOSIS OF COLON 01/01/2009   Former smoker 12/14/2008    Past Surgical History:  Procedure Laterality Date   EYE SURGERY     HERNIA REPAIR  11/21/2003   double       Home Medications    Prior to Admission medications   Medication Sig Start Date End Date Taking? Authorizing Provider  benzonatate (TESSALON) 200 MG capsule Take 1 capsule (200 mg total) by mouth 3 (three) times daily as needed for up to 7 days. 04/12/23 04/19/23 Yes Trevor Iha, FNP  doxycycline (VIBRAMYCIN) 100 MG capsule Take 1 capsule  (100 mg total) by mouth 2 (two) times daily for 10 days. 04/12/23 04/22/23 Yes Trevor Iha, FNP  AMBULATORY NON FORMULARY MEDICATION One Touch Verio glucose monitor test strips and lancets.  Check blood sugar daily. Dx type 2 diabetes. E11.9 03/27/16   Laren Boom, DO  aspirin 81 MG tablet Take 81 mg by mouth daily. Takes it every other day.    [provider]  enalapril (VASOTEC) 10 MG tablet Take 1 tablet (10 mg total) by mouth daily. 09/12/22   Christen Butter, NP  glucose blood (ACCU-CHEK AVIVA) test strip Test blood sugar twice daily. Dx: E11.9 03/21/23   Christen Butter, NP  lovastatin (MEVACOR) 40 MG tablet Take 1 tablet (40 mg total) by mouth at bedtime. 03/21/23   Christen Butter, NP  metFORMIN (GLUCOPHAGE-XR) 500 MG 24 hr tablet Take 1 tablet (500 mg total) by mouth daily with breakfast. 03/21/23   Christen Butter, NP  Multiple Vitamins-Minerals (MULTIVITAMIN PO) Take by mouth.    [provider]  primidone (MYSOLINE) 250 MG tablet Take 250 mg by mouth at bedtime.    [provider]  primidone (MYSOLINE) 50 MG tablet Take by mouth at bedtime.    [provider]  propranolol (INDERAL) 10 MG tablet Take 10 mg by mouth in the morning and at bedtime. 08/05/20   [provider]  terbinafine (LAMISIL) 250 MG tablet Take 250 mg by mouth as directed. Once daily for the first week of the month.  Patient not taking: Reported on 04/12/2023 09/01/22   [provider]    Family History Family History  Problem Relation Age of Onset   Cancer Father 74       colon    Social History Social History   Tobacco Use   Smoking status: Former    Packs/day: 1.00    Years: 30.00    Additional pack years: 0.00    Total pack years: 30.00    Types: Cigarettes    Quit date: 09/30/2009    Years since quitting: 13.5   Smokeless tobacco: Never  Vaping Use   Vaping Use: Never used  Substance Use Topics   Alcohol use: Yes    Alcohol/week: 5.0 standard drinks of alcohol     Types: 1 Cans of beer, 4 Standard drinks or equivalent per week    Comment: occasionally   Drug use: Never     Allergies   Patient has no known allergies.   Review of Systems Review of Systems  HENT:  Positive for congestion and sore throat.   Respiratory:  Positive for cough.   All other systems reviewed and are negative.    Physical Exam Triage Vital Signs ED Triage Vitals  Enc Vitals Group     BP 04/12/23 1759 (!) 177/90     Pulse Rate 04/12/23 1759 (!) 59     Resp 04/12/23 1759 16     Temp 04/12/23 1759 98.3 F (36.8 C)     Temp Source 04/12/23 1759 Oral     SpO2 04/12/23 1759 98 %     Weight 04/12/23 1802 178 lb (80.7 kg)     Height 04/12/23 1802 5\' 10"  (1.778 m)     Head Circumference --      Peak Flow --      Pain Score 04/12/23 1802 0     Pain Loc --      Pain Edu? --      Excl. in GC? --    No data found.  Updated Vital Signs BP (!) 177/90 (BP Location: Left Arm) Comment: HTN hx  Pulse (!) 59   Temp 98.3 F (36.8 C) (Oral)   Resp 16   Ht 5\' 10"  (1.778 m)   Wt 178 lb (80.7 kg)   SpO2 98%   BMI 25.54 kg/m    Physical Exam Vitals and nursing note reviewed.  Constitutional:      Appearance: Normal appearance. He is well-developed and normal weight. He is not ill-appearing.  HENT:     Head: Normocephalic and atraumatic.     Right Ear: Tympanic membrane, ear canal and external ear normal.     Left Ear: Tympanic membrane, ear canal and external ear normal.     Mouth/Throat:     Mouth: Mucous membranes are moist.     Pharynx: Oropharynx is clear. Uvula midline.  Eyes:     Conjunctiva/sclera: Conjunctivae normal.     Pupils: Pupils are equal, round, and reactive to light.  Cardiovascular:     Rate and Rhythm: Normal rate and regular rhythm.     Heart sounds: Normal heart sounds.  Pulmonary:     Effort: Pulmonary effort is normal.     Breath sounds: Normal breath sounds. No wheezing, rhonchi or rales.     Comments: Infrequent nonproductive cough  noted on exam Musculoskeletal:     Cervical back: Normal range of motion and neck supple. No tenderness.  Lymphadenopathy:     Cervical: No cervical adenopathy.  Skin:    General: Skin is warm and dry.  Neurological:     General: No focal deficit present.     Mental Status: He is alert and oriented to person, place, and time. Mental status is at baseline.     Motor: No weakness.     Gait: Gait normal.  Psychiatric:        Mood and Affect: Mood normal.        Behavior: Behavior normal.        Thought Content: Thought content normal.      UC Treatments / Results  Labs (all labs ordered are listed, but only abnormal results are displayed) Labs Reviewed - No data to display  EKG   Radiology No results found.  Procedures Procedures (including critical care time)  Medications Ordered in UC Medications - No data to display  Initial Impression / Assessment and Plan / UC Course  I have reviewed the triage vital signs and the nursing notes.  Pertinent labs & imaging results that were available during my care of the patient were reviewed by me and considered in my medical decision making (see chart for details).     MDM: 1.  Cough-Rx'd Doxycycline 100 mg capsule twice daily x 10 days, Tessalon Perles 200 mg 3 times daily, as needed. Instructed patient to take medications as directed with food to completion.  Advised patient may use Tessalon Perles daily or as needed for cough.  Encouraged to increase daily water intake to 64 ounces per day while taking this medications.  Advised if symptoms worsen and/or unresolved please follow-up with PCP or here for further evaluation.  Patient discharged home, hemodynamically stable. Final Clinical Impressions(s) / UC Diagnoses   Final diagnoses:  Cough, unspecified type     Discharge Instructions      Instructed patient to take medications as directed with food to completion.  Advised patient may use Tessalon Perles daily or as needed  for cough.  Encouraged to increase daily water intake to 64 ounces per day while taking this medications.  Advised if symptoms worsen and/or unresolved please follow-up with PCP or here for further evaluation.     ED Prescriptions     Medication Sig Dispense Auth. Provider   doxycycline (VIBRAMYCIN) 100 MG capsule Take 1 capsule (100 mg total) by mouth 2 (two) times daily for 10 days. 20 capsule Trevor Iha, FNP   benzonatate (TESSALON) 200 MG capsule Take 1 capsule (200 mg total) by mouth 3 (three) times daily as needed for up to 7 days. 40 capsule Trevor Iha, FNP      PDMP not reviewed this encounter.   Trevor Iha, FNP 04/12/23 1826

## 2023-05-21 DIAGNOSIS — L570 Actinic keratosis: Secondary | ICD-10-CM | POA: Diagnosis not present

## 2023-05-21 DIAGNOSIS — D235 Other benign neoplasm of skin of trunk: Secondary | ICD-10-CM | POA: Diagnosis not present

## 2023-05-21 DIAGNOSIS — Z129 Encounter for screening for malignant neoplasm, site unspecified: Secondary | ICD-10-CM | POA: Diagnosis not present

## 2023-05-21 DIAGNOSIS — L821 Other seborrheic keratosis: Secondary | ICD-10-CM | POA: Diagnosis not present

## 2023-07-18 DIAGNOSIS — M720 Palmar fascial fibromatosis [Dupuytren]: Secondary | ICD-10-CM | POA: Diagnosis not present

## 2023-07-18 DIAGNOSIS — M79641 Pain in right hand: Secondary | ICD-10-CM | POA: Diagnosis not present

## 2023-07-18 DIAGNOSIS — M1811 Unilateral primary osteoarthritis of first carpometacarpal joint, right hand: Secondary | ICD-10-CM | POA: Diagnosis not present

## 2023-08-25 ENCOUNTER — Other Ambulatory Visit: Payer: Self-pay | Admitting: Medical-Surgical

## 2023-08-25 DIAGNOSIS — I10 Essential (primary) hypertension: Secondary | ICD-10-CM

## 2023-09-21 ENCOUNTER — Encounter: Payer: Self-pay | Admitting: Medical-Surgical

## 2023-09-21 ENCOUNTER — Ambulatory Visit (INDEPENDENT_AMBULATORY_CARE_PROVIDER_SITE_OTHER): Payer: Medicare PPO | Admitting: Medical-Surgical

## 2023-09-21 VITALS — BP 153/70 | HR 61 | Resp 20 | Ht 70.0 in | Wt 183.3 lb

## 2023-09-21 DIAGNOSIS — Z7984 Long term (current) use of oral hypoglycemic drugs: Secondary | ICD-10-CM

## 2023-09-21 DIAGNOSIS — I700 Atherosclerosis of aorta: Secondary | ICD-10-CM

## 2023-09-21 DIAGNOSIS — E1169 Type 2 diabetes mellitus with other specified complication: Secondary | ICD-10-CM

## 2023-09-21 DIAGNOSIS — I10 Essential (primary) hypertension: Secondary | ICD-10-CM | POA: Diagnosis not present

## 2023-09-21 DIAGNOSIS — E785 Hyperlipidemia, unspecified: Secondary | ICD-10-CM

## 2023-09-21 DIAGNOSIS — E119 Type 2 diabetes mellitus without complications: Secondary | ICD-10-CM

## 2023-09-21 LAB — POCT GLYCOSYLATED HEMOGLOBIN (HGB A1C)
HbA1c, POC (controlled diabetic range): 7.2 % — AB (ref 0.0–7.0)
Hemoglobin A1C: 7.2 % — AB (ref 4.0–5.6)

## 2023-09-21 MED ORDER — METFORMIN HCL ER 750 MG PO TB24
750.0000 mg | ORAL_TABLET | Freq: Every day | ORAL | 1 refills | Status: DC
Start: 2023-09-21 — End: 2023-12-28

## 2023-09-21 NOTE — Progress Notes (Signed)
        Established patient visit  History, exam, impression, and plan:  1. Type 2 diabetes mellitus without complication, without long-term current use of insulin [facility]) Very pleasant 77 year old gentleman with a history of type 2 diabetes presenting today for follow-up.  He is taking metformin 500 mg once daily, tolerating well without side effects.  Notes that his fasting sugars are little higher than he would like an average around 150.  Admits to room for improvement in his diet as he is a 77 year old man and likes to enjoy his favorite foods regularly.  POCT hemoglobin A1c 6.9% 6 months ago, recheck today at 7.2%.  Recommend working on dietary modification however in this situation, feel that he will likely continue eating his favorites.  Increase metformin to 750 mg daily.  Continue monitoring sugars with fasting goal of 90-120. - POCT HgB A1C - metFORMIN (GLUCOPHAGE-XR) 750 MG 24 hr tablet; Take 1 tablet (750 mg total) by mouth daily with breakfast.  Dispense: 90 tablet; Refill: 1  2. Hyperlipidemia associated with type 2 diabetes mellitus (HCC) Taking lovastatin 40 mg daily, tolerating well without side effects.  As noted above he has room for improvement in his diet.  He does stay physically active on a regular basis.  Plan to check labs as below.  Continue lovastatin as prescribed. - CMP14+EGFR - Lipid panel  3. Aortic atherosclerosis (HCC) Checking lipids.  See above. - Lipid panel  4. Essential hypertension History of hypertension currently managed with enalapril 10 mg daily and propranolol 10 mg twice daily.  Checking blood pressures at home and reports his readings are at or below the goal of 130/80.  Blood pressure is elevated today but he has been under a bit of stress with his friends son being very ill in the hospital.  Follows a low-sodium diet for the most part but there are some foods that require salt to be edible.  Denies any concerning symptoms today.  Checking labs as  below.  Cardiopulmonary exam is normal.  As home readings have been good, continue enalapril and propranolol as prescribed.  Unfortunately, he left before we could recheck his blood pressure.  Plan to reach out next week to see how his home readings are doing. - CBC with Differential/Platelet - CMP14+EGFR - Lipid panel   Procedures performed this visit: None.  Return in about 3 months (around 12/22/2023) for DM follow up.  __________________________________ Thayer Ohm, DNP, APRN, FNP-BC Primary Care and Sports Medicine [facility] [place]

## 2023-09-22 LAB — LIPID PANEL
Chol/HDL Ratio: 2.5 ratio (ref 0.0–5.0)
Cholesterol, Total: 194 mg/dL (ref 100–199)
HDL: 79 mg/dL (ref >39)
LDL Chol Calc (NIH): 82 mg/dL (ref 0–99)
Triglycerides: 199 mg/dL — ABNORMAL HIGH (ref 0–149)
VLDL Cholesterol Cal: 33 mg/dL (ref 5–40)

## 2023-09-22 LAB — CBC WITH DIFFERENTIAL/PLATELET
Basophils Absolute: 0 10*3/uL (ref 0.0–0.2)
Basos: 1 %
EOS (ABSOLUTE): 0.3 10*3/uL (ref 0.0–0.4)
Eos: 4 %
Hematocrit: 37.5 % (ref 37.5–51.0)
Hemoglobin: 12.7 g/dL — ABNORMAL LOW (ref 13.0–17.7)
Immature Grans (Abs): 0 10*3/uL (ref 0.0–0.1)
Immature Granulocytes: 0 %
Lymphocytes Absolute: 2.1 10*3/uL (ref 0.7–3.1)
Lymphs: 30 %
MCH: 34.5 pg — ABNORMAL HIGH (ref 26.6–33.0)
MCHC: 33.9 g/dL (ref 31.5–35.7)
MCV: 102 fL — ABNORMAL HIGH (ref 79–97)
Monocytes Absolute: 0.7 10*3/uL (ref 0.1–0.9)
Monocytes: 9 %
Neutrophils Absolute: 4 10*3/uL (ref 1.4–7.0)
Neutrophils: 56 %
Platelets: 278 10*3/uL (ref 150–450)
RBC: 3.68 x10E6/uL — ABNORMAL LOW (ref 4.14–5.80)
RDW: 13 % (ref 11.6–15.4)
WBC: 7.1 10*3/uL (ref 3.4–10.8)

## 2023-09-22 LAB — CMP14+EGFR
ALT: 25 [IU]/L (ref 0–44)
AST: 25 [IU]/L (ref 0–40)
Albumin: 4.5 g/dL (ref 3.8–4.8)
Alkaline Phosphatase: 117 [IU]/L (ref 44–121)
BUN/Creatinine Ratio: 14 (ref 10–24)
BUN: 14 mg/dL (ref 8–27)
Bilirubin Total: 0.3 mg/dL (ref 0.0–1.2)
CO2: 25 mmol/L (ref 20–29)
Calcium: 9.3 mg/dL (ref 8.6–10.2)
Chloride: 100 mmol/L (ref 96–106)
Creatinine, Ser: 0.98 mg/dL (ref 0.76–1.27)
Globulin, Total: 2.6 g/dL (ref 1.5–4.5)
Glucose: 147 mg/dL — ABNORMAL HIGH (ref 70–99)
Potassium: 5.4 mmol/L — ABNORMAL HIGH (ref 3.5–5.2)
Sodium: 138 mmol/L (ref 134–144)
Total Protein: 7.1 g/dL (ref 6.0–8.5)
eGFR: 80 mL/min/{1.73_m2} (ref >59)

## 2023-09-23 ENCOUNTER — Other Ambulatory Visit: Payer: Self-pay | Admitting: Medical-Surgical

## 2023-09-23 DIAGNOSIS — I10 Essential (primary) hypertension: Secondary | ICD-10-CM

## 2023-09-26 ENCOUNTER — Ambulatory Visit
Admission: RE | Admit: 2023-09-26 | Discharge: 2023-09-26 | Disposition: A | Discharge status: Home / Self Care | Payer: Medicare PPO | Source: Ambulatory Visit | Attending: Family Medicine | Admitting: Family Medicine

## 2023-09-26 VITALS — BP 152/85 | HR 69 | Temp 98.0°F

## 2023-09-26 DIAGNOSIS — R1032 Left lower quadrant pain: Secondary | ICD-10-CM

## 2023-09-26 MED ORDER — CIPROFLOXACIN HCL 500 MG PO TABS: 500.0000 mg | ORAL_TABLET | Freq: Two times a day (BID) | ORAL | 0 refills | Status: AC

## 2023-09-26 MED ORDER — METRONIDAZOLE 500 MG PO TABS: 500.0000 mg | ORAL_TABLET | Freq: Three times a day (TID) | ORAL | 0 refills | Status: AC

## 2023-09-26 NOTE — Discharge Instructions (Addendum)
Advised patient to take medications as directed with food to completion.  Encouraged to increase daily water intake to 64 ounces per day while taking these medications.  Advised if symptoms worsen and/or unresolved please follow-up with PCP or here for CT of abdomen and pelvis with IV and oral contrast for further evaluation.

## 2023-09-26 NOTE — ED Provider Notes (Signed)
[facility]    CSN: [id number] Arrival date & time: 09/26/23  1441      History   Chief Complaint Chief Complaint  Patient presents with   Abdominal Pain    Entered by patient    HPI Patrick Perkins is a 77 y.o. male.   HPI Very pleasant 77 year old male presents with abdominal pain only in left lower quadrant.  Reports started 2 days ago.  Patient has history of diverticulitis and reports this feels exactly like previous bouts.  Reports cramping with several episodes of diarrhea.  PMH significant for obesity, HTN, and pulmonary emphysema.  Past Medical History:  Diagnosis Date   Diabetes mellitus    ED (erectile dysfunction)    Hypertension     Patient Active Problem List   Diagnosis Date Noted   Pulmonary emphysema (HCC) 05/13/2022   Aortic atherosclerosis (HCC) 05/13/2022   Dupuytren's contracture of both hands 01/19/2022   Combined forms of age-related cataract of left eye 12/28/2020   Regular astigmatism, left eye 12/28/2020   Recurrent right inguinal hernia 12/16/2020   Presbyopia 12/07/2020   Regular astigmatism, right eye 12/07/2020   Combined forms of age-related cataract of right eye 12/07/2020   Neuroforaminal stenosis of cervical spine 04/26/2018   Degenerative disc disease, cervical 04/26/2018   Left hip pain 04/26/2018   Cervical radiculitis 07/09/2014   Ganglion cyst 03/23/2014   Benign essential tremor 08/31/2011   Hyperlipidemia associated with type 2 diabetes mellitus (HCC) 08/31/2011   Type 2 diabetes mellitus (HCC) 01/07/2010   COLONIC POLYPS 01/01/2009   Diverticulosis of colon 01/01/2009   Former smoker 12/14/2008    Past Surgical History:  Procedure Laterality Date   EYE SURGERY     HERNIA REPAIR  11/21/2003   double       Home Medications    Prior to Admission medications   Medication Sig Start Date End Date Taking? Authorizing Provider  ciprofloxacin (CIPRO) 500 MG tablet Take 1 tablet (500 mg total) by mouth  every 12 (twelve) hours for 10 days. 09/26/23 10/06/23 Yes Trevor Iha, FNP  metroNIDAZOLE (FLAGYL) 500 MG tablet Take 1 tablet (500 mg total) by mouth 3 (three) times daily for 10 days. 09/26/23 10/06/23 Yes Trevor Iha, FNP  AMBULATORY NON FORMULARY MEDICATION One Touch Verio glucose monitor test strips and lancets.  Check blood sugar daily. Dx type 2 diabetes. E11.9 03/27/16   Laren Boom, DO  aspirin 81 MG tablet Take 81 mg by mouth daily. Takes it every other day.    [provider]  enalapril (VASOTEC) 10 MG tablet Take 1 tablet by mouth once daily 09/25/23   Christen Butter, NP  glucose blood (ACCU-CHEK AVIVA) test strip Test blood sugar twice daily. Dx: E11.9 03/21/23   Christen Butter, NP  lovastatin (MEVACOR) 40 MG tablet Take 1 tablet (40 mg total) by mouth at bedtime. 03/21/23   Christen Butter, NP  metFORMIN (GLUCOPHAGE-XR) 750 MG 24 hr tablet Take 1 tablet (750 mg total) by mouth daily with breakfast. 09/21/23   Christen Butter, NP  Multiple Vitamins-Minerals (MULTIVITAMIN PO) Take by mouth.    [provider]  primidone (MYSOLINE) 250 MG tablet Take 250 mg by mouth at bedtime.    [provider]  primidone (MYSOLINE) 50 MG tablet Take by mouth at bedtime.    [provider]  propranolol (INDERAL) 10 MG tablet Take 10 mg by mouth in the morning and at bedtime. 08/05/20   [provider]    Family  History Family History  Problem Relation Age of Onset   Cancer Father 21       colon    Social History Social History   Tobacco Use   Smoking status: Former    Current packs/day: 0.00    Average packs/day: 1 pack/day for 30.0 years (30.0 ttl pk-yrs)    Types: Cigarettes    Start date: 10/01/1979    Quit date: 09/30/2009    Years since quitting: 13.9   Smokeless tobacco: Never  Vaping Use   Vaping status: Never Used  Substance Use Topics   Alcohol use: Yes    Alcohol/week: 5.0 standard drinks of alcohol    Types: 1 Cans of beer, 4 Standard drinks or  equivalent per week    Comment: occasionally   Drug use: Never     Allergies   Patient has no known allergies.   Review of Systems Review of Systems  Gastrointestinal:  Positive for abdominal pain and diarrhea.     Physical Exam Triage Vital Signs ED Triage Vitals [09/26/23 1500]  Encounter Vitals Group     BP (!) 152/85     Systolic BP Percentile      Diastolic BP Percentile      Pulse Rate 69     Resp      Temp 98 F (36.7 C)     Temp Source Oral     SpO2 98 %     Weight      Height      Head Circumference      Peak Flow      Pain Score 5     Pain Loc      Pain Education      Exclude from Growth Chart    No data found.  Updated Vital Signs BP (!) 152/85 (BP Location: Right Arm)   Pulse 69   Temp 98 F (36.7 C) (Oral)   SpO2 98%    Physical Exam Vitals and nursing note reviewed.  Constitutional:      Appearance: He is well-developed. He is obese.  HENT:     Head: Normocephalic and atraumatic.  Eyes:     Extraocular Movements: Extraocular movements intact.     Pupils: Pupils are equal, round, and reactive to light.  Cardiovascular:     Rate and Rhythm: Normal rate and regular rhythm.     Heart sounds: Normal heart sounds.  Pulmonary:     Effort: Pulmonary effort is normal.     Breath sounds: Normal breath sounds. No wheezing, rhonchi or rales.  Abdominal:     General: Abdomen is flat and scaphoid.  Skin:    General: Skin is warm and dry.  Neurological:     General: No focal deficit present.     Mental Status: He is alert and oriented to person, place, and time.  Psychiatric:        Mood and Affect: Mood normal.        Behavior: Behavior normal.      UC Treatments / Results  Labs (all labs ordered are listed, but only abnormal results are displayed) Labs Reviewed - No data to display  EKG   Radiology No results found.  Procedures Procedures (including critical care time)  Medications Ordered in UC Medications - No data to  display  Initial Impression / Assessment and Plan / UC Course  I have reviewed the triage vital signs and the nursing notes.  Pertinent labs & imaging results that were available during  my care of the patient were reviewed by me and considered in my medical decision making (see chart for details).     Abdominal pain, left lower quadrant-patient confident this is a diverticulitis flareup given his symptoms.  Reports having diverticulitis twice.  Rx'd Cipro 500 mg tablet: Take 1 tablet by mouth every 12 hours for 10 days, Rx'd Flagyl 500 mg tablet: Take 1 tablet by mouth 3 times daily for the next 10 days. Advised patient to take medications as directed with food to completion.  Encouraged to increase daily water intake to 64 ounces per day while taking these medications.  Advised if symptoms worsen and/or unresolved please follow-up with PCP or here for CT of abdomen and pelvis with IV and oral contrast for further evaluation.  Patient discharged home, hemodynamically stable. Final Clinical Impressions(s) / UC Diagnoses   Final diagnoses:  Abdominal pain, left lower quadrant     Discharge Instructions      Advised patient to take medications as directed with food to completion.  Encouraged to increase daily water intake to 64 ounces per day while taking these medications.  Advised if symptoms worsen and/or unresolved please follow-up with PCP or here for CT of abdomen and pelvis with IV and oral contrast for further evaluation.     ED Prescriptions     Medication Sig Dispense Auth. Provider   ciprofloxacin (CIPRO) 500 MG tablet Take 1 tablet (500 mg total) by mouth every 12 (twelve) hours for 10 days. 20 tablet Trevor Iha, FNP   metroNIDAZOLE (FLAGYL) 500 MG tablet Take 1 tablet (500 mg total) by mouth 3 (three) times daily for 10 days. 30 tablet Trevor Iha, FNP      PDMP not reviewed this encounter.   Trevor Iha, FNP 09/26/23 1546

## 2023-09-26 NOTE — ED Triage Notes (Signed)
Pt c/o abdominal pain only on LLQ, started about 2 days ago. Describes it at a cramping. States he had diverticulitis in past and this feels similar. He states a few episodes of diarrhea but denies urinary changes.

## 2023-10-22 ENCOUNTER — Other Ambulatory Visit: Payer: Self-pay | Admitting: Medical-Surgical

## 2023-10-22 DIAGNOSIS — I10 Essential (primary) hypertension: Secondary | ICD-10-CM

## 2023-10-31 DIAGNOSIS — M720 Palmar fascial fibromatosis [Dupuytren]: Secondary | ICD-10-CM | POA: Diagnosis not present

## 2023-10-31 DIAGNOSIS — G5601 Carpal tunnel syndrome, right upper limb: Secondary | ICD-10-CM | POA: Diagnosis not present

## 2023-11-06 ENCOUNTER — Telehealth: Payer: Self-pay

## 2023-11-06 DIAGNOSIS — E785 Hyperlipidemia, unspecified: Secondary | ICD-10-CM

## 2023-11-06 MED ORDER — LOVASTATIN 40 MG PO TABS: 80.0000 mg | ORAL_TABLET | Freq: Every day | ORAL | 3 refills | Status: DC

## 2023-11-06 NOTE — Telephone Encounter (Signed)
Copied from CRM [id number]. Topic: Clinical - Prescription Issue >> Nov 05, 2023  1:41 PM Dennison Nancy wrote: Reason for CRM: Patrick Perkins  normally double patient prescription lovastatin (MEVACOR) 40 MG tablet , it is not time to refill the medication and patient is out the medcation due to Joy doubling the medication  Patient request Patrick Perkins or a nurse call him back at  # [phone]

## 2023-11-06 NOTE — Telephone Encounter (Signed)
Lovastatin doubled on lab work results.

## 2023-11-06 NOTE — Telephone Encounter (Signed)
Patient informed. 

## 2023-12-20 ENCOUNTER — Other Ambulatory Visit: Payer: Self-pay | Admitting: Medical-Surgical

## 2023-12-20 DIAGNOSIS — I10 Essential (primary) hypertension: Secondary | ICD-10-CM

## 2023-12-28 ENCOUNTER — Ambulatory Visit (INDEPENDENT_AMBULATORY_CARE_PROVIDER_SITE_OTHER): Payer: Medicare PPO | Admitting: Medical-Surgical

## 2023-12-28 ENCOUNTER — Encounter: Payer: Self-pay | Admitting: Medical-Surgical

## 2023-12-28 VITALS — BP 149/81 | HR 65 | Resp 20 | Ht 70.0 in | Wt 180.9 lb

## 2023-12-28 DIAGNOSIS — I10 Essential (primary) hypertension: Secondary | ICD-10-CM

## 2023-12-28 DIAGNOSIS — E785 Hyperlipidemia, unspecified: Secondary | ICD-10-CM

## 2023-12-28 DIAGNOSIS — G250 Essential tremor: Secondary | ICD-10-CM | POA: Diagnosis not present

## 2023-12-28 DIAGNOSIS — E1169 Type 2 diabetes mellitus with other specified complication: Secondary | ICD-10-CM

## 2023-12-28 DIAGNOSIS — Z7984 Long term (current) use of oral hypoglycemic drugs: Secondary | ICD-10-CM | POA: Diagnosis not present

## 2023-12-28 DIAGNOSIS — E119 Type 2 diabetes mellitus without complications: Secondary | ICD-10-CM

## 2023-12-28 LAB — POCT GLYCOSYLATED HEMOGLOBIN (HGB A1C)
HbA1c, POC (controlled diabetic range): 6.3 % (ref 0.0–7.0)
Hemoglobin A1C: 6.3 % — AB (ref 4.0–5.6)

## 2023-12-28 MED ORDER — METFORMIN HCL ER 750 MG PO TB24: 750.0000 mg | ORAL_TABLET | Freq: Every day | ORAL | 3 refills | Status: DC

## 2023-12-28 MED ORDER — ENALAPRIL MALEATE 10 MG PO TABS: 10.0000 mg | ORAL_TABLET | Freq: Every day | ORAL | 3 refills | Status: DC

## 2023-12-28 NOTE — Progress Notes (Signed)
        Established patient visit  History, exam, impression, and plan:  1. Type 2 diabetes mellitus without complication, without long-term current use of insulin (HCC) (Primary) Very pleasant 78 year old male presenting today for follow-up on type 2 diabetes.  3 months ago, his A1c had crept up to 7.2%.  He is currently taking metformin  750 mg daily, tolerating well without side effects.  Has worked to make dietary modifications and be more cognizant of limitation of carbohydrates and sweets.  He has not been exercising but does work in the yard on a regular basis.  Recheck of hemoglobin A1c at 6.3% today showing marked improvement.  He is now in the controlled range so plan to continue metformin  750 mg once daily as prescribed.  Continue working on dietary modifications.  Increase intentional exercise as the weather permits. - metFORMIN  (GLUCOPHAGE -XR) 750 MG 24 hr tablet; Take 1 tablet (750 mg total) by mouth daily with breakfast.  Dispense: 90 tablet; Refill: 3  2. Benign essential tremor Managed by neurology.  Doing well on primidone  300 mg daily.  3. Hyperlipidemia associated with type 2 diabetes mellitus (HCC) Taking lovastatin  80 mg daily, tolerating well without side effects.  Last lipid check 3 months ago so deferring today.  Continue lovastatin  as prescribed.  4. Essential hypertension He is currently taking enalapril  10 mg daily, tolerating well without side effects.  Reports home blood pressure readings are good around 120s/70s.  Blood pressure is elevated today at 153/73.  Recheck only mildly improved and still not at goal.  Advised him to monitor his blood pressures at home for the next 2 weeks and update us  via phone call or MyChart message.  If I have not heard from him, I will initiate contact to see how his home readings are.  For now continue enalapril  10 mg daily.  Work on a low-sodium diet and continue working to increase regular intentional activity. - enalapril  (VASOTEC ) 10  MG tablet; Take 1 tablet (10 mg total) by mouth daily.  Dispense: 90 tablet; Refill: 3  Procedures performed this visit: None.  Return in about 6 months (around 06/26/2024) for DM/HTN/HLD follow up.  __________________________________ Zada FREDRIK Palin, DNP, APRN, FNP-BC Primary Care and Sports Medicine [facility] [place]

## 2024-01-07 DIAGNOSIS — Z87891 Personal history of nicotine dependence: Secondary | ICD-10-CM | POA: Diagnosis not present

## 2024-01-07 DIAGNOSIS — Z7984 Long term (current) use of oral hypoglycemic drugs: Secondary | ICD-10-CM | POA: Diagnosis not present

## 2024-01-07 DIAGNOSIS — Z79899 Other long term (current) drug therapy: Secondary | ICD-10-CM | POA: Diagnosis not present

## 2024-01-07 DIAGNOSIS — Z7982 Long term (current) use of aspirin: Secondary | ICD-10-CM | POA: Diagnosis not present

## 2024-01-07 DIAGNOSIS — G8918 Other acute postprocedural pain: Secondary | ICD-10-CM | POA: Diagnosis not present

## 2024-01-07 DIAGNOSIS — M720 Palmar fascial fibromatosis [Dupuytren]: Secondary | ICD-10-CM | POA: Diagnosis not present

## 2024-01-07 DIAGNOSIS — E119 Type 2 diabetes mellitus without complications: Secondary | ICD-10-CM | POA: Diagnosis not present

## 2024-01-08 ENCOUNTER — Ambulatory Visit (INDEPENDENT_AMBULATORY_CARE_PROVIDER_SITE_OTHER): Payer: Medicare PPO

## 2024-01-08 VITALS — Ht 70.0 in | Wt 173.0 lb

## 2024-01-08 DIAGNOSIS — Z0000 Encounter for general adult medical examination without abnormal findings: Secondary | ICD-10-CM

## 2024-01-08 NOTE — Progress Notes (Signed)
 Subjective:   Patrick Perkins is a 78 y.o. male who presents for Medicare Annual/Subsequent preventive examination.  Visit Complete: Virtual I connected with  Patrick Perkins on 01/08/24 by a audio enabled telemedicine application and verified that I am speaking with the correct person using two identifiers.  Patient Location: Home  Provider Location: Office/Clinic  I discussed the limitations of evaluation and management by telemedicine. The patient expressed understanding and agreed to proceed.  Vital Signs: Because this visit was a virtual/telehealth visit, some criteria may be missing or patient reported. Any vitals not documented were not able to be obtained and vitals that have been documented are patient reported.  Patient Medicare AWV questionnaire was completed by the patient on 01/06/2024; I have confirmed that all information answered by patient is correct and no changes since this date.  Cardiac Risk Factors include: advanced age (>74men, >56 women);male gender;smoking/ tobacco exposure;diabetes mellitus;hypertension     Objective:    Today's Vitals   01/08/24 1301  Weight: 173 lb (78.5 kg)  Height: 5\' 10"  (1.778 m)  PainSc: 5    Body mass index is 24.82 kg/m.     01/08/2024    1:12 PM 01/05/2023    1:09 PM 12/30/2021    1:09 PM 08/19/2019    8:05 AM 09/19/2018   11:08 AM 08/07/2018    9:03 AM 05/07/2018    8:58 AM  Advanced Directives  Does Patient Have a Medical Advance Directive? Yes Yes Yes Yes Yes Yes Yes  Type of Estate agent of [place];Living will Living will Living will;Healthcare Power of [employer] Power of [place];Living will Healthcare Power of [place];Living will Living will Living will  Does patient want to make changes to medical advance directive? No - Patient declined No - Patient declined No - Patient declined No - Patient declined  No - Patient declined   Copy of Healthcare Power of Attorney in Chart? No -  copy requested  No - copy requested No - copy requested No - copy requested      Current Medications (verified) Outpatient Encounter Medications as of 01/08/2024  Medication Sig   AMBULATORY NON FORMULARY MEDICATION One Touch Verio glucose monitor test strips and lancets.  Check blood sugar daily. Dx type 2 diabetes. E11.9   enalapril (VASOTEC) 10 MG tablet Take 1 tablet (10 mg total) by mouth daily.   glucose blood (ACCU-CHEK AVIVA) test strip Test blood sugar twice daily. Dx: E11.9   HYDROcodone-acetaminophen (NORCO) 7.5-325 MG tablet Take by mouth.   lovastatin (MEVACOR) 40 MG tablet Take 2 tablets (80 mg total) by mouth at bedtime.   metFORMIN (GLUCOPHAGE-XR) 750 MG 24 hr tablet Take 1 tablet (750 mg total) by mouth daily with breakfast.   primidone (MYSOLINE) 250 MG tablet Take 250 mg by mouth at bedtime.   primidone (MYSOLINE) 50 MG tablet Take by mouth at bedtime.   propranolol (INDERAL) 10 MG tablet Take 10 mg by mouth in the morning and at bedtime.   Multiple Vitamins-Minerals (MULTIVITAMIN PO) Take by mouth. (Patient not taking: Reported on 01/08/2024)   ondansetron (ZOFRAN) 4 MG tablet Take by mouth. (Patient not taking: Reported on 01/08/2024)   No facility-administered encounter medications on file as of 01/08/2024.    Allergies (verified) Patient has no known allergies.   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  Tobacco Use   Smoking status: Former    Current packs/day: 0.00    Average packs/day: 1 pack/day for 30.0 years (30.0 ttl pk-yrs)    Types: Cigarettes    Start date: 10/01/1979    Quit date: 09/30/2009    Years since quitting: 14.2   Smokeless tobacco: Never  Vaping Use   Vaping status: Never Used  Substance and Sexual Activity   Alcohol use: Yes    Alcohol/week: 5.0 standard drinks of alcohol    Types: 1 Cans of beer, 4 Standard drinks or equivalent per week    Comment: occasionally   Drug use: Never   Sexual activity: Not Currently  Other Topics Concern   Not on file  Social History Narrative   Lives with his wife. He has one child. He enjoys playing golf and yard work.   Social Drivers of Corporate investment banker Strain: Low Risk  (01/08/2024)   Overall Financial Resource Strain (CARDIA)    Difficulty of Paying Living Expenses: Not hard at all  Food Insecurity: No Food Insecurity (01/08/2024)   Hunger Vital Sign    Worried About Running Out of Food in the Last Year: Never true    Ran Out of Food in the Last Year: Never true  Transportation Needs: No Transportation Needs (01/08/2024)   PRAPARE - Administrator, Civil Service (Medical): No    Lack of Transportation (Non-Medical): No  Physical Activity: Insufficiently Active (01/08/2024)   Exercise Vital Sign    Days of Exercise per Week: 3 days    Minutes of Exercise per Session: 30 min  Stress: No Stress Concern Present (01/08/2024)   [employer] of Occupational Health - Occupational Stress Questionnaire    Feeling of Stress : Not at all  Social Connections: Moderately Integrated (01/08/2024)   Social Connection and Isolation Panel [NHANES]    Frequency of Communication with Friends and Family: More than three times a week    Frequency of Social Gatherings with Friends and Family: Three times a week    Attends Religious Services: Never    Active Member of Clubs or Organizations:  Yes    Attends Engineer, structural: More than 4 times per year    Marital Status: Married    Tobacco Counseling Counseling given: Not Answered   Clinical Intake:  Pre-visit preparation completed: Yes  Pain : 0-10 Pain Score: 5  Pain Type: Acute pain Pain Location: Hand Pain Orientation: Right Pain Descriptors / Indicators: Aching Pain Onset: More than a month ago Pain Frequency: Constant Pain Relieving Factors: Pain medication Effect of Pain on Daily Activities: He is right handed. He is limited due to having surgery.  Pain Relieving Factors: Pain medication  BMI - recorded: 24.82 Nutritional Status: BMI of 19-24  Normal Nutritional Risks: None Diabetes: Yes CBG done?: No Did pt. bring in CBG monitor from home?: No  How often do  you need to have someone help you when you read instructions, pamphlets, or other written materials from your doctor or pharmacy?: 1 - Never What is the last grade level you completed in school?: 16  Interpreter Needed?: No      Activities of Daily Living    01/08/2024    1:05 PM 01/04/2024   10:50 AM  In your present state of health, do you have any difficulty performing the following activities:  Hearing? 0 0  Vision? 0 0  Difficulty concentrating or making decisions? 0 0  Walking or climbing stairs? 0 0  Dressing or bathing? 0 0  Doing errands, shopping?  0  Preparing Food and eating ? N N  Using the Toilet? N N  In the past six months, have you accidently leaked urine? N N  Do you have problems with loss of bowel control? N N  Managing your Medications? N N  Managing your Finances? N N  Housekeeping or managing your Housekeeping? N N    Patient Care Team: Christen Butter, NP as PCP - General (Nurse Practitioner) Reginia Naas, MD as Referring Physician (Dermatology) Burnett Corrente, MD as Referring Physician (Neurology)  Indicate any recent Medical Services you may have received from other than [facility] providers in the past  year (date may be approximate).     Assessment:   This is a routine wellness examination for Patrick Perkins.  Hearing/Vision screen Hearing Screening - Comments:: Unable to check Vision Screening - Comments:: Unable to check   Goals Addressed             This Visit's Progress    Activity and Exercise Increased       He would like to increase exercise.       Depression Screen    01/08/2024    1:11 PM 12/28/2023   10:25 AM 01/05/2023    1:10 PM 09/12/2022   10:27 AM 07/31/2022    9:30 AM 03/07/2022   10:00 AM 12/30/2021    1:10 PM  PHQ 2/9 Scores  PHQ - 2 Score 0 0 0 0 0 0 0    Fall Risk    01/08/2024    1:13 PM 01/04/2024   10:50 AM 12/28/2023   10:25 AM 01/05/2023    1:09 PM 01/02/2023    4:28 PM  Fall Risk   Falls in the past year? 0 0 0 0 0  Number falls in past yr: 0 0 0 0 0  Injury with Fall? 0 0 0 0   Risk for fall due to : No Fall Risks  No Fall Risks No Fall Risks   Follow up Falls evaluation completed  Falls evaluation completed Falls evaluation completed     MEDICARE RISK AT HOME: Medicare Risk at Home Any stairs in or around the home?: No If so, are there any without handrails?: No Home free of loose throw rugs in walkways, pet beds, electrical cords, etc?: Yes Adequate lighting in your home to reduce risk of falls?: Yes Life alert?: No Use of a cane, walker or w/c?: No Grab bars in the bathroom?: Yes Shower chair or bench in shower?: (Patient-Rptd) Yes Elevated toilet seat or a handicapped toilet?: No  TIMED UP AND GO:  Was the test performed?  No    Cognitive Function:        01/08/2024    1:14 PM 01/05/2023    1:11 PM 12/30/2021    1:14 PM 08/19/2019    8:09 AM 08/07/2018  9:09 AM  6CIT Screen  What Year? 0 points 0 points 0 points 0 points 0 points  What month? 0 points 0 points 0 points 0 points 0 points  What time? 0 points 0 points 0 points 0 points 0 points  Count back from 20 0 points 0 points 0 points 0 points 0 points  Months in reverse 0  points 0 points 0 points 0 points 0 points  Repeat phrase 0 points 0 points 0 points 0 points 0 points  Total Score 0 points 0 points 0 points 0 points 0 points    Immunizations Immunization History  Administered Date(s) Administered   Fluad Quad(high Dose 65+) 09/05/2019, 09/07/2020, 09/06/2021, 09/12/2022   H1N1 12/14/2008   Influenza Split 09/04/2011, 09/03/2012   Influenza Whole 09/06/2009, 09/20/2010, 08/20/2013   Influenza, High Dose Seasonal PF 09/18/2017, 08/09/2023   Influenza,inj,Quad PF,6+ Mos 09/10/2018   Influenza-Unspecified 08/20/2014, 07/22/2015, 09/03/2016   PFIZER(Purple Top)SARS-COV-2 Vaccination 03/02/2020, 03/22/2020   Pneumococcal Conjugate-13 04/23/2015   Pneumococcal Polysaccharide-23 09/20/2010, 04/03/2017   Rsv, Mab, Judi Cong, 0.5 Ml, Neonate To 24 Mos(Beyfortus) 12/05/2023   Td 12/14/2008   Tdap 10/29/2018   Zoster Recombinant(Shingrix) 04/07/2017, 06/21/2017   Zoster, Live 02/29/2012    TDAP status: Up to date  Flu Vaccine status: Up to date  Pneumococcal vaccine status: Due, Education has been provided regarding the importance of this vaccine. Advised may receive this vaccine at local pharmacy or Health Dept. Aware to provide a copy of the vaccination record if obtained from local pharmacy or Health Dept. Verbalized acceptance and understanding.  Covid-19 vaccine status: Completed vaccines  Qualifies for Shingles Vaccine? Yes   Zostavax completed Yes   Shingrix Completed?: Yes  Screening Tests Health Maintenance  Topic Date Due   OPHTHALMOLOGY EXAM  01/30/2024   Lung Cancer Screening  02/21/2024   Diabetic kidney evaluation - Urine ACR  03/20/2024   HEMOGLOBIN A1C  06/26/2024   Diabetic kidney evaluation - eGFR measurement  09/20/2024   FOOT EXAM  09/20/2024   Medicare Annual Wellness (AWV)  01/07/2025   DTaP/Tdap/Td (3 - Td or Tdap) 10/29/2028   Pneumonia Vaccine 71+ Years old  Completed   INFLUENZA VACCINE  Completed   Hepatitis  C Screening  Completed   Zoster Vaccines- Shingrix  Completed   HPV VACCINES  Aged Out   Colonoscopy  Discontinued   COVID-19 Vaccine  Discontinued    Health Maintenance  There are no preventive care reminders to display for this patient.   Colorectal cancer screening: Type of screening: Colonoscopy. Completed 09/14/2019. Repeat every 10 years  Lung Cancer Screening: (Low Dose CT Chest recommended if Age 75-80 years, 20 pack-year currently smoking OR have quit w/in 15years.) does qualify.   Lung Cancer Screening Referral: Patient is part of the lung screening program.   Additional Screening:  Hepatitis C Screening: does qualify; Completed 01/14/2019  Vision Screening: Recommended annual ophthalmology exams for early detection of glaucoma and other disorders of the eye. Is the patient up to date with their annual eye exam?  Yes  Who is the provider or what is the name of the office in which the patient attends annual eye exams? Dr Tiburcio Pea If pt is not established with a provider, would they like to be referred to a provider to establish care?  N/a .   Dental Screening: Recommended annual dental exams for proper oral hygiene  Diabetic Foot Exam: Diabetic Foot Exam: Completed 09/21/2023  Community Resource Referral / Chronic Care Management: CRR required  this visit?  No   CCM required this visit?  No     Plan:     I have personally reviewed and noted the following in the patient's chart:   Medical and social history Use of alcohol, tobacco or illicit drugs  Current medications and supplements including opioid prescriptions. Patient is currently taking opioid prescriptions. Information provided to patient regarding non-opioid alternatives. Patient advised to discuss non-opioid treatment plan with their provider. Functional ability and status Nutritional status Physical activity Advanced directives List of other physicians Hospitalizations, surgeries, and ER visits in  previous 12 months Vitals Screenings to include cognitive, depression, and falls Referrals and appointments  In addition, I have reviewed and discussed with patient certain preventive protocols, quality metrics, and best practice recommendations. A written personalized care plan for preventive services as well as general preventive health recommendations were provided to patient.     Esmond Harps, CMA   01/08/2024   After Visit Summary: (MyChart) Due to this being a telephonic visit, the after visit summary with patients personalized plan was offered to patient via MyChart   Nurse Notes:   Patrick Perkins is a 78 y.o. male patient of Christen Butter, NP who had a Medicare Annual Wellness Visit today via telephone. Patrick Perkins is Retired and lives with their spouse. He has 1 child. he reports that he is socially active and does interact with friends/family regularly. He is moderately physically active and enjoys playing golf and yardwork.   He did have surgery on his right hand yesterday. They tell him it will take up to 6 weeks to heal completely. He does depend on his wife during this time.   Recommend continued lung cancer screening.  Recommend Prevnar 20 next time is has an appointment with Christen Butter, NP.

## 2024-01-08 NOTE — Patient Instructions (Signed)
  Patrick Perkins , Thank you for taking time to come for your Medicare Wellness Visit. I appreciate your ongoing commitment to your health goals. Please review the following plan we discussed and let me know if I can assist you in the future.   These are the goals we discussed:  Goals       Activity and Exercise Increased      He would like to increase exercise.       DIET - REDUCE SODIUM INTAKE      Decrease salt intake to help keep blood pressure unde control.      Exercise 150 min/wk Moderate Activity      Continue to exercise as you are doing. Your doing a great job at staying healthy.      Exercise 3x per week (30 min per time)      Start back exercising like he used to getting more walking in. Would like to loose some weight as well.      Patient Stated (pt-stated)      12/30/2021 AWV Goal: Exercise for General Health  Patient will verbalize understanding of the benefits of increased physical activity: Exercising regularly is important. It will improve your overall fitness, flexibility, and endurance. Regular exercise also will improve your overall health. It can help you control your weight, reduce stress, and improve your bone density. Over the next year, patient will increase physical activity as tolerated with a goal of at least 150 minutes of moderate physical activity per week.  You can tell that you are exercising at a moderate intensity if your heart starts beating faster and you start breathing faster but can still hold a conversation. Moderate-intensity exercise ideas include: Walking 1 mile (1.6 km) in about 15 minutes Biking Hiking Golfing Dancing Water aerobics Patient will verbalize understanding of everyday activities that increase physical activity by providing examples like the following: Yard work, such as: Insurance underwriter Gardening Washing windows or floors Patient will  be able to explain general safety guidelines for exercising:  Before you start a new exercise program, talk with your health care provider. Do not exercise so much that you hurt yourself, feel dizzy, or get very short of breath. Wear comfortable clothes and wear shoes with good support. Drink plenty of water while you exercise to prevent dehydration or heat stroke. Work out until your breathing and your heartbeat get faster.       Patient Stated (pt-stated)      Patient stated that he would like to increase activity level.        This is a list of the screening recommended for you and due dates:  Health Maintenance  Topic Date Due   Eye exam for diabetics  01/30/2024   Screening for Lung Cancer  02/21/2024   Yearly kidney health urinalysis for diabetes  03/20/2024   Hemoglobin A1C  06/26/2024   Yearly kidney function blood test for diabetes  09/20/2024   Complete foot exam   09/20/2024   Medicare Annual Wellness Visit  01/07/2025   DTaP/Tdap/Td vaccine (3 - Td or Tdap) 10/29/2028   Pneumonia Vaccine  Completed   Flu Shot  Completed   Hepatitis C Screening  Completed   Zoster (Shingles) Vaccine  Completed   HPV Vaccine  Aged Out   Colon Cancer Screening  Discontinued   COVID-19 Vaccine  Discontinued

## 2024-01-14 DIAGNOSIS — M720 Palmar fascial fibromatosis [Dupuytren]: Secondary | ICD-10-CM | POA: Diagnosis not present

## 2024-01-14 DIAGNOSIS — M79641 Pain in right hand: Secondary | ICD-10-CM | POA: Diagnosis not present

## 2024-01-14 DIAGNOSIS — M25641 Stiffness of right hand, not elsewhere classified: Secondary | ICD-10-CM | POA: Diagnosis not present

## 2024-01-14 DIAGNOSIS — M79644 Pain in right finger(s): Secondary | ICD-10-CM | POA: Diagnosis not present

## 2024-01-21 DIAGNOSIS — M25641 Stiffness of right hand, not elsewhere classified: Secondary | ICD-10-CM | POA: Diagnosis not present

## 2024-01-21 DIAGNOSIS — M720 Palmar fascial fibromatosis [Dupuytren]: Secondary | ICD-10-CM | POA: Diagnosis not present

## 2024-01-21 DIAGNOSIS — M79641 Pain in right hand: Secondary | ICD-10-CM | POA: Diagnosis not present

## 2024-01-23 DIAGNOSIS — L570 Actinic keratosis: Secondary | ICD-10-CM | POA: Diagnosis not present

## 2024-01-23 DIAGNOSIS — L820 Inflamed seborrheic keratosis: Secondary | ICD-10-CM | POA: Diagnosis not present

## 2024-01-28 DIAGNOSIS — M79641 Pain in right hand: Secondary | ICD-10-CM | POA: Diagnosis not present

## 2024-01-28 DIAGNOSIS — M720 Palmar fascial fibromatosis [Dupuytren]: Secondary | ICD-10-CM | POA: Diagnosis not present

## 2024-01-28 DIAGNOSIS — M25641 Stiffness of right hand, not elsewhere classified: Secondary | ICD-10-CM | POA: Diagnosis not present

## 2024-02-04 DIAGNOSIS — M720 Palmar fascial fibromatosis [Dupuytren]: Secondary | ICD-10-CM | POA: Diagnosis not present

## 2024-02-04 DIAGNOSIS — M25641 Stiffness of right hand, not elsewhere classified: Secondary | ICD-10-CM | POA: Diagnosis not present

## 2024-02-04 DIAGNOSIS — M79641 Pain in right hand: Secondary | ICD-10-CM | POA: Diagnosis not present

## 2024-02-12 DIAGNOSIS — M79641 Pain in right hand: Secondary | ICD-10-CM | POA: Diagnosis not present

## 2024-02-12 DIAGNOSIS — M720 Palmar fascial fibromatosis [Dupuytren]: Secondary | ICD-10-CM | POA: Diagnosis not present

## 2024-02-12 DIAGNOSIS — M25641 Stiffness of right hand, not elsewhere classified: Secondary | ICD-10-CM | POA: Diagnosis not present

## 2024-02-13 DIAGNOSIS — D3132 Benign neoplasm of left choroid: Secondary | ICD-10-CM | POA: Diagnosis not present

## 2024-02-13 DIAGNOSIS — Z961 Presence of intraocular lens: Secondary | ICD-10-CM | POA: Diagnosis not present

## 2024-02-13 DIAGNOSIS — H43813 Vitreous degeneration, bilateral: Secondary | ICD-10-CM | POA: Diagnosis not present

## 2024-02-13 DIAGNOSIS — H40013 Open angle with borderline findings, low risk, bilateral: Secondary | ICD-10-CM | POA: Diagnosis not present

## 2024-02-13 DIAGNOSIS — E119 Type 2 diabetes mellitus without complications: Secondary | ICD-10-CM | POA: Diagnosis not present

## 2024-02-13 LAB — HM DIABETES EYE EXAM

## 2024-02-28 ENCOUNTER — Other Ambulatory Visit: Payer: Self-pay

## 2024-02-28 ENCOUNTER — Telehealth: Payer: Self-pay

## 2024-02-28 DIAGNOSIS — Z122 Encounter for screening for malignant neoplasm of respiratory organs: Secondary | ICD-10-CM

## 2024-02-28 DIAGNOSIS — Z87891 Personal history of nicotine dependence: Secondary | ICD-10-CM

## 2024-02-28 DIAGNOSIS — F17210 Nicotine dependence, cigarettes, uncomplicated: Secondary | ICD-10-CM

## 2024-02-28 NOTE — Telephone Encounter (Signed)
 Returned call. Annual CT scheduled at [facility] 03/05/2024 at 1:00pm

## 2024-02-28 NOTE — Telephone Encounter (Signed)
 Copied from CRM [id number]. Topic: General - Other >> Feb 28, 2024 11:47 AM Brennan Bailey S wrote: Reason for CRM: patient is returning a call to schedule lung cancer screening. Please call patient twice  Routing to Lung cancer screening nurses.

## 2024-03-01 ENCOUNTER — Other Ambulatory Visit: Payer: Self-pay | Admitting: Medical-Surgical

## 2024-03-01 DIAGNOSIS — E119 Type 2 diabetes mellitus without complications: Secondary | ICD-10-CM

## 2024-03-04 DIAGNOSIS — M720 Palmar fascial fibromatosis [Dupuytren]: Secondary | ICD-10-CM | POA: Diagnosis not present

## 2024-03-04 DIAGNOSIS — M25641 Stiffness of right hand, not elsewhere classified: Secondary | ICD-10-CM | POA: Diagnosis not present

## 2024-03-04 DIAGNOSIS — M79641 Pain in right hand: Secondary | ICD-10-CM | POA: Diagnosis not present

## 2024-03-05 ENCOUNTER — Ambulatory Visit

## 2024-03-05 DIAGNOSIS — Z122 Encounter for screening for malignant neoplasm of respiratory organs: Secondary | ICD-10-CM | POA: Diagnosis not present

## 2024-03-05 DIAGNOSIS — Z87891 Personal history of nicotine dependence: Secondary | ICD-10-CM | POA: Diagnosis not present

## 2024-03-05 DIAGNOSIS — F17210 Nicotine dependence, cigarettes, uncomplicated: Secondary | ICD-10-CM

## 2024-03-17 DIAGNOSIS — M720 Palmar fascial fibromatosis [Dupuytren]: Secondary | ICD-10-CM | POA: Diagnosis not present

## 2024-03-17 DIAGNOSIS — M25641 Stiffness of right hand, not elsewhere classified: Secondary | ICD-10-CM | POA: Diagnosis not present

## 2024-03-17 DIAGNOSIS — M79641 Pain in right hand: Secondary | ICD-10-CM | POA: Diagnosis not present

## 2024-03-26 DIAGNOSIS — G250 Essential tremor: Secondary | ICD-10-CM | POA: Diagnosis not present

## 2024-03-26 DIAGNOSIS — M542 Cervicalgia: Secondary | ICD-10-CM | POA: Diagnosis not present

## 2024-04-03 ENCOUNTER — Other Ambulatory Visit: Payer: Self-pay

## 2024-04-03 DIAGNOSIS — M720 Palmar fascial fibromatosis [Dupuytren]: Secondary | ICD-10-CM | POA: Diagnosis not present

## 2024-04-03 DIAGNOSIS — M25641 Stiffness of right hand, not elsewhere classified: Secondary | ICD-10-CM | POA: Diagnosis not present

## 2024-04-03 DIAGNOSIS — M79641 Pain in right hand: Secondary | ICD-10-CM | POA: Diagnosis not present

## 2024-04-15 DIAGNOSIS — M25641 Stiffness of right hand, not elsewhere classified: Secondary | ICD-10-CM | POA: Diagnosis not present

## 2024-04-15 DIAGNOSIS — M720 Palmar fascial fibromatosis [Dupuytren]: Secondary | ICD-10-CM | POA: Diagnosis not present

## 2024-04-15 DIAGNOSIS — M79641 Pain in right hand: Secondary | ICD-10-CM | POA: Diagnosis not present

## 2024-04-29 DIAGNOSIS — M79641 Pain in right hand: Secondary | ICD-10-CM | POA: Diagnosis not present

## 2024-04-29 DIAGNOSIS — M720 Palmar fascial fibromatosis [Dupuytren]: Secondary | ICD-10-CM | POA: Diagnosis not present

## 2024-04-29 DIAGNOSIS — M25641 Stiffness of right hand, not elsewhere classified: Secondary | ICD-10-CM | POA: Diagnosis not present

## 2024-05-21 DIAGNOSIS — D235 Other benign neoplasm of skin of trunk: Secondary | ICD-10-CM | POA: Diagnosis not present

## 2024-05-21 DIAGNOSIS — L905 Scar conditions and fibrosis of skin: Secondary | ICD-10-CM | POA: Diagnosis not present

## 2024-05-21 DIAGNOSIS — Z129 Encounter for screening for malignant neoplasm, site unspecified: Secondary | ICD-10-CM | POA: Diagnosis not present

## 2024-05-21 DIAGNOSIS — L578 Other skin changes due to chronic exposure to nonionizing radiation: Secondary | ICD-10-CM | POA: Diagnosis not present

## 2024-05-21 DIAGNOSIS — L821 Other seborrheic keratosis: Secondary | ICD-10-CM | POA: Diagnosis not present

## 2024-05-21 DIAGNOSIS — L820 Inflamed seborrheic keratosis: Secondary | ICD-10-CM | POA: Diagnosis not present

## 2024-05-21 DIAGNOSIS — W908XXS Exposure to other nonionizing radiation, sequela: Secondary | ICD-10-CM | POA: Diagnosis not present

## 2024-06-25 ENCOUNTER — Ambulatory Visit: Payer: Medicare PPO | Admitting: Medical-Surgical

## 2024-07-08 ENCOUNTER — Ambulatory Visit: Admitting: Medical-Surgical

## 2024-07-14 ENCOUNTER — Ambulatory Visit (INDEPENDENT_AMBULATORY_CARE_PROVIDER_SITE_OTHER): Admitting: Medical-Surgical

## 2024-07-14 ENCOUNTER — Encounter: Payer: Self-pay | Admitting: Medical-Surgical

## 2024-07-14 VITALS — BP 149/75 | HR 61 | Resp 20 | Ht 70.0 in | Wt 176.1 lb

## 2024-07-14 DIAGNOSIS — J438 Other emphysema: Secondary | ICD-10-CM

## 2024-07-14 DIAGNOSIS — E119 Type 2 diabetes mellitus without complications: Secondary | ICD-10-CM | POA: Diagnosis not present

## 2024-07-14 DIAGNOSIS — I152 Hypertension secondary to endocrine disorders: Secondary | ICD-10-CM | POA: Diagnosis not present

## 2024-07-14 DIAGNOSIS — Z7984 Long term (current) use of oral hypoglycemic drugs: Secondary | ICD-10-CM | POA: Diagnosis not present

## 2024-07-14 DIAGNOSIS — E1159 Type 2 diabetes mellitus with other circulatory complications: Secondary | ICD-10-CM | POA: Diagnosis not present

## 2024-07-14 DIAGNOSIS — I700 Atherosclerosis of aorta: Secondary | ICD-10-CM | POA: Diagnosis not present

## 2024-07-14 LAB — POCT UA - MICROALBUMIN
Creatinine, POC: 200 mg/dL
Microalbumin Ur, POC: 80 mg/L

## 2024-07-14 LAB — POCT GLYCOSYLATED HEMOGLOBIN (HGB A1C)
HbA1c, POC (controlled diabetic range): 7.8 % — AB (ref 0.0–7.0)
Hemoglobin A1C: 7.8 % — AB (ref 4.0–5.6)

## 2024-07-14 MED ORDER — METFORMIN HCL ER 750 MG PO TB24: 750.0000 mg | ORAL_TABLET | Freq: Two times a day (BID) | ORAL | 3 refills | Status: DC

## 2024-07-14 NOTE — Progress Notes (Signed)
        Established patient visit   History of Present Illness   Discussed the use of AI scribe software for clinical note transcription with the patient, who gave verbal consent to proceed.  History of Present Illness   Patrick Perkins is a 78 year old male with type 2 diabetes who presents with elevated blood sugar levels.  Type 2 diabetes and glycemic control - Type 2 diabetes mellitus with A1c at 6.3% (February) - Fasting blood glucose levels average 180 mg/dL - No change in diet, admits eating poor dietary choices - Metformin  750 mg once daily with breakfast; perceived lack of efficacy - No gastrointestinal side effects from metformin  - Previously took metformin  850 mg in 2020, discontinued due to good glycemic control  Decreased physical activity - Decreased physical activity for six months following hand surgery - Unable to play golf or engage in other activities during this period - Recently started to resume physical activities  Hypertension - Blood pressure elevated this morning - Takes enalapril  as part of antihypertensive regimen       Physical Exam   Physical Exam Vitals and nursing note reviewed.  Constitutional:      General: He is not in acute distress.    Appearance: Normal appearance. He is normal weight. He is not ill-appearing.  HENT:     Head: Normocephalic and atraumatic.  Cardiovascular:     Rate and Rhythm: Normal rate and regular rhythm.     Pulses: Normal pulses.     Heart sounds: Normal heart sounds. No murmur heard.    No friction rub. No gallop.  Pulmonary:     Effort: Pulmonary effort is normal. No respiratory distress.     Breath sounds: Normal breath sounds.  Skin:    General: Skin is warm and dry.  Neurological:     Mental Status: He is alert and oriented to person, place, and time.  Psychiatric:        Mood and Affect: Mood normal.        Behavior: Behavior normal.        Thought Content: Thought content normal.         Judgment: Judgment normal.    Assessment & Plan   Assessment and Plan    Type 2 Diabetes Mellitus A1c increased from 6.3% to 7.8%. Current metformin  regimen may be insufficient. Emphasized diet and exercise importance. - Increase metformin  to 750 mg twice daily. - Encourage increased physical activity. - Recheck A1c in 3 months. - Monitor blood glucose levels regularly.   Hypertension On Enalapril  with usually good blood pressures. Was elevated this morning then normal after rechecking 10 minutes later. Elevated here in the office.  - Continue Enalapril  10mg  daily.  - Continue monitoring home readings, if consistently higher than 130/80, return for medication adjustment.      Follow up   Return in about 3 months (around 10/14/2024) for DM follow up. __________________________________ Zada FREDRIK Palin, DNP, APRN, FNP-BC Primary Care and Sports Medicine [facility] [place]

## 2024-07-15 ENCOUNTER — Ambulatory Visit: Payer: Self-pay | Admitting: Medical-Surgical

## 2024-07-15 LAB — CBC
Hematocrit: 37.9 % (ref 37.5–51.0)
Hemoglobin: 12.9 g/dL — ABNORMAL LOW (ref 13.0–17.7)
MCH: 34.1 pg — ABNORMAL HIGH (ref 26.6–33.0)
MCHC: 34 g/dL (ref 31.5–35.7)
MCV: 100 fL — ABNORMAL HIGH (ref 79–97)
Platelets: 271 x10E3/uL (ref 150–450)
RBC: 3.78 x10E6/uL — ABNORMAL LOW (ref 4.14–5.80)
RDW: 12.4 % (ref 11.6–15.4)
WBC: 7.1 x10E3/uL (ref 3.4–10.8)

## 2024-07-15 LAB — CMP14+EGFR
ALT: 19 IU/L (ref 0–44)
AST: 17 IU/L (ref 0–40)
Albumin: 4.4 g/dL (ref 3.8–4.8)
Alkaline Phosphatase: 140 IU/L — ABNORMAL HIGH (ref 44–121)
BUN/Creatinine Ratio: 10 (ref 10–24)
BUN: 9 mg/dL (ref 8–27)
Bilirubin Total: 0.3 mg/dL (ref 0.0–1.2)
CO2: 20 mmol/L (ref 20–29)
Calcium: 9.1 mg/dL (ref 8.6–10.2)
Chloride: 101 mmol/L (ref 96–106)
Creatinine, Ser: 0.94 mg/dL (ref 0.76–1.27)
Globulin, Total: 2.2 g/dL (ref 1.5–4.5)
Glucose: 222 mg/dL — ABNORMAL HIGH (ref 70–99)
Potassium: 4.6 mmol/L (ref 3.5–5.2)
Sodium: 138 mmol/L (ref 134–144)
Total Protein: 6.6 g/dL (ref 6.0–8.5)
eGFR: 84 mL/min/1.73 (ref >59)

## 2024-07-15 LAB — LIPID PANEL
Chol/HDL Ratio: 2.4 ratio (ref 0.0–5.0)
Cholesterol, Total: 187 mg/dL (ref 100–199)
HDL: 78 mg/dL (ref >39)
LDL Chol Calc (NIH): 84 mg/dL (ref 0–99)
Triglycerides: 146 mg/dL (ref 0–149)
VLDL Cholesterol Cal: 25 mg/dL (ref 5–40)

## 2024-07-18 ENCOUNTER — Other Ambulatory Visit: Payer: Self-pay | Admitting: Medical-Surgical

## 2024-07-18 DIAGNOSIS — E119 Type 2 diabetes mellitus without complications: Secondary | ICD-10-CM

## 2024-07-22 ENCOUNTER — Encounter: Payer: Self-pay | Admitting: Sports Medicine

## 2024-08-18 ENCOUNTER — Ambulatory Visit
Admission: RE | Admit: 2024-08-18 | Discharge: 2024-08-18 | Disposition: A | Discharge status: Home / Self Care | Attending: Emergency Medicine | Admitting: Emergency Medicine

## 2024-08-18 VITALS — BP 131/81 | HR 73 | Temp 98.5°F | Resp 17

## 2024-08-18 DIAGNOSIS — J029 Acute pharyngitis, unspecified: Secondary | ICD-10-CM

## 2024-08-18 DIAGNOSIS — J069 Acute upper respiratory infection, unspecified: Secondary | ICD-10-CM | POA: Diagnosis not present

## 2024-08-18 LAB — POCT RAPID STREP A (OFFICE): Rapid Strep A Screen: NEGATIVE

## 2024-08-18 LAB — POC SARS CORONAVIRUS 2 AG -  ED: SARS Coronavirus 2 Ag: NEGATIVE

## 2024-08-18 MED ORDER — GUAIFENESIN ER 600 MG PO TB12: 1200.0000 mg | ORAL_TABLET | Freq: Two times a day (BID) | ORAL | 0 refills | Status: DC

## 2024-08-18 NOTE — ED Triage Notes (Signed)
 Pt c/o sore throat x 2 days. Nasal congestion/post nasal drainage started 4 days ago. Denies fever. Taking dayquil/nyquil prn.

## 2024-08-18 NOTE — ED Provider Notes (Signed)
 Patrick [facility]    CSN: [id number] Arrival date & time: 08/18/24  0940      History   Chief Complaint Chief Complaint  Patient presents with   Sore Throat   Nasal Congestion    HPI Patrick Perkins is a 78 y.o. male.  3-4 day history of nasal congestion and drainage 2 days ago started having sore throat Not much cough Denies fever or chills. No abd pain, NVD, rash Sick contact recently with same symptoms No recent travel  Has used Tylenol, DayQuil and NyQuil.  Past Medical History:  Diagnosis Date   Diabetes mellitus    ED (erectile dysfunction)    Hypertension     Patient Active Problem List   Diagnosis Date Noted   Hypertension associated with diabetes (HCC) 07/14/2024   Pulmonary emphysema (HCC) 05/13/2022   Aortic atherosclerosis 05/13/2022   Dupuytren's contracture of both hands 01/19/2022   Combined forms of age-related cataract of left eye 12/28/2020   Regular astigmatism, left eye 12/28/2020   Recurrent right inguinal hernia 12/16/2020   Presbyopia 12/07/2020   Regular astigmatism, right eye 12/07/2020   Combined forms of age-related cataract of right eye 12/07/2020   Neuroforaminal stenosis of cervical spine 04/26/2018   Degenerative disc disease, cervical 04/26/2018   Left hip pain 04/26/2018   Cervical radiculitis 07/09/2014   Benign essential tremor 08/31/2011   Hyperlipidemia associated with type 2 diabetes mellitus (HCC) 08/31/2011   Type 2 diabetes mellitus (HCC) 01/07/2010   COLONIC POLYPS 01/01/2009   Diverticulosis of colon 01/01/2009   Former smoker 12/14/2008    Past Surgical History:  Procedure Laterality Date   EYE SURGERY     HERNIA REPAIR  11/21/2003   double       Home Medications    Prior to Admission medications   Medication Sig Start Date End Date Taking? Authorizing Provider  guaiFENesin (MUCINEX) 600 MG 12 hr tablet Take 2 tablets (1,200 mg total) by mouth 2 (two) times daily. 08/18/24  Yes Gini Caputo,  PA-C  ACCU-CHEK AVIVA PLUS test strip USE 1 STRIP TO CHECK GLUCOSE TWICE DAILY 07/18/24   Willo Mini, NP  AMBULATORY NON FORMULARY MEDICATION One Touch Verio glucose monitor test strips and lancets.  Check blood sugar daily. Dx type 2 diabetes. E11.9 03/27/16   Hessie Mallick, DO  enalapril  (VASOTEC ) 10 MG tablet Take 1 tablet (10 mg total) by mouth daily. 12/28/23   Willo Mini, NP  lovastatin  (MEVACOR ) 40 MG tablet Take 2 tablets (80 mg total) by mouth at bedtime. 11/06/23   Willo Mini, NP  metFORMIN  (GLUCOPHAGE -XR) 750 MG 24 hr tablet Take 1 tablet (750 mg total) by mouth 2 (two) times daily. 07/14/24   Willo Mini, NP  primidone  (MYSOLINE ) 250 MG tablet Take 250 mg by mouth at bedtime.    [provider]  primidone  (MYSOLINE ) 50 MG tablet Take by mouth at bedtime.    [provider]  propranolol (INDERAL) 10 MG tablet Take 10 mg by mouth in the morning and at bedtime. 08/05/20   [provider]    Family History Family History  Problem Relation Age of Onset   Cancer Father 30       colon    Social History Social History   Tobacco Use   Smoking status: Former    Current packs/day: 0.00    Average packs/day: 1 pack/day for 30.0 years (30.0 ttl pk-yrs)    Types: Cigarettes    Start date: 10/01/1979  Quit date: 09/30/2009    Years since quitting: 14.8   Smokeless tobacco: Never  Vaping Use   Vaping status: Never Used  Substance Use Topics   Alcohol use: Yes    Alcohol/week: 5.0 standard drinks of alcohol    Types: 1 Cans of beer, 4 Standard drinks or equivalent per week    Comment: occasionally   Drug use: Never     Allergies   Patient has no known allergies.   Review of Systems Review of Systems  As per HPI  Physical Exam Triage Vital Signs ED Triage Vitals  Encounter Vitals Group     BP 08/18/24 1002 (!) 150/79     Girls Systolic BP Percentile --      Girls Diastolic BP Percentile --      Boys Systolic BP Percentile --      Boys  Diastolic BP Percentile --      Pulse Rate 08/18/24 1002 73     Resp 08/18/24 1002 17     Temp 08/18/24 1002 98.5 F (36.9 C)     Temp Source 08/18/24 1002 Oral     SpO2 08/18/24 1002 97 %     Weight --      Height --      Head Circumference --      Peak Flow --      Pain Score 08/18/24 1003 5     Pain Loc --      Pain Education --      Exclude from Growth Chart --    No data found.  Updated Vital Signs BP 131/81   Pulse 73   Temp 98.5 F (36.9 C) (Oral)   Resp 17   SpO2 97%   Visual Acuity Right Eye Distance:   Left Eye Distance:   Bilateral Distance:    Right Eye Near:   Left Eye Near:    Bilateral Near:     Physical Exam Vitals and nursing note reviewed.  Constitutional:      Appearance: He is not ill-appearing.  HENT:     Right Ear: Tympanic membrane and ear canal normal.     Left Ear: Tympanic membrane and ear canal normal.     Nose: Congestion present. No rhinorrhea.     Mouth/Throat:     Mouth: Mucous membranes are moist.     Pharynx: Oropharynx is clear. Posterior oropharyngeal erythema (mild) present. No oropharyngeal exudate.     Comments: No tonsils seen although no history of removal  Eyes:     Conjunctiva/sclera: Conjunctivae normal.  Cardiovascular:     Rate and Rhythm: Normal rate and regular rhythm.     Pulses: Normal pulses.     Heart sounds: Normal heart sounds.  Pulmonary:     Effort: Pulmonary effort is normal.     Breath sounds: Normal breath sounds.  Abdominal:     General: There is no distension.     Palpations: Abdomen is soft.     Tenderness: There is no abdominal tenderness.  Musculoskeletal:     Cervical back: Normal range of motion. No rigidity or tenderness.  Lymphadenopathy:     Cervical: No cervical adenopathy.  Skin:    General: Skin is warm and dry.  Neurological:     Mental Status: He is alert and oriented to person, place, and time.      UC Treatments / Results  Labs (all labs ordered are listed, but only  abnormal results are displayed) Labs Reviewed  CULTURE, GROUP  A STREP [facility])  POCT RAPID STREP A (OFFICE)  POC SARS CORONAVIRUS 2 AG -  ED    EKG   Radiology No results found.  Procedures Procedures (including critical care time)  Medications Ordered in UC Medications - No data to display  Initial Impression / Assessment and Plan / UC Course  I have reviewed the triage vital signs and the nursing notes.  Pertinent labs & imaging results that were available during my care of the patient were reviewed by me and considered in my medical decision making (see chart for details).  Afebrile, well appearing. Stable vitals.  COVID test is negative.  Rapid strep negative, will culture Discussed likely viral etiology, supportive care at home.  Advised OTC options.  Advised likely viral prognosis, reasons to return to clinic.  Patient is agreeable with this plan, no questions  Final Clinical Impressions(s) / UC Diagnoses   Final diagnoses:  Sore throat  Viral upper respiratory tract infection     Discharge Instructions      I recommend plain mucinex (guaifenesin) twice daily. Take with LOTS of water and fluids! This is a really good medication for congestion.  You can continue to use tylenol for sore throat.  Also try salt water gargles, lozenges, or throat spray Allow 4-5 more days for improvement. If symptoms persist or worsen, please return      ED Prescriptions     Medication Sig Dispense Auth. Provider   guaiFENesin (MUCINEX) 600 MG 12 hr tablet Take 2 tablets (1,200 mg total) by mouth 2 (two) times daily. 30 tablet Deniya Craigo, Asberry, PA-C      PDMP not reviewed this encounter.   Jeryl Asberry, PA-C 08/18/24 1044

## 2024-08-18 NOTE — Discharge Instructions (Addendum)
 I recommend plain mucinex (guaifenesin) twice daily. Take with LOTS of water and fluids! This is a really good medication for congestion.  You can continue to use tylenol for sore throat.  Also try salt water gargles, lozenges, or throat spray Allow 4-5 more days for improvement. If symptoms persist or worsen, please return

## 2024-08-20 LAB — CULTURE, GROUP A STREP (THRC)

## 2024-08-27 NOTE — Progress Notes (Signed)
 Patrick Perkins                                          MRN: [id number]   08/27/2024   The [facility] Quality Team Specialist reviewed this patient medical record for the purposes of chart review for care gap closure. The following were reviewed: abstraction for care gap closure-kidney health evaluation for diabetes:eGFR  and uACR.    [facility]I Quality Team

## 2024-09-03 ENCOUNTER — Other Ambulatory Visit: Payer: Self-pay | Admitting: Medical-Surgical

## 2024-09-03 DIAGNOSIS — E119 Type 2 diabetes mellitus without complications: Secondary | ICD-10-CM

## 2024-10-03 ENCOUNTER — Encounter: Payer: Self-pay | Admitting: Podiatry

## 2024-10-03 ENCOUNTER — Ambulatory Visit: Admitting: Podiatry

## 2024-10-03 DIAGNOSIS — E1142 Type 2 diabetes mellitus with diabetic polyneuropathy: Secondary | ICD-10-CM

## 2024-10-03 DIAGNOSIS — M79675 Pain in left toe(s): Secondary | ICD-10-CM

## 2024-10-03 DIAGNOSIS — B351 Tinea unguium: Secondary | ICD-10-CM

## 2024-10-03 DIAGNOSIS — M79674 Pain in right toe(s): Secondary | ICD-10-CM

## 2024-10-03 NOTE — Progress Notes (Signed)
  Subjective:  Patient ID: Patrick Perkins, male    DOB: 1946-05-02,   MRN: [id number]  Chief Complaint  Patient presents with   Diabetes    My big toe on my left foot is painful. (Left medial border)  Saw Zada Palin, NP - 07/14/2024; A1c - 7.8    78 y.o. male presents for concern of thickened elongated and painful nails that are difficult to trim. Particularly the left great toe. Requesting to have them trimmed today. Relates burning and tingling in their feet. Patient is diabetic and last A1c was  Lab Results  Component Value Date   HGBA1C 7.8 (A) 07/14/2024   HGBA1C 7.8 (A) 07/14/2024   .   PCP:  Palin Zada, NP    . Denies any other pedal complaints. Denies n/v/f/c.   Past Medical History:  Diagnosis Date   Diabetes mellitus    ED (erectile dysfunction)    Hypertension     Objective:  Physical Exam: Vascular: DP/PT pulses 2/4 bilateral. CFT <3 seconds. Feet cold to touch. Absent hair growth on digits. Edema noted to bilateral lower extremities. Xerosis noted bilaterally.  Skin. No lacerations or abrasions bilateral feet. Nails 1-5 bilateral  are thickened discolored and elongated with subungual debris. Left hallux nail lateral border incurvated.  Musculoskeletal: MMT 5/5 bilateral lower extremities in DF, PF, Inversion and Eversion. Deceased ROM in DF of ankle joint.  Neurological: Sensation intact to light touch. Protective sensation diminished bilateral.    Assessment:   1. Pain due to onychomycosis of toenails of both feet   2. Type 2 diabetes mellitus with peripheral neuropathy (HCC)      Plan:  Patient was evaluated and treated and all questions answered. -Discussed and educated patient on diabetic foot care, especially with  regards to the vascular, neurological and musculoskeletal systems.  -Stressed the importance of good glycemic control and the detriment of not  controlling glucose levels in relation to the foot. -Discussed supportive shoes at all times  and checking feet regularly.  -Mechanically debrided all nails 1-5 bilateral using sterile nail nipper and filed with dremel without incident  -Discussed possibility of removing ingrown toenail in the future.  Discussed given his A1c above 7.5 at this time risk of healing it is a concern so we will hold off for now. -Answered all patient questions -Patient to return  in 3 months for at risk foot care -Patient advised to call the office if any problems or questions arise in the meantime.   Asberry Failing, DPM

## 2024-10-14 ENCOUNTER — Ambulatory Visit (INDEPENDENT_AMBULATORY_CARE_PROVIDER_SITE_OTHER): Admitting: Medical-Surgical

## 2024-10-14 ENCOUNTER — Encounter: Payer: Self-pay | Admitting: Medical-Surgical

## 2024-10-14 VITALS — BP 152/81 | HR 64 | Resp 20 | Ht 70.0 in | Wt 175.9 lb

## 2024-10-14 DIAGNOSIS — E1159 Type 2 diabetes mellitus with other circulatory complications: Secondary | ICD-10-CM

## 2024-10-14 DIAGNOSIS — I152 Hypertension secondary to endocrine disorders: Secondary | ICD-10-CM

## 2024-10-14 DIAGNOSIS — E119 Type 2 diabetes mellitus without complications: Secondary | ICD-10-CM

## 2024-10-14 DIAGNOSIS — I10 Essential (primary) hypertension: Secondary | ICD-10-CM

## 2024-10-14 LAB — POCT GLYCOSYLATED HEMOGLOBIN (HGB A1C)
HbA1c, POC (controlled diabetic range): 7.9 % — AB (ref 0.0–7.0)
Hemoglobin A1C: 7.9 % — AB (ref 4.0–5.6)

## 2024-10-14 MED ORDER — RYBELSUS 3 MG PO TABS: 3.0000 mg | ORAL_TABLET | Freq: Every day | ORAL | Status: AC

## 2024-10-14 MED ORDER — RYBELSUS 7 MG PO TABS: 7.0000 mg | ORAL_TABLET | Freq: Every day | ORAL | 0 refills | Status: AC

## 2024-10-14 NOTE — Progress Notes (Signed)
 Hypertension associated with diabetes (HCC) Taking enalapril  10mg  daily. Blood pressure elevated on arrival and again on recheck. Patient had left the clinic by the time the repeat reading was seen. MyChart message sent with clarifying questions. Pending reply.   Medical screening examination/treatment was performed by qualified clinical staff member and as supervising provider I was immediately available for consultation/collaboration. I have reviewed documentation and agree with assessment and plan.  Zada FREDRIK Palin, DNP, APRN, FNP-BC [facility]

## 2024-10-14 NOTE — Progress Notes (Signed)
   Established Patient Office Visit  Subjective   Patient ID: Patrick Perkins, male    DOB: [birth year]  Age: 78 y.o. MRN: [id number]  Chief Complaint  Patient presents with   Diabetes    Diabetes    78 year old male presents for 3 month follow up on his Type 2 Diabetes   Patient was prescribed Metformin  750 mg daily. He states that he stopped taking his Metformin  in mid September. Reports that he experienced diarrhea and abdominal pain with the Metformin . He does check his blood sugars daily fasting with an average reading around 180. He reports eating out more often recently due to travel. Denies having any episodes of hypoglycemia. A1C in Aug was 7.8%.   Review of Systems  Constitutional: Negative.   HENT: Negative.    Eyes: Negative.   Respiratory: Negative.    Cardiovascular: Negative.   Gastrointestinal: Negative.   Genitourinary: Negative.   Musculoskeletal: Negative.   Skin: Negative.   Neurological: Negative.   Endo/Heme/Allergies: Negative.   Psychiatric/Behavioral: Negative.        Objective:     BP (!) 155/75 (BP Location: Right Arm, Cuff Size: Normal)   Pulse 63   Resp 20   Ht 5' 10 (1.778 m)   Wt 79.8 kg   SpO2 98%   BMI 25.24 kg/m  BP Readings from Last 3 Encounters:  10/14/24 (!) 155/75  08/18/24 131/81  07/14/24 (!) 149/75      Physical Exam Vitals and nursing note reviewed.  Constitutional:      General: He is not in acute distress.    Appearance: Normal appearance.  Cardiovascular:     Rate and Rhythm: Normal rate and regular rhythm.     Pulses: Normal pulses.     Heart sounds: Normal heart sounds.  Pulmonary:     Effort: Pulmonary effort is normal.     Breath sounds: Normal breath sounds.  Neurological:     General: No focal deficit present.     Mental Status: He is alert and oriented to person, place, and time.  Psychiatric:        Mood and Affect: Mood normal.        Behavior: Behavior normal.        Thought Content:  Thought content normal.        Judgment: Judgment normal.     No results found for any visits on 10/14/24.  Last hemoglobin A1c Lab Results  Component Value Date   HGBA1C 7.8 (A) 07/14/2024   HGBA1C 7.8 (A) 07/14/2024    The 10-year ASCVD risk score (Arnett DK, et al., 2019) is: 59%    Assessment & Plan:   1. Type 2 diabetes mellitus without complication, without long-term current use of insulin (HCC) (Primary) - Patient d/c metformin  - Will trial Rybelsus  starting with 3 mg tablets daily for 1 month and then increase to 7 mg daily - Follow up in 3 months - Previous A1C 7.8% - POCT A1C today 7.9% - Semaglutide  (RYBELSUS ) 7 MG TABS; Take 1 tablet (7 mg total) by mouth daily.  Dispense: 90 tablet; Refill: 0 - Semaglutide  (RYBELSUS ) 3 MG TABS; Take 1 tablet (3 mg total) by mouth daily.   Return in about 3 months (around 01/14/2025) for DM follow up.   Derrek JINNY Freund, NP Student

## 2024-10-15 MED ORDER — ENALAPRIL MALEATE 20 MG PO TABS
20.0000 mg | ORAL_TABLET | Freq: Every day | ORAL | 3 refills | Status: AC
Start: 2024-10-15 — End: ?

## 2024-11-04 ENCOUNTER — Other Ambulatory Visit: Payer: Self-pay | Admitting: Medical-Surgical

## 2024-11-04 DIAGNOSIS — E1169 Type 2 diabetes mellitus with other specified complication: Secondary | ICD-10-CM

## 2024-11-06 ENCOUNTER — Encounter: Payer: Self-pay | Admitting: Medical-Surgical

## 2025-01-02 ENCOUNTER — Ambulatory Visit: Admitting: Podiatry

## 2025-01-14 ENCOUNTER — Ambulatory Visit: Admitting: Medical-Surgical
